# Patient Record
Sex: Female | Born: 1935
Health system: Southern US, Community
[De-identification: ages and names within clinical notes are randomized; demographics above are authoritative.]

## PROBLEM LIST (undated history)

## (undated) DIAGNOSIS — H9319 Tinnitus, unspecified ear: Secondary | ICD-10-CM

## (undated) DIAGNOSIS — I509 Heart failure, unspecified: Secondary | ICD-10-CM

## (undated) DIAGNOSIS — I4819 Other persistent atrial fibrillation: Secondary | ICD-10-CM

## (undated) DIAGNOSIS — I10 Essential (primary) hypertension: Secondary | ICD-10-CM

## (undated) HISTORY — PX: OTHER SURGICAL HISTORY: SHX169

## (undated) HISTORY — DX: Other persistent atrial fibrillation: I48.19

## (undated) HISTORY — PX: ATRIAL FIBRILLATION ABLATION: EP1191

## (undated) HISTORY — DX: Essential (primary) hypertension: I10

## (undated) HISTORY — PX: APPENDECTOMY: SHX54

## (undated) HISTORY — DX: Tinnitus, unspecified ear: H93.19

---

## 2015-08-03 DIAGNOSIS — I480 Paroxysmal atrial fibrillation: Secondary | ICD-10-CM | POA: Diagnosis not present

## 2015-08-31 DIAGNOSIS — I480 Paroxysmal atrial fibrillation: Secondary | ICD-10-CM | POA: Diagnosis not present

## 2015-10-08 DIAGNOSIS — H348392 Tributary (branch) retinal vein occlusion, unspecified eye, stable: Secondary | ICD-10-CM | POA: Diagnosis not present

## 2015-10-08 DIAGNOSIS — H04123 Dry eye syndrome of bilateral lacrimal glands: Secondary | ICD-10-CM | POA: Diagnosis not present

## 2015-10-08 DIAGNOSIS — H26493 Other secondary cataract, bilateral: Secondary | ICD-10-CM | POA: Diagnosis not present

## 2015-10-18 DIAGNOSIS — I480 Paroxysmal atrial fibrillation: Secondary | ICD-10-CM | POA: Diagnosis not present

## 2015-10-28 DIAGNOSIS — I482 Chronic atrial fibrillation: Secondary | ICD-10-CM | POA: Diagnosis not present

## 2015-10-28 DIAGNOSIS — I10 Essential (primary) hypertension: Secondary | ICD-10-CM | POA: Diagnosis not present

## 2015-10-28 DIAGNOSIS — N3281 Overactive bladder: Secondary | ICD-10-CM | POA: Diagnosis not present

## 2015-10-28 DIAGNOSIS — F329 Major depressive disorder, single episode, unspecified: Secondary | ICD-10-CM | POA: Diagnosis not present

## 2015-10-29 DIAGNOSIS — I10 Essential (primary) hypertension: Secondary | ICD-10-CM | POA: Diagnosis not present

## 2015-10-29 DIAGNOSIS — I480 Paroxysmal atrial fibrillation: Secondary | ICD-10-CM | POA: Diagnosis not present

## 2015-10-29 DIAGNOSIS — I483 Typical atrial flutter: Secondary | ICD-10-CM | POA: Diagnosis not present

## 2015-11-17 DIAGNOSIS — I480 Paroxysmal atrial fibrillation: Secondary | ICD-10-CM | POA: Diagnosis not present

## 2015-11-19 DIAGNOSIS — H26493 Other secondary cataract, bilateral: Secondary | ICD-10-CM | POA: Diagnosis not present

## 2015-12-14 DIAGNOSIS — I480 Paroxysmal atrial fibrillation: Secondary | ICD-10-CM | POA: Diagnosis not present

## 2015-12-28 DIAGNOSIS — H26493 Other secondary cataract, bilateral: Secondary | ICD-10-CM | POA: Diagnosis not present

## 2015-12-28 DIAGNOSIS — H04123 Dry eye syndrome of bilateral lacrimal glands: Secondary | ICD-10-CM | POA: Diagnosis not present

## 2016-01-12 DIAGNOSIS — I480 Paroxysmal atrial fibrillation: Secondary | ICD-10-CM | POA: Diagnosis not present

## 2016-02-11 DIAGNOSIS — R300 Dysuria: Secondary | ICD-10-CM | POA: Diagnosis not present

## 2016-02-11 DIAGNOSIS — N390 Urinary tract infection, site not specified: Secondary | ICD-10-CM | POA: Diagnosis not present

## 2016-02-11 DIAGNOSIS — F329 Major depressive disorder, single episode, unspecified: Secondary | ICD-10-CM | POA: Diagnosis not present

## 2016-02-11 DIAGNOSIS — I482 Chronic atrial fibrillation: Secondary | ICD-10-CM | POA: Diagnosis not present

## 2016-02-11 DIAGNOSIS — I10 Essential (primary) hypertension: Secondary | ICD-10-CM | POA: Diagnosis not present

## 2016-03-01 DIAGNOSIS — R740 Nonspecific elevation of levels of transaminase and lactic acid dehydrogenase [LDH]: Secondary | ICD-10-CM | POA: Diagnosis not present

## 2016-03-01 DIAGNOSIS — I480 Paroxysmal atrial fibrillation: Secondary | ICD-10-CM | POA: Diagnosis not present

## 2016-03-09 DIAGNOSIS — H4312 Vitreous hemorrhage, left eye: Secondary | ICD-10-CM | POA: Diagnosis not present

## 2016-03-09 DIAGNOSIS — Z961 Presence of intraocular lens: Secondary | ICD-10-CM | POA: Diagnosis not present

## 2016-03-09 DIAGNOSIS — H348321 Tributary (branch) retinal vein occlusion, left eye, with retinal neovascularization: Secondary | ICD-10-CM | POA: Diagnosis not present

## 2016-03-09 DIAGNOSIS — H353131 Nonexudative age-related macular degeneration, bilateral, early dry stage: Secondary | ICD-10-CM | POA: Diagnosis not present

## 2016-03-16 DIAGNOSIS — D1803 Hemangioma of intra-abdominal structures: Secondary | ICD-10-CM | POA: Diagnosis not present

## 2016-03-16 DIAGNOSIS — R109 Unspecified abdominal pain: Secondary | ICD-10-CM | POA: Diagnosis not present

## 2016-03-16 DIAGNOSIS — D490 Neoplasm of unspecified behavior of digestive system: Secondary | ICD-10-CM | POA: Diagnosis not present

## 2016-03-16 DIAGNOSIS — Z96643 Presence of artificial hip joint, bilateral: Secondary | ICD-10-CM | POA: Diagnosis not present

## 2016-03-16 DIAGNOSIS — D689 Coagulation defect, unspecified: Secondary | ICD-10-CM | POA: Diagnosis not present

## 2016-03-16 DIAGNOSIS — Z743 Need for continuous supervision: Secondary | ICD-10-CM | POA: Diagnosis not present

## 2016-03-16 DIAGNOSIS — I4891 Unspecified atrial fibrillation: Secondary | ICD-10-CM | POA: Diagnosis not present

## 2016-03-16 DIAGNOSIS — R531 Weakness: Secondary | ICD-10-CM | POA: Diagnosis not present

## 2016-03-16 DIAGNOSIS — R509 Fever, unspecified: Secondary | ICD-10-CM | POA: Diagnosis not present

## 2016-03-16 DIAGNOSIS — R110 Nausea: Secondary | ICD-10-CM | POA: Diagnosis not present

## 2016-03-16 DIAGNOSIS — D72829 Elevated white blood cell count, unspecified: Secondary | ICD-10-CM | POA: Diagnosis not present

## 2016-03-16 DIAGNOSIS — R740 Nonspecific elevation of levels of transaminase and lactic acid dehydrogenase [LDH]: Secondary | ICD-10-CM | POA: Diagnosis not present

## 2016-03-16 DIAGNOSIS — Z9181 History of falling: Secondary | ICD-10-CM | POA: Diagnosis not present

## 2016-03-16 DIAGNOSIS — R0600 Dyspnea, unspecified: Secondary | ICD-10-CM | POA: Diagnosis not present

## 2016-03-16 DIAGNOSIS — I10 Essential (primary) hypertension: Secondary | ICD-10-CM | POA: Diagnosis not present

## 2016-03-16 DIAGNOSIS — J9811 Atelectasis: Secondary | ICD-10-CM | POA: Diagnosis not present

## 2016-03-16 DIAGNOSIS — R748 Abnormal levels of other serum enzymes: Secondary | ICD-10-CM | POA: Diagnosis not present

## 2016-03-16 DIAGNOSIS — I2510 Atherosclerotic heart disease of native coronary artery without angina pectoris: Secondary | ICD-10-CM | POA: Diagnosis not present

## 2016-03-16 DIAGNOSIS — K7589 Other specified inflammatory liver diseases: Secondary | ICD-10-CM | POA: Diagnosis not present

## 2016-03-16 DIAGNOSIS — F329 Major depressive disorder, single episode, unspecified: Secondary | ICD-10-CM | POA: Diagnosis not present

## 2016-03-16 DIAGNOSIS — R7989 Other specified abnormal findings of blood chemistry: Secondary | ICD-10-CM | POA: Diagnosis not present

## 2016-03-16 DIAGNOSIS — R932 Abnormal findings on diagnostic imaging of liver and biliary tract: Secondary | ICD-10-CM | POA: Diagnosis not present

## 2016-03-16 DIAGNOSIS — B349 Viral infection, unspecified: Secondary | ICD-10-CM | POA: Diagnosis not present

## 2016-03-24 DIAGNOSIS — M79674 Pain in right toe(s): Secondary | ICD-10-CM | POA: Diagnosis not present

## 2016-03-24 DIAGNOSIS — H353131 Nonexudative age-related macular degeneration, bilateral, early dry stage: Secondary | ICD-10-CM | POA: Diagnosis not present

## 2016-03-24 DIAGNOSIS — B351 Tinea unguium: Secondary | ICD-10-CM | POA: Diagnosis not present

## 2016-03-24 DIAGNOSIS — I739 Peripheral vascular disease, unspecified: Secondary | ICD-10-CM | POA: Diagnosis not present

## 2016-03-24 DIAGNOSIS — H348321 Tributary (branch) retinal vein occlusion, left eye, with retinal neovascularization: Secondary | ICD-10-CM | POA: Diagnosis not present

## 2016-03-24 DIAGNOSIS — H4312 Vitreous hemorrhage, left eye: Secondary | ICD-10-CM | POA: Diagnosis not present

## 2016-03-24 DIAGNOSIS — M79675 Pain in left toe(s): Secondary | ICD-10-CM | POA: Diagnosis not present

## 2016-03-24 DIAGNOSIS — Z7901 Long term (current) use of anticoagulants: Secondary | ICD-10-CM | POA: Diagnosis not present

## 2016-03-29 DIAGNOSIS — F329 Major depressive disorder, single episode, unspecified: Secondary | ICD-10-CM | POA: Diagnosis not present

## 2016-03-29 DIAGNOSIS — I482 Chronic atrial fibrillation: Secondary | ICD-10-CM | POA: Diagnosis not present

## 2016-03-29 DIAGNOSIS — I480 Paroxysmal atrial fibrillation: Secondary | ICD-10-CM | POA: Diagnosis not present

## 2016-03-29 DIAGNOSIS — R296 Repeated falls: Secondary | ICD-10-CM | POA: Diagnosis not present

## 2016-03-29 DIAGNOSIS — R740 Nonspecific elevation of levels of transaminase and lactic acid dehydrogenase [LDH]: Secondary | ICD-10-CM | POA: Diagnosis not present

## 2016-03-29 DIAGNOSIS — I10 Essential (primary) hypertension: Secondary | ICD-10-CM | POA: Diagnosis not present

## 2016-03-29 DIAGNOSIS — R2681 Unsteadiness on feet: Secondary | ICD-10-CM | POA: Diagnosis not present

## 2016-04-07 DIAGNOSIS — H4312 Vitreous hemorrhage, left eye: Secondary | ICD-10-CM | POA: Diagnosis not present

## 2016-04-07 DIAGNOSIS — H348321 Tributary (branch) retinal vein occlusion, left eye, with retinal neovascularization: Secondary | ICD-10-CM | POA: Diagnosis not present

## 2016-04-07 DIAGNOSIS — H353131 Nonexudative age-related macular degeneration, bilateral, early dry stage: Secondary | ICD-10-CM | POA: Diagnosis not present

## 2016-04-26 DIAGNOSIS — H348321 Tributary (branch) retinal vein occlusion, left eye, with retinal neovascularization: Secondary | ICD-10-CM | POA: Diagnosis not present

## 2016-04-26 DIAGNOSIS — H5372 Impaired contrast sensitivity: Secondary | ICD-10-CM | POA: Diagnosis not present

## 2016-04-26 DIAGNOSIS — H5371 Glare sensitivity: Secondary | ICD-10-CM | POA: Diagnosis not present

## 2016-04-26 DIAGNOSIS — H353131 Nonexudative age-related macular degeneration, bilateral, early dry stage: Secondary | ICD-10-CM | POA: Diagnosis not present

## 2016-04-26 DIAGNOSIS — H04123 Dry eye syndrome of bilateral lacrimal glands: Secondary | ICD-10-CM | POA: Diagnosis not present

## 2016-05-03 DIAGNOSIS — I480 Paroxysmal atrial fibrillation: Secondary | ICD-10-CM | POA: Diagnosis not present

## 2016-05-18 DIAGNOSIS — R351 Nocturia: Secondary | ICD-10-CM | POA: Diagnosis not present

## 2016-05-18 DIAGNOSIS — I482 Chronic atrial fibrillation: Secondary | ICD-10-CM | POA: Diagnosis not present

## 2016-05-18 DIAGNOSIS — I10 Essential (primary) hypertension: Secondary | ICD-10-CM | POA: Diagnosis not present

## 2016-05-18 DIAGNOSIS — F329 Major depressive disorder, single episode, unspecified: Secondary | ICD-10-CM | POA: Diagnosis not present

## 2016-06-09 DIAGNOSIS — I480 Paroxysmal atrial fibrillation: Secondary | ICD-10-CM | POA: Diagnosis not present

## 2016-07-04 DIAGNOSIS — L821 Other seborrheic keratosis: Secondary | ICD-10-CM | POA: Diagnosis not present

## 2016-07-04 DIAGNOSIS — D485 Neoplasm of uncertain behavior of skin: Secondary | ICD-10-CM | POA: Diagnosis not present

## 2016-07-04 DIAGNOSIS — L570 Actinic keratosis: Secondary | ICD-10-CM | POA: Diagnosis not present

## 2016-07-04 DIAGNOSIS — C44611 Basal cell carcinoma of skin of unspecified upper limb, including shoulder: Secondary | ICD-10-CM | POA: Diagnosis not present

## 2016-07-06 DIAGNOSIS — I480 Paroxysmal atrial fibrillation: Secondary | ICD-10-CM | POA: Diagnosis not present

## 2016-08-07 DIAGNOSIS — C44611 Basal cell carcinoma of skin of unspecified upper limb, including shoulder: Secondary | ICD-10-CM | POA: Diagnosis not present

## 2016-08-07 DIAGNOSIS — D1801 Hemangioma of skin and subcutaneous tissue: Secondary | ICD-10-CM | POA: Diagnosis not present

## 2016-08-07 DIAGNOSIS — L821 Other seborrheic keratosis: Secondary | ICD-10-CM | POA: Diagnosis not present

## 2016-08-11 DIAGNOSIS — I480 Paroxysmal atrial fibrillation: Secondary | ICD-10-CM | POA: Diagnosis not present

## 2016-08-11 DIAGNOSIS — I482 Chronic atrial fibrillation: Secondary | ICD-10-CM | POA: Diagnosis not present

## 2016-08-11 DIAGNOSIS — I10 Essential (primary) hypertension: Secondary | ICD-10-CM | POA: Diagnosis not present

## 2016-08-11 DIAGNOSIS — N3281 Overactive bladder: Secondary | ICD-10-CM | POA: Diagnosis not present

## 2016-09-11 DIAGNOSIS — I480 Paroxysmal atrial fibrillation: Secondary | ICD-10-CM | POA: Diagnosis not present

## 2016-09-13 DIAGNOSIS — R5381 Other malaise: Secondary | ICD-10-CM | POA: Diagnosis not present

## 2016-09-13 DIAGNOSIS — Z87891 Personal history of nicotine dependence: Secondary | ICD-10-CM | POA: Diagnosis not present

## 2016-09-13 DIAGNOSIS — I4892 Unspecified atrial flutter: Secondary | ICD-10-CM | POA: Diagnosis not present

## 2016-09-13 DIAGNOSIS — M4800 Spinal stenosis, site unspecified: Secondary | ICD-10-CM | POA: Diagnosis not present

## 2016-09-13 DIAGNOSIS — I480 Paroxysmal atrial fibrillation: Secondary | ICD-10-CM | POA: Diagnosis not present

## 2016-09-13 DIAGNOSIS — R791 Abnormal coagulation profile: Secondary | ICD-10-CM | POA: Diagnosis not present

## 2016-09-13 DIAGNOSIS — J40 Bronchitis, not specified as acute or chronic: Secondary | ICD-10-CM | POA: Diagnosis not present

## 2016-09-13 DIAGNOSIS — I110 Hypertensive heart disease with heart failure: Secondary | ICD-10-CM | POA: Diagnosis not present

## 2016-09-13 DIAGNOSIS — I5033 Acute on chronic diastolic (congestive) heart failure: Secondary | ICD-10-CM | POA: Diagnosis not present

## 2016-09-13 DIAGNOSIS — Z743 Need for continuous supervision: Secondary | ICD-10-CM | POA: Diagnosis not present

## 2016-09-13 DIAGNOSIS — Z7901 Long term (current) use of anticoagulants: Secondary | ICD-10-CM | POA: Diagnosis not present

## 2016-09-13 DIAGNOSIS — M810 Age-related osteoporosis without current pathological fracture: Secondary | ICD-10-CM | POA: Diagnosis not present

## 2016-09-13 DIAGNOSIS — I509 Heart failure, unspecified: Secondary | ICD-10-CM | POA: Diagnosis not present

## 2016-09-13 DIAGNOSIS — Z8711 Personal history of peptic ulcer disease: Secondary | ICD-10-CM | POA: Diagnosis not present

## 2016-09-14 DIAGNOSIS — I517 Cardiomegaly: Secondary | ICD-10-CM | POA: Diagnosis not present

## 2016-09-14 DIAGNOSIS — I081 Rheumatic disorders of both mitral and tricuspid valves: Secondary | ICD-10-CM | POA: Diagnosis not present

## 2016-09-14 DIAGNOSIS — I272 Pulmonary hypertension, unspecified: Secondary | ICD-10-CM | POA: Diagnosis not present

## 2016-09-22 DIAGNOSIS — M79675 Pain in left toe(s): Secondary | ICD-10-CM | POA: Diagnosis not present

## 2016-09-22 DIAGNOSIS — I739 Peripheral vascular disease, unspecified: Secondary | ICD-10-CM | POA: Diagnosis not present

## 2016-09-22 DIAGNOSIS — B351 Tinea unguium: Secondary | ICD-10-CM | POA: Diagnosis not present

## 2016-09-22 DIAGNOSIS — M79674 Pain in right toe(s): Secondary | ICD-10-CM | POA: Diagnosis not present

## 2016-09-22 DIAGNOSIS — Z7901 Long term (current) use of anticoagulants: Secondary | ICD-10-CM | POA: Diagnosis not present

## 2016-09-29 DIAGNOSIS — I10 Essential (primary) hypertension: Secondary | ICD-10-CM | POA: Diagnosis not present

## 2016-09-29 DIAGNOSIS — I480 Paroxysmal atrial fibrillation: Secondary | ICD-10-CM | POA: Diagnosis not present

## 2016-09-29 DIAGNOSIS — I483 Typical atrial flutter: Secondary | ICD-10-CM | POA: Diagnosis not present

## 2016-11-20 DIAGNOSIS — I480 Paroxysmal atrial fibrillation: Secondary | ICD-10-CM | POA: Diagnosis not present

## 2016-11-20 DIAGNOSIS — H9313 Tinnitus, bilateral: Secondary | ICD-10-CM | POA: Diagnosis not present

## 2016-11-20 DIAGNOSIS — F330 Major depressive disorder, recurrent, mild: Secondary | ICD-10-CM | POA: Diagnosis not present

## 2016-11-20 DIAGNOSIS — I10 Essential (primary) hypertension: Secondary | ICD-10-CM | POA: Diagnosis not present

## 2016-12-22 DIAGNOSIS — F331 Major depressive disorder, recurrent, moderate: Secondary | ICD-10-CM | POA: Diagnosis not present

## 2016-12-26 DIAGNOSIS — I480 Paroxysmal atrial fibrillation: Secondary | ICD-10-CM | POA: Diagnosis not present

## 2016-12-26 DIAGNOSIS — I10 Essential (primary) hypertension: Secondary | ICD-10-CM | POA: Diagnosis not present

## 2016-12-26 DIAGNOSIS — F330 Major depressive disorder, recurrent, mild: Secondary | ICD-10-CM | POA: Diagnosis not present

## 2016-12-26 DIAGNOSIS — G250 Essential tremor: Secondary | ICD-10-CM | POA: Diagnosis not present

## 2017-01-01 DIAGNOSIS — I10 Essential (primary) hypertension: Secondary | ICD-10-CM | POA: Diagnosis not present

## 2017-01-01 DIAGNOSIS — I480 Paroxysmal atrial fibrillation: Secondary | ICD-10-CM | POA: Diagnosis not present

## 2017-01-08 ENCOUNTER — Ambulatory Visit (HOSPITAL_COMMUNITY)
Admission: RE | Admit: 2017-01-08 | Discharge: 2017-01-08 | Disposition: A | Discharge status: Home / Self Care | Payer: Medicare Other | Source: Ambulatory Visit | Attending: Nurse Practitioner | Admitting: Nurse Practitioner

## 2017-01-08 ENCOUNTER — Encounter (HOSPITAL_COMMUNITY): Payer: Self-pay | Admitting: Nurse Practitioner

## 2017-01-08 VITALS — BP 90/58 | HR 73 | Ht 63.0 in | Wt 151.2 lb

## 2017-01-08 DIAGNOSIS — Z888 Allergy status to other drugs, medicaments and biological substances status: Secondary | ICD-10-CM | POA: Insufficient documentation

## 2017-01-08 DIAGNOSIS — Z7901 Long term (current) use of anticoagulants: Secondary | ICD-10-CM | POA: Diagnosis not present

## 2017-01-08 DIAGNOSIS — I4891 Unspecified atrial fibrillation: Secondary | ICD-10-CM | POA: Diagnosis not present

## 2017-01-08 DIAGNOSIS — I4892 Unspecified atrial flutter: Secondary | ICD-10-CM | POA: Diagnosis not present

## 2017-01-08 DIAGNOSIS — I481 Persistent atrial fibrillation: Secondary | ICD-10-CM

## 2017-01-08 DIAGNOSIS — Z9889 Other specified postprocedural states: Secondary | ICD-10-CM | POA: Diagnosis not present

## 2017-01-08 DIAGNOSIS — Z87891 Personal history of nicotine dependence: Secondary | ICD-10-CM | POA: Insufficient documentation

## 2017-01-08 DIAGNOSIS — Z96643 Presence of artificial hip joint, bilateral: Secondary | ICD-10-CM | POA: Diagnosis not present

## 2017-01-08 DIAGNOSIS — Z79899 Other long term (current) drug therapy: Secondary | ICD-10-CM | POA: Insufficient documentation

## 2017-01-08 DIAGNOSIS — I4819 Other persistent atrial fibrillation: Secondary | ICD-10-CM

## 2017-01-08 DIAGNOSIS — I509 Heart failure, unspecified: Secondary | ICD-10-CM | POA: Insufficient documentation

## 2017-01-08 HISTORY — DX: Heart failure, unspecified: I50.9

## 2017-01-08 LAB — CBC
HCT: 48 % — ABNORMAL HIGH (ref 36.0–46.0)
Hemoglobin: 15.8 g/dL — ABNORMAL HIGH (ref 12.0–15.0)
MCH: 30.4 pg (ref 26.0–34.0)
MCHC: 32.9 g/dL (ref 30.0–36.0)
MCV: 92.3 fL (ref 78.0–100.0)
Platelets: 249 10*3/uL (ref 150–400)
RBC: 5.2 MIL/uL — ABNORMAL HIGH (ref 3.87–5.11)
RDW: 14.4 % (ref 11.5–15.5)
WBC: 7.1 10*3/uL (ref 4.0–10.5)

## 2017-01-08 LAB — COMPREHENSIVE METABOLIC PANEL
ALT: 14 U/L (ref 14–54)
AST: 22 U/L (ref 15–41)
Albumin: 3.5 g/dL (ref 3.5–5.0)
Alkaline Phosphatase: 71 U/L (ref 38–126)
Anion gap: 6 (ref 5–15)
BUN: 22 mg/dL — ABNORMAL HIGH (ref 6–20)
CO2: 31 mmol/L (ref 22–32)
Calcium: 9 mg/dL (ref 8.9–10.3)
Chloride: 102 mmol/L (ref 101–111)
Creatinine, Ser: 0.91 mg/dL (ref 0.44–1.00)
GFR calc Af Amer: >60 mL/min (ref >60)
GFR calc non Af Amer: 55 mL/min — ABNORMAL LOW (ref >60)
Glucose, Bld: 86 mg/dL (ref 65–99)
Potassium: 3.7 mmol/L (ref 3.5–5.1)
Sodium: 139 mmol/L (ref 135–145)
Total Bilirubin: 1 mg/dL (ref 0.3–1.2)
Total Protein: 6.5 g/dL (ref 6.5–8.1)

## 2017-01-08 LAB — TSH: TSH: 3.373 u[IU]/mL (ref 0.350–4.500)

## 2017-01-08 NOTE — Patient Instructions (Signed)
pulmonary function tests ---Arrive at the main entrance of the hospital and ask to be taken to respiratory ---Arrive 15 mins prior to appointment time. ---No inhalers, smoking or caffeine 4 hours prior to the test ---you may eat and drink (other than caffeine)  Butch Penny will be in touch with you once all results of testing.

## 2017-01-09 NOTE — Progress Notes (Signed)
Primary Care Physician: Lajean Manes, MD Referring Physician: Dr. Wilhelmina Mcardle Stephanie Ritter is a 83 y.o. female with a h/o paroxysmal afib with remote afib ablation in the afib clinic for evaluation. The history comes mostly form the daughter. Her parents have recently moved here in the last couple of months from another state due to both having declinie in health.. The pt was in the hospital in February with ? Afib/fluid overload and per the daughter's history may have been in afib since that time with associated fatigue and shortness of breath. Her afib has been had to mange locally due to hypotension with rate control drugs. She is on eliquis with a chadsvasc score of at least 5.  Today, she denies symptoms of palpitations, chest pain, shortness of breath, orthopnea, PND, lower extremity edema, dizziness, presyncope, syncope, or neurologic sequela. Positive for fatigue and shortness of breath.The patient is tolerating medications without difficulties and is otherwise without complaint today.   Past Medical History:  Diagnosis Date  . Atrial fibrillation ([place])   . CHF (congestive heart failure) ([place])   . High blood pressure    Past Surgical History:  Procedure Laterality Date  . APPENDECTOMY     60 years ago  . ATRIAL FIBRILLATION ABLATION N/A    8 years ago  . hip replacement Bilateral    4 and 8 years ago    Current Outpatient Prescriptions  Medication Sig Dispense Refill  . apixaban (ELIQUIS) 5 MG TABS tablet Take 5 mg by mouth 2 (two) times daily.    Marland Kitchen diltiazem (CARDIZEM CD) 300 MG 24 hr capsule Take 300 mg by mouth daily.    Marland Kitchen escitalopram (LEXAPRO) 20 MG tablet 1 tablet daily.  11  . furosemide (LASIX) 40 MG tablet Take 40 mg by mouth daily.    . metoprolol tartrate (LOPRESSOR) 25 MG tablet Take 25 mg by mouth 2 (two) times daily.    . Multiple Vitamins-Minerals (PRESERVISION/LUTEIN) CAPS Take 1 capsule by mouth daily.     No current facility-administered medications  for this encounter.     Allergies  Allergen Reactions  . Asa [Aspirin]     Excessive bleeding    Social History   Social History  . Marital status: Married    Spouse name: N/A  . Number of children: N/A  . Years of education: N/A   Occupational History  . Not on file.   Social History Main Topics  . Smoking status: Former Smoker    Types: Cigarettes  . Smokeless tobacco: Never Used     Comment: Pt quit 50+ years ago  . Alcohol use 0.6 oz/week    1 Glasses of wine per week     Comment: occasionally  . Drug use: Unknown  . Sexual activity: Not on file   Other Topics Concern  . Not on file   Social History Narrative  . No narrative on file    No family history on file.  ROS- All systems are reviewed and negative except as per the HPI above  Physical Exam: Vitals:   01/08/17 1035  BP: (!) 90/58  Pulse: 73  Weight: 151 lb 3.2 oz (68.6 kg)  Height: 5\' 3"  (1.6 m)   Wt Readings from Last 3 Encounters:  01/08/17 151 lb 3.2 oz (68.6 kg)    Labs: Lab Results  Component Value Date   NA 139 01/08/2017   K 3.7 01/08/2017   CL 102 01/08/2017   CO2 31 01/08/2017  GLUCOSE 86 01/08/2017   BUN 22 (H) 01/08/2017   CREATININE 0.91 01/08/2017   CALCIUM 9.0 01/08/2017   No results found for: INR No results found for: CHOL, HDL, LDLCALC, TRIG   GEN- The patient is well appearing, alert and oriented x 3 today.   Head- normocephalic, atraumatic Eyes-  Sclera clear, conjunctiva pink Ears- hearing intact Oropharynx- clear Neck- supple, no JVP Lymph- no cervical lymphadenopathy Lungs- Clear to ausculation bilaterally, normal work of breathing Heart- irregular rate and rhythm, no murmurs, rubs or gallops, PMI not laterally displaced GI- soft, NT, ND, + BS Extremities- no clubbing, cyanosis, or edema MS- no significant deformity or atrophy Skin- no rash or lesion Psych- euthymic mood, full affect Neuro- strength and sensation are intact  EKG-aflutter with  variable AV block, v rate 73 bpm, qrs int 76 ms, qtc 425 ms Records reviewed from pcp    Assessment and Plan: 1. Afib/flutter Tolerating  poorly with hypotension, fatigue and DOE Discussed with daughter, pt and her husband trying to restore SR with options of amiodarone or tikosyn They are leaning toward amiodarone Will get baseline line labs including liver panel and TSH and schedule for PFT's, if baseline studies are wnl, will start amiodarone load 200 mg bid x 2 weeks with 200 mg a day x another two weeks and then plan for cardioversion Reminded not to miss any eliquis Medical records are being requested from her previous cardiologist, would like to see her echo results and full cardiac history In the interim continue current rate control  Will be back in contact when PFT results are known and start AAD at that time  Butch Penny C. Rilei Kravitz, [facility] [address] [place], [place] [address] [phone]

## 2017-01-12 ENCOUNTER — Ambulatory Visit (HOSPITAL_COMMUNITY)
Admission: RE | Admit: 2017-01-12 | Discharge: 2017-01-12 | Disposition: A | Discharge status: Home / Self Care | Payer: Medicare Other | Source: Ambulatory Visit | Attending: Nurse Practitioner | Admitting: Nurse Practitioner

## 2017-01-12 DIAGNOSIS — I481 Persistent atrial fibrillation: Secondary | ICD-10-CM | POA: Insufficient documentation

## 2017-01-12 DIAGNOSIS — I4819 Other persistent atrial fibrillation: Secondary | ICD-10-CM

## 2017-01-12 LAB — PULMONARY FUNCTION TEST
DL/VA % pred: 71 %
DL/VA: 3.28 ml/min/mmHg/L
DLCO cor % pred: 56 %
DLCO cor: 12.48 ml/min/mmHg
DLCO unc % pred: 59 %
DLCO unc: 13.31 ml/min/mmHg
FEF 25-75 Post: 2.39 L/sec
FEF 25-75 Pre: 1.71 L/sec
FEF2575-%Change-Post: 39 %
FEF2575-%Pred-Post: 268 %
FEF2575-%Pred-Pre: 192 %
FEV1-%Change-Post: 5 %
FEV1-%Pred-Post: 124 %
FEV1-%Pred-Pre: 118 %
FEV1-Post: 1.87 L
FEV1-Pre: 1.78 L
FEV1FVC-%Change-Post: 2 %
FEV1FVC-%Pred-Pre: 113 %
FEV6-%Change-Post: 2 %
FEV6-%Pred-Post: 117 %
FEV6-%Pred-Pre: 114 %
FEV6-Post: 2.24 L
FEV6-Pre: 2.18 L
FEV6FVC-%Pred-Post: 107 %
FEV6FVC-%Pred-Pre: 107 %
FVC-%Change-Post: 2 %
FVC-%Pred-Post: 108 %
FVC-%Pred-Pre: 105 %
FVC-Post: 2.24 L
FVC-Pre: 2.18 L
Post FEV1/FVC ratio: 84 %
Post FEV6/FVC ratio: 100 %
Pre FEV1/FVC ratio: 82 %
Pre FEV6/FVC Ratio: 100 %
RV % pred: 209 %
RV: 5.19 L
TLC % pred: 153 %
TLC: 7.4 L

## 2017-01-12 MED ORDER — ALBUTEROL SULFATE (2.5 MG/3ML) 0.083% IN NEBU
2.5000 mg | INHALATION_SOLUTION | Freq: Once | RESPIRATORY_TRACT | Status: AC
  Administered 2017-01-12: 2.5 mg via RESPIRATORY_TRACT

## 2017-01-16 ENCOUNTER — Encounter (HOSPITAL_COMMUNITY): Payer: Medicare Other

## 2017-01-18 ENCOUNTER — Telehealth: Payer: Self-pay | Admitting: Cardiovascular Disease

## 2017-01-18 NOTE — Telephone Encounter (Signed)
Received records from [facility] for appointment on 01/26/17 with Dr Gwenlyn Found.  Records put with Dr Kennon Holter schedule for 01/26/17. lp

## 2017-01-19 DIAGNOSIS — D1801 Hemangioma of skin and subcutaneous tissue: Secondary | ICD-10-CM | POA: Diagnosis not present

## 2017-01-19 DIAGNOSIS — L820 Inflamed seborrheic keratosis: Secondary | ICD-10-CM | POA: Diagnosis not present

## 2017-01-19 DIAGNOSIS — L821 Other seborrheic keratosis: Secondary | ICD-10-CM | POA: Diagnosis not present

## 2017-01-19 DIAGNOSIS — L570 Actinic keratosis: Secondary | ICD-10-CM | POA: Diagnosis not present

## 2017-01-22 ENCOUNTER — Encounter (HOSPITAL_COMMUNITY): Payer: Self-pay | Admitting: Nurse Practitioner

## 2017-01-22 ENCOUNTER — Ambulatory Visit (HOSPITAL_COMMUNITY)
Admission: RE | Admit: 2017-01-22 | Discharge: 2017-01-22 | Disposition: A | Discharge status: Home / Self Care | Payer: Medicare Other | Source: Ambulatory Visit | Attending: Nurse Practitioner | Admitting: Nurse Practitioner

## 2017-01-22 VITALS — BP 92/60 | HR 65 | Ht 63.0 in | Wt 153.2 lb

## 2017-01-22 DIAGNOSIS — Z96643 Presence of artificial hip joint, bilateral: Secondary | ICD-10-CM | POA: Diagnosis not present

## 2017-01-22 DIAGNOSIS — R0602 Shortness of breath: Secondary | ICD-10-CM

## 2017-01-22 DIAGNOSIS — R942 Abnormal results of pulmonary function studies: Secondary | ICD-10-CM

## 2017-01-22 DIAGNOSIS — I4892 Unspecified atrial flutter: Secondary | ICD-10-CM | POA: Insufficient documentation

## 2017-01-22 DIAGNOSIS — I4891 Unspecified atrial fibrillation: Secondary | ICD-10-CM | POA: Insufficient documentation

## 2017-01-22 DIAGNOSIS — Z87891 Personal history of nicotine dependence: Secondary | ICD-10-CM | POA: Insufficient documentation

## 2017-01-22 DIAGNOSIS — I509 Heart failure, unspecified: Secondary | ICD-10-CM | POA: Insufficient documentation

## 2017-01-22 DIAGNOSIS — Z7901 Long term (current) use of anticoagulants: Secondary | ICD-10-CM | POA: Insufficient documentation

## 2017-01-22 DIAGNOSIS — J90 Pleural effusion, not elsewhere classified: Secondary | ICD-10-CM | POA: Diagnosis not present

## 2017-01-22 NOTE — Progress Notes (Signed)
Primary Care Physician: Lajean Manes, MD Referring Physician: Dr. Wilhelmina Mcardle Deitrick is a 83 y.o. female with a h/o paroxysmal afib with remote afib ablation in the afib clinic for evaluation. The history comes mostly form the daughter. Her parents have recently moved here in the last couple of months from another state due to both having declinie in health. Her husband is a retired Internist.The pt was in the hospital in February with  Afib/HF and per the daughter's history,pt may have been in afib since that time with associated fatigue and shortness of breath. Her afib has been had to manage due to hypotension with rate control drugs. She is on eliquis with a chadsvasc score of at least 5.  For f/u 6/25,received some notes from cardiologist in [place],  who stated that pt was doing ok in rate controlled afib and he thought she would be continued to be managed this way. This represented a change for the pt  when she was hospitalized in February of this year in the [place] area with heart failure and aifb. Since she has recovered from this hospitalization the daughter reports that she is more tired and winded with activities. On last visit amiodarone and tikosyn were discussed to decide if pt wanted to try either to see if her quality of life could be improved. Labs and PFT's were obtained as the family was leaning more toward amiodarone. PFT's showed moderate obstruction and she is here today for more baseline consdieration re amiodarone with walking test which pt did not desaturate and for Cxr. Now family is leaning more toward Tikosyn and will let me know in the next few days . She will be establishing with Dr. Gwenlyn Found in the next week as well.  Today, she denies symptoms of palpitations, chest pain, shortness of breath, orthopnea, PND, lower extremity edema, dizziness, presyncope, syncope, or neurologic sequela. Positive for fatigue and shortness of breath.The patient is tolerating medications  without difficulties and is otherwise without complaint today.   Past Medical History:  Diagnosis Date  . Atrial fibrillation ([place])   . CHF (congestive heart failure) ([place])   . High blood pressure    Past Surgical History:  Procedure Laterality Date  . APPENDECTOMY     60 years ago  . ATRIAL FIBRILLATION ABLATION N/A    8 years ago  . hip replacement Bilateral    4 and 8 years ago    Current Outpatient Prescriptions  Medication Sig Dispense Refill  . apixaban (ELIQUIS) 5 MG TABS tablet Take 5 mg by mouth 2 (two) times daily.    Marland Kitchen diltiazem (CARDIZEM CD) 300 MG 24 hr capsule Take 300 mg by mouth daily.    Marland Kitchen escitalopram (LEXAPRO) 20 MG tablet 1 tablet daily.  11  . furosemide (LASIX) 40 MG tablet Take 40 mg by mouth daily.    . metoprolol tartrate (LOPRESSOR) 25 MG tablet Take 25 mg by mouth 2 (two) times daily.    . Multiple Vitamins-Minerals (PRESERVISION/LUTEIN) CAPS Take 1 capsule by mouth daily.     No current facility-administered medications for this encounter.     Allergies  Allergen Reactions  . Asa [Aspirin]     Excessive bleeding    Social History   Social History  . Marital status: Married    Spouse name: N/A  . Number of children: N/A  . Years of education: N/A   Occupational History  . Not on file.   Social History Main Topics  .  Smoking status: Former Smoker    Types: Cigarettes  . Smokeless tobacco: Never Used     Comment: Pt quit 50+ years ago  . Alcohol use 0.6 oz/week    1 Glasses of wine per week     Comment: occasionally  . Drug use: Unknown  . Sexual activity: Not on file   Other Topics Concern  . Not on file   Social History Narrative  . No narrative on file    No family history on file.  ROS- All systems are reviewed and negative except as per the HPI above  Physical Exam: Vitals:   01/22/17 1535  BP: 92/60  Pulse: 65  SpO2: 97%  Weight: 153 lb 3.2 oz (69.5 kg)  Height: 5\' 3"  (1.6 m)   Wt Readings from Last 3  Encounters:  01/22/17 153 lb 3.2 oz (69.5 kg)  01/08/17 151 lb 3.2 oz (68.6 kg)    Labs: Lab Results  Component Value Date   NA 139 01/08/2017   K 3.7 01/08/2017   CL 102 01/08/2017   CO2 31 01/08/2017   GLUCOSE 86 01/08/2017   BUN 22 (H) 01/08/2017   CREATININE 0.91 01/08/2017   CALCIUM 9.0 01/08/2017   No results found for: INR No results found for: CHOL, HDL, LDLCALC, TRIG   GEN- The patient is well appearing, alert and oriented x 3 today.   Head- normocephalic, atraumatic Eyes-  Sclera clear, conjunctiva pink Ears- hearing intact Oropharynx- clear Neck- supple, no JVP Lymph- no cervical lymphadenopathy Lungs- Clear to ausculation bilaterally, normal work of breathing Heart- irregular rate and rhythm, no murmurs, rubs or gallops, PMI not laterally displaced GI- soft, NT, ND, + BS Extremities- no clubbing, cyanosis, or edema MS- no significant deformity or atrophy Skin- no rash or lesion Psych- euthymic mood, full affect Neuro- strength and sensation are intact  EKG-aflutter with variable AV block, v rate 73 bpm, qrs int 76 ms, qtc 425 ms Records reviewed from pcp    Assessment and Plan: 1. Afib/flutter Tolerating  poorly with hypotension, fatigue and DOE Discussed with daughter and pt trying to restore SR with options of amiodarone or tikosyn They are leaning toward Tikosyn at this point but still unsure, maybe amiodarone Baseline amiodarone labs including liver panel, TSH look ok if family decides to go in this direction, PFT's abnormal but probably age related. Walked today in clinic without any desaturation and CXR is pending looking for any interstital changes at baseline, if baseline studies are wnl, will start amiodarone load 200 mg bid x 2 weeks with 200 mg a day x another two weeks and then plan for cardioversion Baseline qtc look ok, drugs would have to be reviewed by PharmD and lexapro may be a concern Reminded not to miss any eliquis The daughter will  let me know in the next few days with  which drug they would like to go with In the interim continue current rate control Hopefully with AAD being started, rate control can be backed off and allow BP to climb which should help pt feel improved as well  Establish with Dr. Gwenlyn Found as planned  Geroge Baseman. Orren Pietsch, [facility] [address] [place], [place] [address] [phone]

## 2017-01-24 ENCOUNTER — Encounter: Payer: Self-pay | Admitting: *Deleted

## 2017-01-25 ENCOUNTER — Other Ambulatory Visit (HOSPITAL_COMMUNITY): Payer: Self-pay | Admitting: *Deleted

## 2017-01-25 ENCOUNTER — Telehealth: Payer: Self-pay | Admitting: Pharmacist

## 2017-01-25 MED ORDER — MAGNESIUM OXIDE 400 MG PO TABS: 400.0000 mg | ORAL_TABLET | Freq: Every day | ORAL | 3 refills | Status: DC

## 2017-01-25 MED ORDER — POTASSIUM CHLORIDE CRYS ER 20 MEQ PO TBCR: 40.0000 meq | EXTENDED_RELEASE_TABLET | Freq: Every day | ORAL | 2 refills | Status: DC

## 2017-01-25 NOTE — Telephone Encounter (Signed)
Medication list reviewed in anticipation of upcoming Tikosyn initiation. Patiente does take Lexapro which is QTc prolonging - would avoid this if possible. Would recommend touching base with PCP regarding changing to SNRI like duloxetine which does not cause any QTc prolongation.  Patient is anticoagulated on Eliquis 5mg  BID on the appropriate dose.  K is low at 3.7 and will need to repleted prior to admission - would recommend 73meq daily to bring K > 4. Mg has not been checked.  Please ensure that patient has not missed any anticoagulation doses in the 3 weeks prior to Tikosyn initiation. Patient will need to be counseled to avoid use of Benadryl while on Tikosyn and in the 2-3 days prior to Tikosyn initiation.

## 2017-01-25 NOTE — Telephone Encounter (Signed)
Spoke with daughter, Margaretha Sheffield, and informed will start her on Kdur 69meq daily and speak with her PCP to change from lexapro to different agent. Daughter was slightly hesitant to take off lexapro since medication had worked well for long time. Instructed daughter to talk with PCP for medication change and let us know if our plans needed to change. Roderic Palau NP also recommended pt be started on magnesium supplement once a day. RX sent to pharmacy and daughter understands instructions.

## 2017-01-26 ENCOUNTER — Encounter: Payer: Self-pay | Admitting: Cardiovascular Disease

## 2017-01-26 ENCOUNTER — Ambulatory Visit (INDEPENDENT_AMBULATORY_CARE_PROVIDER_SITE_OTHER): Payer: Medicare Other | Admitting: Cardiovascular Disease

## 2017-01-26 DIAGNOSIS — I481 Persistent atrial fibrillation: Secondary | ICD-10-CM | POA: Diagnosis not present

## 2017-01-26 DIAGNOSIS — I5041 Acute combined systolic (congestive) and diastolic (congestive) heart failure: Secondary | ICD-10-CM

## 2017-01-26 DIAGNOSIS — I10 Essential (primary) hypertension: Secondary | ICD-10-CM | POA: Diagnosis not present

## 2017-01-26 DIAGNOSIS — I4819 Other persistent atrial fibrillation: Secondary | ICD-10-CM | POA: Insufficient documentation

## 2017-01-26 NOTE — Assessment & Plan Note (Signed)
History of diastolic heart failure presumably from atrial fibrillation. 2-D echo performed in February showed normal LV systolic function. She does have significant dyspnea on exertion and is on an oral diuretic.

## 2017-01-26 NOTE — Progress Notes (Signed)
01/26/2017 Stephanie Ritter   September 20, 1926  [id number]  Primary Physician Lajean Manes, MD Primary Cardiologist: Lorretta Harp MD Renae Gloss  HPI:  Stephanie Ritter is a delightful thin and frail appearing 83 year old married Caucasian female mother of 3 daughters with a copy by one of her daughters, Stephanie Ritter. She moved from [place] to [place] to be closer to family because of declining health and both her and her husband who is retired Administrator, [employer] but unfortunate has dementia. She has a history of hypertension and atrial fibrillation status post ablation and [place] remotely. She apparently was hospitalized in February for CHF. She is having issues regarding rate control and blood pressure on calcium channel blockers and beta blockers. She is on Eliquis  oral anticoagulation.She complains of dyspnea on exertion but denies chest pain.   Current Outpatient Prescriptions  Medication Sig Dispense Refill  . apixaban (ELIQUIS) 5 MG TABS tablet Take 5 mg by mouth 2 (two) times daily.    Marland Kitchen diltiazem (CARDIZEM CD) 300 MG 24 hr capsule Take 300 mg by mouth daily.    Marland Kitchen escitalopram (LEXAPRO) 20 MG tablet 1 tablet daily.  11  . furosemide (LASIX) 40 MG tablet Take 40 mg by mouth daily.    . magnesium oxide (MAG-OX) 400 MG tablet Take 1 tablet (400 mg total) by mouth daily. 30 tablet 3  . metoprolol tartrate (LOPRESSOR) 25 MG tablet Take 25 mg by mouth 2 (two) times daily.    . Multiple Vitamins-Minerals (PRESERVISION/LUTEIN) CAPS Take 1 capsule by mouth daily.    . potassium chloride (K-DUR,KLOR-CON) 20 MEQ tablet Take 2 tablets (40 mEq total) by mouth daily. 60 tablet 2   No current facility-administered medications for this visit.     Allergies  Allergen Reactions  . Asa [Aspirin]     Excessive bleeding    Social History   Social History  . Marital status: Married    Spouse name: N/A  . Number of children: N/A  . Years of education: N/A   Occupational History  .  Not on file.   Social History Main Topics  . Smoking status: Former Smoker    Types: Cigarettes  . Smokeless tobacco: Never Used     Comment: Pt quit 50+ years ago  . Alcohol use 0.6 oz/week    1 Glasses of wine per week     Comment: occasionally  . Drug use: No  . Sexual activity: Not on file   Other Topics Concern  . Not on file   Social History Narrative  . No narrative on file     Review of Systems: General: negative for chills, fever, night sweats or weight changes.  Cardiovascular: negative for chest pain, dyspnea on exertion, edema, orthopnea, palpitations, paroxysmal nocturnal dyspnea or shortness of breath Dermatological: negative for rash Respiratory: negative for cough or wheezing Urologic: negative for hematuria Abdominal: negative for nausea, vomiting, diarrhea, bright red blood per rectum, melena, or hematemesis Neurologic: negative for visual changes, syncope, or dizziness All other systems reviewed and are otherwise negative except as noted above.    Blood pressure 108/70, pulse 87, height 5\' 3"  (1.6 m), weight 152 lb 6.4 oz (69.1 kg).  General appearance: alert and no distress Neck: no adenopathy, no carotid bruit, no JVD, supple, symmetrical, trachea midline and thyroid not enlarged, symmetric, no tenderness/mass/nodules Lungs: clear to auscultation bilaterally Heart: irregularly irregular rhythm Extremities: extremities normal, atraumatic, no cyanosis or edema  EKG Atrial flutter with variable ventricular response,  left axis deviation and nonspecific ST and T-wave changes. I personally reviewed this EKG.  ASSESSMENT AND PLAN:   Persistent atrial fibrillation ([place]) Ms. Pasqua was referred by Roderic Palau nurse practitioner for establishment in our practice because of symptomatic atrial fibrillation. She does have a history of atrial fibrillation ablation remotely in [place]. She is on Eliquis  oral endotracheal regulation because of a CHA2DSVASC2  score of 5. We have been apparently working with rate control which has exacerbated her relative hypotension. She is symptomatic from this. She does have dyspnea on exertion. The echo performed 09/14/16 revealed normal LV systolic function with mild to moderate MR and TR. She is on calcium channel blocker and beta blocker. There was a discussion regarding amiodarone versus Tikosyn. I'm referring her to Dr. Rayann Heman to discuss this in further options regarding rate versus rhythm control.  Essential hypertension History of essential hypertension blood pressure measured at 108/70. She is on diltiazem and metoprolol. Continue current meds at current dosing  Acute combined systolic and diastolic heart failure (HCC) History of diastolic heart failure presumably from atrial fibrillation. 2-D echo performed in February showed normal LV systolic function. She does have significant dyspnea on exertion and is on an oral diuretic.      Lorretta Harp MD FACP,FACC,FAHA, [facility] 01/26/2017 11:06 AM

## 2017-01-26 NOTE — Patient Instructions (Signed)
Medication Instructions: Your physician recommends that you continue on your current medications as directed. Please refer to the Current Medication list given to you today.   Follow-Up: You have been referred to Dr. Thompson Grayer for Atrial Fibrillation--medication therapy control.  Your physician wants you to follow-up in: 6 months with Dr. Gwenlyn Found. You will receive a reminder letter in the mail two months in advance. If you don't receive a letter, please call our office to schedule the follow-up appointment.  If you need a refill on your cardiac medications before your next appointment, please call your pharmacy.

## 2017-01-26 NOTE — Assessment & Plan Note (Signed)
History of essential hypertension blood pressure measured at 108/70. She is on diltiazem and metoprolol. Continue current meds at current dosing

## 2017-01-26 NOTE — Assessment & Plan Note (Signed)
Stephanie Ritter was referred by Roderic Palau nurse practitioner for establishment in our practice because of symptomatic atrial fibrillation. She does have a history of atrial fibrillation ablation remotely in [place]. She is on Eliquis  oral endotracheal regulation because of a CHA2DSVASC2 score of 5. We have been apparently working with rate control which has exacerbated her relative hypotension. She is symptomatic from this. She does have dyspnea on exertion. The echo performed 09/14/16 revealed normal LV systolic function with mild to moderate MR and TR. She is on calcium channel blocker and beta blocker. There was a discussion regarding amiodarone versus Tikosyn. I'm referring her to Dr. Rayann Heman to discuss this in further options regarding rate versus rhythm control.

## 2017-02-13 ENCOUNTER — Inpatient Hospital Stay (HOSPITAL_COMMUNITY): Admission: RE | Admit: 2017-02-13 | Payer: Medicare Other | Source: Ambulatory Visit | Admitting: Nurse Practitioner

## 2017-02-19 ENCOUNTER — Ambulatory Visit (INDEPENDENT_AMBULATORY_CARE_PROVIDER_SITE_OTHER): Payer: Medicare Other | Admitting: Internal Medicine

## 2017-02-19 ENCOUNTER — Other Ambulatory Visit: Payer: Self-pay | Admitting: Internal Medicine

## 2017-02-19 ENCOUNTER — Encounter: Payer: Self-pay | Admitting: Internal Medicine

## 2017-02-19 VITALS — BP 92/70 | HR 69 | Ht 63.0 in | Wt 150.4 lb

## 2017-02-19 DIAGNOSIS — I10 Essential (primary) hypertension: Secondary | ICD-10-CM

## 2017-02-19 DIAGNOSIS — I481 Persistent atrial fibrillation: Secondary | ICD-10-CM | POA: Diagnosis not present

## 2017-02-19 DIAGNOSIS — I4819 Other persistent atrial fibrillation: Secondary | ICD-10-CM

## 2017-02-19 MED ORDER — AMIODARONE HCL 200 MG PO TABS: ORAL_TABLET | ORAL | 11 refills | Status: DC

## 2017-02-19 NOTE — Progress Notes (Signed)
Electrophysiology Office Note   Date:  02/19/2017   ID:  Nimah Uphoff, DOB 1927/01/09, MRN [id number]  PCP:  Lajean Manes, MD  Cardiologist:  Dr Gwenlyn Found Primary Electrophysiologist: Thompson Grayer, MD    Chief Complaint  Patient presents with  . Atrial Fibrillation     History of Present Illness: Stephanie Ritter is a 83 y.o. female who presents today for electrophysiology evaluation.   The patient has persistent afib.  She recently moved to our area.  She underwent afib ablation about 8 years ago which helped her maintain sinus rhythm for about 5 years.  Over the past few years, afib has returned.  She has symptoms of fatigue and decreased exercise tolerance.  She is not sure if these are secondary to afib or not.  She is not very active.  Today, she denies symptoms of palpitations, chest pain orthopnea, PND, lower extremity edema, claudication, dizziness, presyncope, syncope, bleeding, or neurologic sequela. The patient is tolerating medications without difficulties and is otherwise without complaint today.    Past Medical History:  Diagnosis Date  . CHF (congestive heart failure) ([place])   . Hypertension   . Persistent atrial fibrillation ([place])   . Tinnitus    Past Surgical History:  Procedure Laterality Date  . APPENDECTOMY     60 years ago  . ATRIAL FIBRILLATION ABLATION N/A    8 years ago  . hip replacement Bilateral    4 and 8 years ago     Current Outpatient Prescriptions  Medication Sig Dispense Refill  . apixaban (ELIQUIS) 5 MG TABS tablet Take 5 mg by mouth 2 (two) times daily.    Marland Kitchen diltiazem (CARDIZEM CD) 300 MG 24 hr capsule Take 300 mg by mouth daily.    Marland Kitchen escitalopram (LEXAPRO) 20 MG tablet 1 tablet daily.  11  . furosemide (LASIX) 40 MG tablet Take 40 mg by mouth daily.    . metoprolol tartrate (LOPRESSOR) 25 MG tablet Take 25 mg by mouth 2 (two) times daily.    . Multiple Vitamins-Minerals (PRESERVISION/LUTEIN) CAPS Take 1 capsule by mouth daily.    Marland Kitchen  amiodarone (PACERONE) 200 MG tablet Take 200 mg by mouth twice daily for 7 days then take 200 mg once daily 40 tablet 11   No current facility-administered medications for this visit.     Allergies:   Asa [aspirin]   Social History:  The patient  reports that she has quit smoking. Her smoking use included Cigarettes. She has never used smokeless tobacco. She reports that she drinks about 0.6 oz of alcohol per week . She reports that she does not use drugs.   Family History:  The patient's  Family history is unknown by patient.    ROS:  Please see the history of present illness.   All other systems are personally reviewed and negative.    PHYSICAL EXAM: VS:  BP 92/70   Pulse 69   Ht 5\' 3"  (1.6 m)   Wt 150 lb 6.4 oz (68.2 kg)   SpO2 96%   BMI 26.64 kg/m  , BMI Body mass index is 26.64 kg/m. GEN: elderly and frail, in no acute distress  HEENT: normal  Neck: no JVD, carotid bruits, or masses Cardiac: iRRR; no murmurs, rubs, or gallops,no edema  Respiratory:  clear to auscultation bilaterally, normal work of breathing GI: soft, nontender, nondistended, + BS MS: diffuse muscle atrophy  Skin: warm and dry  Neuro:  Strength and sensation are intact Psych: euthymic mood, full affect  EKG:  EKG is ordered today. The ekg ordered today is personally reviewed and shows coarse afib, V rate 69 bpm, nonspecific St/T changse, Qtc 445 msec   Recent Labs: 01/08/2017: ALT 14; BUN 22; Creatinine, Ser 0.91; Hemoglobin 15.8; Platelets 249; Potassium 3.7; Sodium 139; TSH 3.373  personally reviewed   Lipid Panel  No results found for: CHOL, TRIG, HDL, CHOLHDL, VLDL, LDLCALC, LDLDIRECT personally reviewed   Wt Readings from Last 3 Encounters:  02/19/17 150 lb 6.4 oz (68.2 kg)  01/26/17 152 lb 6.4 oz (69.1 kg)  01/22/17 153 lb 3.2 oz (69.5 kg)      Other studies personally reviewed: Additional studies/ records that were reviewed today include: AF clinic notes, Dr Kennon Holter notes  Review of  the above records today demonstrates: as above   ASSESSMENT AND PLAN:  1.  Persistent afib Therapeutic strategies for afib including rate and rhythm control were discussed in detail with the patient today. Risk, benefits, and alternatives to tikosyn and amiodarone were also discussed at length.  She would prefer amiodarone. Risks of this medicine including liver, lung, thyroid toxicities, stroke, and even death were discussed.  Pt understands risks and wishes to proceed.  We will therefore start amiodarone 200mg  BID today.  In 7 days, reduce amiodarone to 200mg  daily. Importance of compliance with eliquis was stressed today. (chads2vasc score is 4) May need to stop metoprolol if her heart rates reduce further or before cardioversion. Follow-up in the AF clinic in 3 weeks.  If still in afib, would arrange cardioversion at that time.  Return to see me in 8 weeks  Follow-up with Dr Gwenlyn Found as scheduled    Current medicines are reviewed at length with the patient today.   The patient does not have concerns regarding her medicines.  The following changes were made today:  none  Labs/ tests ordered today include:  Orders Placed This Encounter  Procedures  . EKG 12-Lead     Signed, Thompson Grayer, MD  02/19/2017 1:32 PM     [place] [address] [phone] (office) [phone] (fax)

## 2017-02-19 NOTE — Telephone Encounter (Signed)
Pt's medication was resent to the correct pharmacy, pt's daughter called inquiring about the pt's amiodarone 200 mg tablet. It was sent to [facility] mail service. I informed the daughter that I would resent the medication. Daughter verbalized understanding.

## 2017-02-19 NOTE — Patient Instructions (Signed)
Medication Instructions:  Your physician has recommended you make the following change in your medication:  1) Start Amiodarone 200 mg twice daily for 7 days then decrease to 200 mg daily   Labwork: None ordered  Testing/Procedures: None ordered   Follow-Up: Your physician recommends that you schedule a follow-up appointment in: 3 weeks in the afib clinic and 8 weeks with Dr Rayann Heman   Any Other Special Instructions Will Be Listed Below (If Applicable).     If you need a refill on your cardiac medications before your next appointment, please call your pharmacy.

## 2017-02-20 ENCOUNTER — Telehealth: Payer: Self-pay | Admitting: *Deleted

## 2017-02-20 NOTE — Telephone Encounter (Signed)
Patient's daughter calling to see if we could cancel mail order Amiodarone and sent rx to local pharmacy. [facility] has already sent out rx and patient will receive within the next 3-5 business days. Patient's daughter wanted tme to let Dr. Rayann Heman and his nurse know she is not able to start the medication at this moment.

## 2017-03-01 ENCOUNTER — Telehealth: Payer: Self-pay | Admitting: Internal Medicine

## 2017-03-01 NOTE — Telephone Encounter (Signed)
Returned daughters call, DPR on file. She has been advised to keep her appointments the way that they are set up and pt should be fine, since she pushed the Afib clinic appt back a week. She verbalized understanding.

## 2017-03-01 NOTE — Telephone Encounter (Signed)
New message  Pt daughter call requesting to speak with RN. Pt daughter states pt just started new medication on Monday and she would like to know If pt would need to reschedule appt with Dr. Rayann Heman. Please call back to discuss

## 2017-03-05 ENCOUNTER — Telehealth: Payer: Self-pay | Admitting: Internal Medicine

## 2017-03-05 NOTE — Telephone Encounter (Signed)
Stephanie Ritter ( Daughter) is calling because  [name] has been really dizzy . Please call

## 2017-03-05 NOTE — Telephone Encounter (Signed)
Talked with patient's daughter - states mother has been dizzy since Friday - to the point she has not really been up this weekend. They do not have a way of checking her heart rate or blood pressure - encouraged her to invest in a bp cuff to assist with triage in the future. Pt is supposed to go to Amiodarone 200mg  once a day starting today. Appt offered for tomorrow to assess patient - daughter in agreement.

## 2017-03-06 ENCOUNTER — Ambulatory Visit (HOSPITAL_COMMUNITY)
Admission: RE | Admit: 2017-03-06 | Discharge: 2017-03-06 | Disposition: A | Discharge status: Home / Self Care | Payer: Medicare Other | Source: Ambulatory Visit | Attending: Nurse Practitioner | Admitting: Nurse Practitioner

## 2017-03-06 ENCOUNTER — Encounter (HOSPITAL_COMMUNITY): Payer: Self-pay | Admitting: Nurse Practitioner

## 2017-03-06 VITALS — BP 102/62 | HR 84 | Ht 63.0 in | Wt 149.0 lb

## 2017-03-06 DIAGNOSIS — Z96649 Presence of unspecified artificial hip joint: Secondary | ICD-10-CM | POA: Diagnosis not present

## 2017-03-06 DIAGNOSIS — I509 Heart failure, unspecified: Secondary | ICD-10-CM | POA: Diagnosis not present

## 2017-03-06 DIAGNOSIS — Z9889 Other specified postprocedural states: Secondary | ICD-10-CM | POA: Insufficient documentation

## 2017-03-06 DIAGNOSIS — Z886 Allergy status to analgesic agent status: Secondary | ICD-10-CM | POA: Insufficient documentation

## 2017-03-06 DIAGNOSIS — Z7901 Long term (current) use of anticoagulants: Secondary | ICD-10-CM | POA: Insufficient documentation

## 2017-03-06 DIAGNOSIS — I4892 Unspecified atrial flutter: Secondary | ICD-10-CM | POA: Diagnosis not present

## 2017-03-06 DIAGNOSIS — R42 Dizziness and giddiness: Secondary | ICD-10-CM | POA: Diagnosis not present

## 2017-03-06 DIAGNOSIS — I481 Persistent atrial fibrillation: Secondary | ICD-10-CM | POA: Diagnosis not present

## 2017-03-06 DIAGNOSIS — I110 Hypertensive heart disease with heart failure: Secondary | ICD-10-CM | POA: Diagnosis not present

## 2017-03-06 DIAGNOSIS — Z79899 Other long term (current) drug therapy: Secondary | ICD-10-CM | POA: Insufficient documentation

## 2017-03-06 DIAGNOSIS — Z87891 Personal history of nicotine dependence: Secondary | ICD-10-CM | POA: Diagnosis not present

## 2017-03-06 LAB — BASIC METABOLIC PANEL
Anion gap: 8 (ref 5–15)
BUN: 25 mg/dL — ABNORMAL HIGH (ref 6–20)
CO2: 29 mmol/L (ref 22–32)
Calcium: 9 mg/dL (ref 8.9–10.3)
Chloride: 102 mmol/L (ref 101–111)
Creatinine, Ser: 1.02 mg/dL — ABNORMAL HIGH (ref 0.44–1.00)
GFR calc Af Amer: 55 mL/min — ABNORMAL LOW (ref >60)
GFR calc non Af Amer: 48 mL/min — ABNORMAL LOW (ref >60)
Glucose, Bld: 97 mg/dL (ref 65–99)
Potassium: 3.8 mmol/L (ref 3.5–5.1)
Sodium: 139 mmol/L (ref 135–145)

## 2017-03-06 NOTE — Patient Instructions (Signed)
Your physician has recommended you make the following change in your medication:  1)Decrease lasix to 20mg  a day (1/2 tablet of the 40mg  tablet) Weigh daily -- first thing in morning before eating/drinking -- if notice more than 3-5lbs weight gain overnight then restart lasix back to normal dose of 40mg .

## 2017-03-06 NOTE — Progress Notes (Signed)
Primary Care Physician: Lajean Manes, MD Referring Physician: Dr. Wilhelmina Mcardle Santacroce is a 83 y.o. female with a h/o paroxysmal afib with remote afib ablation in the afib clinic for evaluation. The history comes mostly form the daughter. Her parents have recently moved here in the last couple of months from another state due to both having declinie in health. Her husband is a retired Internist.The pt was in the hospital in February with  Afib/HF and per the daughter's history,pt may have been in afib since that time with associated fatigue and shortness of breath. Her afib has been had to manage due to hypotension with rate control drugs. She is on eliquis with a chadsvasc score of at least 5.  For f/u 6/25,received some notes from cardiologist in [place],  who stated that pt was doing ok in rate controlled afib and he thought she would be continued to be managed this way. This represented a change for the pt  when she was hospitalized in February of this year in the [place] area with heart failure and aifb. Since she has recovered from this hospitalization the daughter reports that she is more tired and winded with activities. On last visit amiodarone and tikosyn were discussed to decide if pt wanted to try either to see if her quality of life could be improved. Labs and PFT's were obtained as the family was leaning more toward amiodarone. PFT's showed moderate obstruction and she is here today for more baseline consdieration re amiodarone with walking test which pt did not desaturate and for CXR. Now family is leaning more toward Tikosyn and will let me know in the next few days. She will be establishing with Dr. Gwenlyn Found in the next week as well.  F/u in afib clinic, daughter asked for pt to be seen c/o dizziness over the last few days. Notices primarily on standing.  Orthostatic BP dropped to 86/52 with sitting at 102/62. She saw Dr.Allred a few weeks ago and she started on amiodarone 200 mg bid x  one week which did reduce to 200 mg once  a day last Saturday.   Today, she denies symptoms of palpitations, chest pain, shortness of breath, orthopnea, PND, lower extremity edema, dizziness, presyncope, syncope, or neurologic sequela. Positive for dizziness on standing. The patient is tolerating medications without difficulties and is otherwise without complaint today.   Past Medical History:  Diagnosis Date  . CHF (congestive heart failure) ([place])   . Hypertension   . Persistent atrial fibrillation ([place])   . Tinnitus    Past Surgical History:  Procedure Laterality Date  . APPENDECTOMY     60 years ago  . ATRIAL FIBRILLATION ABLATION N/A    8 years ago  . hip replacement Bilateral    4 and 8 years ago    Current Outpatient Prescriptions  Medication Sig Dispense Refill  . amiodarone (PACERONE) 200 MG tablet Take 200 mg by mouth daily.    Marland Kitchen apixaban (ELIQUIS) 5 MG TABS tablet Take 5 mg by mouth 2 (two) times daily.    Marland Kitchen diltiazem (CARDIZEM CD) 300 MG 24 hr capsule Take 300 mg by mouth daily.    Marland Kitchen escitalopram (LEXAPRO) 20 MG tablet 1 tablet daily.  11  . furosemide (LASIX) 40 MG tablet Take 20 mg by mouth daily.    . metoprolol tartrate (LOPRESSOR) 25 MG tablet Take 25 mg by mouth 2 (two) times daily.    . Multiple Vitamins-Minerals (PRESERVISION/LUTEIN) CAPS Take 1 capsule  by mouth daily.     No current facility-administered medications for this encounter.     Allergies  Allergen Reactions  . Asa [Aspirin]     Excessive bleeding    Social History   Social History  . Marital status: Married    Spouse name: N/A  . Number of children: N/A  . Years of education: N/A   Occupational History  . Not on file.   Social History Main Topics  . Smoking status: Former Smoker    Types: Cigarettes  . Smokeless tobacco: Never Used     Comment: Pt quit 50+ years ago  . Alcohol use 0.6 oz/week    1 Glasses of wine per week     Comment: occasionally  . Drug use: No  . Sexual  activity: Not on file   Other Topics Concern  . Not on file   Social History Narrative  . No narrative on file    Family History  Problem Relation Age of Onset  . Family history unknown: Yes    ROS- All systems are reviewed and negative except as per the HPI above  Physical Exam: Vitals:   03/06/17 1127  BP: 102/62  Pulse: 84  Weight: 149 lb (67.6 kg)  Height: 5\' 3"  (1.6 m)   Wt Readings from Last 3 Encounters:  03/06/17 149 lb (67.6 kg)  02/19/17 150 lb 6.4 oz (68.2 kg)  01/26/17 152 lb 6.4 oz (69.1 kg)    Labs: Lab Results  Component Value Date   NA 139 03/06/2017   K 3.8 03/06/2017   CL 102 03/06/2017   CO2 29 03/06/2017   GLUCOSE 97 03/06/2017   BUN 25 (H) 03/06/2017   CREATININE 1.02 (H) 03/06/2017   CALCIUM 9.0 03/06/2017   No results found for: INR No results found for: CHOL, HDL, LDLCALC, TRIG   GEN- The patient is well appearing, alert and oriented x 3 today.   Head- normocephalic, atraumatic Eyes-  Sclera clear, conjunctiva pink Ears- hearing intact Oropharynx- clear Neck- supple, no JVP Lymph- no cervical lymphadenopathy Lungs- Clear to ausculation bilaterally, normal work of breathing Heart- irregular rate and rhythm, no murmurs, rubs or gallops, PMI not laterally displaced GI- soft, NT, ND, + BS Extremities- no clubbing, cyanosis, or edema MS- no significant deformity or atrophy Skin- no rash or lesion Psych- euthymic mood, full affect Neuro- strength and sensation are intact  EKG-aflutter with variable AV block, v rate 84 bpm, qrs int 82 ms, qtc 496 ms Dr. Rayann Heman note reviewed    Assessment and Plan: 1. Afib/flutter Recently started on amiodarone 200 mg bid x one week now on 200 mg daily Plans for cardioversion after loading 3 weeks Continue eliquis without any missed doses  2. Dizziness Possibly related to amiodarone so reduction of dose should help Standing orthostic hypotension present and weight has been very stable, down  one lb by my weight  in clinc, daughter reports little po intake Will try to reduce lasix to 20 mg a day in case she is mildly volume depleted Daughter will watch amount of pedal edema and weight and if she jumps 3-5 lbs in a couple of days go back to previous dose  F/u here 8/20 and will plan on cardioversion at that time  Butch Penny C. Nima Bamburg, [facility] [address] [place],  [address] [phone]

## 2017-03-12 ENCOUNTER — Inpatient Hospital Stay (HOSPITAL_COMMUNITY): Admission: RE | Admit: 2017-03-12 | Payer: Medicare Other | Source: Ambulatory Visit | Admitting: Nurse Practitioner

## 2017-03-19 ENCOUNTER — Encounter (HOSPITAL_COMMUNITY): Payer: Self-pay | Admitting: Nurse Practitioner

## 2017-03-19 ENCOUNTER — Ambulatory Visit (HOSPITAL_COMMUNITY)
Admission: RE | Admit: 2017-03-19 | Discharge: 2017-03-19 | Disposition: A | Discharge status: Home / Self Care | Payer: Medicare Other | Source: Ambulatory Visit | Attending: Nurse Practitioner | Admitting: Nurse Practitioner

## 2017-03-19 VITALS — BP 116/64 | HR 101 | Ht 63.0 in | Wt 152.6 lb

## 2017-03-19 DIAGNOSIS — Z886 Allergy status to analgesic agent status: Secondary | ICD-10-CM | POA: Diagnosis not present

## 2017-03-19 DIAGNOSIS — I480 Paroxysmal atrial fibrillation: Secondary | ICD-10-CM | POA: Diagnosis not present

## 2017-03-19 DIAGNOSIS — I110 Hypertensive heart disease with heart failure: Secondary | ICD-10-CM | POA: Diagnosis not present

## 2017-03-19 DIAGNOSIS — I509 Heart failure, unspecified: Secondary | ICD-10-CM | POA: Insufficient documentation

## 2017-03-19 DIAGNOSIS — Z7901 Long term (current) use of anticoagulants: Secondary | ICD-10-CM | POA: Insufficient documentation

## 2017-03-19 DIAGNOSIS — R42 Dizziness and giddiness: Secondary | ICD-10-CM | POA: Insufficient documentation

## 2017-03-19 DIAGNOSIS — Z87891 Personal history of nicotine dependence: Secondary | ICD-10-CM | POA: Diagnosis not present

## 2017-03-19 DIAGNOSIS — I4819 Other persistent atrial fibrillation: Secondary | ICD-10-CM

## 2017-03-19 DIAGNOSIS — I481 Persistent atrial fibrillation: Secondary | ICD-10-CM | POA: Diagnosis not present

## 2017-03-19 DIAGNOSIS — I4892 Unspecified atrial flutter: Secondary | ICD-10-CM | POA: Insufficient documentation

## 2017-03-19 DIAGNOSIS — Z79899 Other long term (current) drug therapy: Secondary | ICD-10-CM | POA: Diagnosis not present

## 2017-03-19 DIAGNOSIS — Z96643 Presence of artificial hip joint, bilateral: Secondary | ICD-10-CM | POA: Insufficient documentation

## 2017-03-19 NOTE — Progress Notes (Signed)
Primary Care Physician: Lajean Manes, MD Referring Physician: Dr. Wilhelmina Mcardle Azer is a 83 y.o. female with a h/o paroxysmal afib with remote afib ablation in the afib clinic for evaluation. The history comes mostly form the daughter. Her parents have recently moved here in the last couple of months from another state due to both having declinie in health. Her husband is a retired Internist.The pt was in the hospital in February with  Afib/HF and per the daughter's history,pt may have been in afib since that time with associated fatigue and shortness of breath. Her afib has been had to manage due to hypotension with rate control drugs. She is on eliquis with a chadsvasc score of at least 5.  For f/u 6/25,received some notes from cardiologist in [place],  who stated that pt was doing ok in rate controlled afib and he thought she would be continued to be managed this way. This represented a change for the pt  when she was hospitalized in February of this year in the [place] area with heart failure and aifb. Since she has recovered from this hospitalization the daughter reports that she is more tired and winded with activities. On last visit amiodarone and tikosyn were discussed to decide if pt wanted to try either to see if her quality of life could be improved. Labs and PFT's were obtained as the family was leaning more toward amiodarone. PFT's showed moderate obstruction and she is here today for more baseline consdieration re amiodarone with walking test which pt did not desaturate and for CXR. Now family is leaning more toward Tikosyn and will let me know in the next few days. She will be establishing with Dr. Gwenlyn Found in the next week as well.  F/u in afib clinic, daughter asked for pt to be seen c/o dizziness over the last few days. Notices primarily on standing.  Orthostatic BP dropped to 86/52 with sitting at 102/62. She saw Dr.Allred a few weeks ago and she started on amiodarone 200 mg bid x  one week which did reduce to 200 mg once  a day last Saturday.   Returns 8/20. On last visit, I reduced lasix to 20 mg a day for dizziness and orthostatic BP changes.. She returns today stating she feels so much better, she is not sure she needs the cardioversion. Her weight is up 3 lbs, but she may have been slightly over diuresed and she did eat chinese food last night.BP improved at 116/64. She however did miss eliquis last night so will restart the 21 days of anticoagulation. This is not an issue as she is feeling better and really is not looking forward to cardioversion.  Today, she denies symptoms of palpitations, chest pain, shortness of breath, orthopnea, PND, lower extremity edema, dizziness, presyncope, syncope, or neurologic sequela. Positive for dizziness on standing. The patient is tolerating medications without difficulties and is otherwise without complaint today.   Past Medical History:  Diagnosis Date  . CHF (congestive heart failure) ([place])   . Hypertension   . Persistent atrial fibrillation ([place])   . Tinnitus    Past Surgical History:  Procedure Laterality Date  . APPENDECTOMY     60 years ago  . ATRIAL FIBRILLATION ABLATION N/A    8 years ago  . hip replacement Bilateral    4 and 8 years ago    Current Outpatient Prescriptions  Medication Sig Dispense Refill  . amiodarone (PACERONE) 200 MG tablet Take 200 mg by mouth  daily.    . apixaban (ELIQUIS) 5 MG TABS tablet Take 5 mg by mouth 2 (two) times daily.    Marland Kitchen diltiazem (CARDIZEM CD) 300 MG 24 hr capsule Take 300 mg by mouth daily.    Marland Kitchen escitalopram (LEXAPRO) 20 MG tablet 1 tablet daily.  11  . furosemide (LASIX) 40 MG tablet Take 20 mg by mouth daily.    . metoprolol tartrate (LOPRESSOR) 25 MG tablet Take 25 mg by mouth 2 (two) times daily.    . Multiple Vitamins-Minerals (PRESERVISION/LUTEIN) CAPS Take 1 capsule by mouth daily.     No current facility-administered medications for this encounter.     Allergies    Allergen Reactions  . Asa [Aspirin]     Excessive bleeding    Social History   Social History  . Marital status: Married    Spouse name: N/A  . Number of children: N/A  . Years of education: N/A   Occupational History  . Not on file.   Social History Main Topics  . Smoking status: Former Smoker    Types: Cigarettes  . Smokeless tobacco: Never Used     Comment: Pt quit 50+ years ago  . Alcohol use 0.6 oz/week    1 Glasses of wine per week     Comment: occasionally  . Drug use: No  . Sexual activity: Not on file   Other Topics Concern  . Not on file   Social History Narrative  . No narrative on file    Family History  Problem Relation Age of Onset  . Family history unknown: Yes    ROS- All systems are reviewed and negative except as per the HPI above  Physical Exam: Vitals:   03/19/17 1408  BP: 116/64  Pulse: (!) 101  Weight: 152 lb 9.6 oz (69.2 kg)  Height: 5\' 3"  (1.6 m)   Wt Readings from Last 3 Encounters:  03/19/17 152 lb 9.6 oz (69.2 kg)  03/06/17 149 lb (67.6 kg)  02/19/17 150 lb 6.4 oz (68.2 kg)    Labs: Lab Results  Component Value Date   NA 139 03/06/2017   K 3.8 03/06/2017   CL 102 03/06/2017   CO2 29 03/06/2017   GLUCOSE 97 03/06/2017   BUN 25 (H) 03/06/2017   CREATININE 1.02 (H) 03/06/2017   CALCIUM 9.0 03/06/2017   No results found for: INR No results found for: CHOL, HDL, LDLCALC, TRIG   GEN- The patient is well appearing, alert and oriented x 3 today.   Head- normocephalic, atraumatic Eyes-  Sclera clear, conjunctiva pink Ears- hearing intact Oropharynx- clear Neck- supple, no JVP Lymph- no cervical lymphadenopathy Lungs- Clear to ausculation bilaterally, normal work of breathing Heart- irregular rate and rhythm, no murmurs, rubs or gallops, PMI not laterally displaced GI- soft, NT, ND, + BS Extremities- no clubbing, cyanosis, or edema MS- no significant deformity or atrophy Skin- no rash or lesion Psych- euthymic  mood, full affect Neuro- strength and sensation are intact  EKG-aflutter with variable AV block, v rate 84 bpm, qrs int 82 ms, qtc 496 ms Dr. Rayann Heman note reviewed    Assessment and Plan: 1. Afib/flutter Recently started on amiodarone 200 mg bid x one week now on 200 mg daily Plans for cardioversion after loading a month However, she missed eliquis last PM, so will restart the count of 21 days of required anticoagulation before cardioversion, reminded not to miss any more  doses  2. Dizziness Improved Appears to be related to mild  over diuresis Feels so much better on 20 mg lasix daily  Continue to watch weight and amount of ankle edema Watch salt in diet Daughter will watch amount of pedal edema and weight and if she jumps 3-5 lbs in a couple of days go back to previous dose  F/u here Sept 6th and will  plan for cardioversion week of 9/17, wants to avoid DCCV the week before as it is her anniversary.  Geroge Baseman Lexia Vandevender, [facility] [address] [place], [place] [address] [phone]

## 2017-03-21 ENCOUNTER — Encounter (INDEPENDENT_AMBULATORY_CARE_PROVIDER_SITE_OTHER): Payer: Self-pay | Admitting: Ophthalmology

## 2017-03-22 ENCOUNTER — Encounter (INDEPENDENT_AMBULATORY_CARE_PROVIDER_SITE_OTHER): Payer: Medicare Other | Admitting: Ophthalmology

## 2017-03-22 DIAGNOSIS — H26493 Other secondary cataract, bilateral: Secondary | ICD-10-CM | POA: Diagnosis not present

## 2017-03-22 DIAGNOSIS — I10 Essential (primary) hypertension: Secondary | ICD-10-CM | POA: Diagnosis not present

## 2017-03-22 DIAGNOSIS — H43813 Vitreous degeneration, bilateral: Secondary | ICD-10-CM

## 2017-03-22 DIAGNOSIS — H348311 Tributary (branch) retinal vein occlusion, right eye, with retinal neovascularization: Secondary | ICD-10-CM | POA: Diagnosis not present

## 2017-03-22 DIAGNOSIS — H353112 Nonexudative age-related macular degeneration, right eye, intermediate dry stage: Secondary | ICD-10-CM

## 2017-03-22 DIAGNOSIS — H35033 Hypertensive retinopathy, bilateral: Secondary | ICD-10-CM | POA: Diagnosis not present

## 2017-04-05 ENCOUNTER — Ambulatory Visit (HOSPITAL_COMMUNITY): Payer: Medicare Other | Admitting: Nurse Practitioner

## 2017-04-10 ENCOUNTER — Telehealth (HOSPITAL_COMMUNITY): Payer: Self-pay | Admitting: *Deleted

## 2017-04-10 ENCOUNTER — Other Ambulatory Visit (HOSPITAL_COMMUNITY): Payer: Self-pay | Admitting: *Deleted

## 2017-04-10 NOTE — Telephone Encounter (Signed)
Patient needs dental extractions due to abscessed tooth. Daughter unsure if cardioversion or dental procedure needs to be done first. Roderic Palau NP talked with [facility] Dentist -- recommended she have cardioversion first then after she can safely stop DOAC can have her dental procedure. Called daughter, Margaretha Sheffield, to let her know dentist recommendations stated she would talk with her mother who was having second thoughts on having cardioversion. Will await call back from daughter.

## 2017-04-10 NOTE — Telephone Encounter (Signed)
Cardioversion set up for 9/21 per daughter request. Pt to arrive at 1045 for preop labs and cardioversion is set for 1pm with Dr. Radford Pax. NPO after MN except meds with a sip of water. Reminded not to miss any doses of Eliquis. Daughter verbalized understanding.

## 2017-04-13 ENCOUNTER — Encounter (INDEPENDENT_AMBULATORY_CARE_PROVIDER_SITE_OTHER): Payer: Medicare Other | Admitting: Ophthalmology

## 2017-04-13 DIAGNOSIS — H26491 Other secondary cataract, right eye: Secondary | ICD-10-CM | POA: Diagnosis not present

## 2017-04-20 ENCOUNTER — Encounter (HOSPITAL_COMMUNITY): Payer: Self-pay | Admitting: Nurse Practitioner

## 2017-04-20 ENCOUNTER — Ambulatory Visit (HOSPITAL_COMMUNITY)
Admission: RE | Admit: 2017-04-20 | Discharge: 2017-04-20 | Disposition: A | Discharge status: Home / Self Care | Payer: Medicare Other | Source: Ambulatory Visit | Attending: Nurse Practitioner | Admitting: Nurse Practitioner

## 2017-04-20 ENCOUNTER — Other Ambulatory Visit: Payer: Self-pay

## 2017-04-20 ENCOUNTER — Encounter (HOSPITAL_COMMUNITY): Admission: RE | Payer: Self-pay | Source: Ambulatory Visit

## 2017-04-20 ENCOUNTER — Ambulatory Visit (HOSPITAL_COMMUNITY): Admission: RE | Admit: 2017-04-20 | Payer: Medicare Other | Source: Ambulatory Visit | Admitting: Cardiology

## 2017-04-20 VITALS — BP 118/64 | HR 54 | Ht 63.0 in | Wt 150.4 lb

## 2017-04-20 DIAGNOSIS — Z7901 Long term (current) use of anticoagulants: Secondary | ICD-10-CM | POA: Insufficient documentation

## 2017-04-20 DIAGNOSIS — Z7951 Long term (current) use of inhaled steroids: Secondary | ICD-10-CM | POA: Insufficient documentation

## 2017-04-20 DIAGNOSIS — Z87891 Personal history of nicotine dependence: Secondary | ICD-10-CM | POA: Insufficient documentation

## 2017-04-20 DIAGNOSIS — Z79899 Other long term (current) drug therapy: Secondary | ICD-10-CM | POA: Diagnosis not present

## 2017-04-20 DIAGNOSIS — I4819 Other persistent atrial fibrillation: Secondary | ICD-10-CM

## 2017-04-20 DIAGNOSIS — Z886 Allergy status to analgesic agent status: Secondary | ICD-10-CM | POA: Insufficient documentation

## 2017-04-20 DIAGNOSIS — I481 Persistent atrial fibrillation: Secondary | ICD-10-CM

## 2017-04-20 DIAGNOSIS — R42 Dizziness and giddiness: Secondary | ICD-10-CM | POA: Insufficient documentation

## 2017-04-20 DIAGNOSIS — I110 Hypertensive heart disease with heart failure: Secondary | ICD-10-CM | POA: Insufficient documentation

## 2017-04-20 DIAGNOSIS — Z96643 Presence of artificial hip joint, bilateral: Secondary | ICD-10-CM | POA: Diagnosis not present

## 2017-04-20 DIAGNOSIS — I509 Heart failure, unspecified: Secondary | ICD-10-CM | POA: Insufficient documentation

## 2017-04-20 SURGERY — CARDIOVERSION
Anesthesia: Monitor Anesthesia Care

## 2017-04-20 MED ORDER — AMIODARONE HCL 200 MG PO TABS: 200.0000 mg | ORAL_TABLET | Freq: Every day | ORAL | 1 refills | Status: DC

## 2017-04-20 NOTE — Patient Instructions (Signed)
Decrease Metoprolol to 12.5 mg twice a day

## 2017-04-20 NOTE — Progress Notes (Signed)
Primary Care Physician: Lajean Manes, MD Referring Physician: Dr. Wilhelmina Mcardle Croak is a 83 y.o. female with a h/o paroxysmal afib with remote afib ablation in the afib clinic for evaluation. The history comes mostly form the daughter. Her parents have recently moved here in the last couple of months from another state due to both having declinie in health. Her husband is a retired Internist.The pt was in the hospital in February with  Afib/HF and per the daughter's history,pt may have been in afib since that time with associated fatigue and shortness of breath. Her afib has been had to manage due to hypotension with rate control drugs. She is on eliquis with a chadsvasc score of at least 5.  For f/u 6/25,received some notes from cardiologist in [place],  who stated that pt was doing ok in rate controlled afib and he thought she would be continued to be managed this way. This represented a change for the pt  when she was hospitalized in February of this year in the [place] area with heart failure and aifb. Since she has recovered from this hospitalization the daughter reports that she is more tired and winded with activities. On last visit amiodarone and tikosyn were discussed to decide if pt wanted to try either to see if her quality of life could be improved. Labs and PFT's were obtained as the family was leaning more toward amiodarone. PFT's showed moderate obstruction and she is here today for more baseline consdieration re amiodarone with walking test which pt did not desaturate and for CXR. Now family is leaning more toward Tikosyn and will let me know in the next few days. She will be establishing with Dr. Gwenlyn Found in the next week as well.  F/u in afib clinic, daughter asked for pt to be seen c/o dizziness over the last few days. Notices primarily on standing.  Orthostatic BP dropped to 86/52 with sitting at 102/62. She saw Dr.Allred a few weeks ago and she started on amiodarone 200 mg bid x  one week which did reduce to 200 mg once  a day last Saturday.   Returns 8/20. On last visit, I reduced lasix to 20 mg a day for dizziness and orthostatic BP changes.. She returns today stating she feels so much better, she is not sure she needs the cardioversion. Her weight is up 3 lbs, but she may have been slightly over diuresed and she did eat chinese food last night.BP improved at 116/64. She however did miss eliquis last night so will restart the 21 days of anticoagulation. This is not an issue as she is feeling better and really is not looking forward to cardioversion.  Returns 9/21 for visit for labs for cardioversion later this am. Pt has returned to SR since last seen, and pt and daughter are ecstatic as they were both dreading a cardioversion. She has had some mild dizziness and with her HR is the low 50's will reduce BB dose. She will need some extensive dental surgery in the near future and will need to stop DOAC for a couple of days per Dentist.   Today, she denies symptoms of palpitations, chest pain, shortness of breath, orthopnea, PND, lower extremity edema, dizziness, presyncope, syncope, or neurologic sequela. Positive for dizziness on standing. The patient is tolerating medications without difficulties and is otherwise without complaint today.   Past Medical History:  Diagnosis Date  . CHF (congestive heart failure) ([place])   . Hypertension   .  Persistent atrial fibrillation ([place])   . Tinnitus    Past Surgical History:  Procedure Laterality Date  . APPENDECTOMY     60 years ago  . ATRIAL FIBRILLATION ABLATION N/A    8 years ago  . hip replacement Bilateral    4 and 8 years ago    Current Outpatient Prescriptions  Medication Sig Dispense Refill  . amiodarone (PACERONE) 200 MG tablet Take 1 tablet (200 mg total) by mouth daily. 90 tablet 1  . apixaban (ELIQUIS) 5 MG TABS tablet Take 5 mg by mouth 2 (two) times daily.    Marland Kitchen diltiazem (CARDIZEM CD) 300 MG 24 hr capsule Take  300 mg by mouth daily.    Marland Kitchen escitalopram (LEXAPRO) 20 MG tablet 1 tablet daily.  11  . furosemide (LASIX) 40 MG tablet Take 20 mg by mouth daily.    . metoprolol tartrate (LOPRESSOR) 25 MG tablet Take 12.5 mg by mouth 2 (two) times daily.    . Multiple Vitamins-Minerals (PRESERVISION/LUTEIN) CAPS Take 1 capsule by mouth daily.     No current facility-administered medications for this encounter.     Allergies  Allergen Reactions  . Asa [Aspirin]     Excessive bleeding    Social History   Social History  . Marital status: Married    Spouse name: N/A  . Number of children: N/A  . Years of education: N/A   Occupational History  . Not on file.   Social History Main Topics  . Smoking status: Former Smoker    Types: Cigarettes  . Smokeless tobacco: Never Used     Comment: Pt quit 50+ years ago  . Alcohol use 0.6 oz/week    1 Glasses of wine per week     Comment: occasionally  . Drug use: No  . Sexual activity: Not on file   Other Topics Concern  . Not on file   Social History Narrative  . No narrative on file    Family History  Problem Relation Age of Onset  . Family history unknown: Yes    ROS- All systems are reviewed and negative except as per the HPI above  Physical Exam: Vitals:   04/20/17 1118  BP: 118/64  Pulse: (!) 54  Weight: 150 lb 6.4 oz (68.2 kg)  Height: 5\' 3"  (1.6 m)   Wt Readings from Last 3 Encounters:  04/20/17 150 lb 6.4 oz (68.2 kg)  03/19/17 152 lb 9.6 oz (69.2 kg)  03/06/17 149 lb (67.6 kg)    Labs: Lab Results  Component Value Date   NA 139 03/06/2017   K 3.8 03/06/2017   CL 102 03/06/2017   CO2 29 03/06/2017   GLUCOSE 97 03/06/2017   BUN 25 (H) 03/06/2017   CREATININE 1.02 (H) 03/06/2017   CALCIUM 9.0 03/06/2017   No results found for: INR No results found for: CHOL, HDL, LDLCALC, TRIG   GEN- The patient is well appearing, alert and oriented x 3 today.   Head- normocephalic, atraumatic Eyes-  Sclera clear, conjunctiva  pink Ears- hearing intact Oropharynx- clear Neck- supple, no JVP Lymph- no cervical lymphadenopathy Lungs- Clear to ausculation bilaterally, normal work of breathing Heart-  regular rate and rhythm, no murmurs, rubs or gallops, PMI not laterally displaced GI- soft, NT, ND, + BS Extremities- no clubbing, cyanosis, or edema MS- no significant deformity or atrophy Skin- no rash or lesion Psych- euthymic mood, full affect Neuro- strength and sensation are intact  EKG-Sinus brady at 54 bpm, LAD, Pr  int 216 ms, qrs int 80 ms, qtc 432 ms  Dr. Rayann Heman note reviewed    Assessment and Plan: 1. Afib/flutter Continue amiodarone 200 mg qd  Has converted to SR and cardioversion scheduled this am cancelled  Continue eliquis for chadsvasc score of at least 5  2.  Intermittent  Dizziness Since HR on slow side with return of SR, will reduce metoprolol to 12.5 mg bid  She had an appointment with Dr. Rayann Heman 9/24, but will push this out a few weeks  Butch Penny C. Carroll, [facility] [address] [place], [place] [address] [phone]

## 2017-04-23 ENCOUNTER — Ambulatory Visit: Payer: Medicare Other | Admitting: Internal Medicine

## 2017-04-25 ENCOUNTER — Encounter: Payer: Self-pay | Admitting: Internal Medicine

## 2017-04-27 DIAGNOSIS — Z23 Encounter for immunization: Secondary | ICD-10-CM | POA: Diagnosis not present

## 2017-04-30 ENCOUNTER — Encounter (INDEPENDENT_AMBULATORY_CARE_PROVIDER_SITE_OTHER): Payer: Medicare Other | Admitting: Ophthalmology

## 2017-04-30 DIAGNOSIS — H2701 Aphakia, right eye: Secondary | ICD-10-CM

## 2017-05-14 ENCOUNTER — Ambulatory Visit: Payer: Medicare Other | Admitting: Internal Medicine

## 2017-05-15 ENCOUNTER — Encounter: Payer: Self-pay | Admitting: Internal Medicine

## 2017-06-01 DIAGNOSIS — Z961 Presence of intraocular lens: Secondary | ICD-10-CM | POA: Diagnosis not present

## 2017-06-04 ENCOUNTER — Ambulatory Visit (INDEPENDENT_AMBULATORY_CARE_PROVIDER_SITE_OTHER): Payer: Medicare Other | Admitting: Internal Medicine

## 2017-06-04 ENCOUNTER — Encounter: Payer: Self-pay | Admitting: Internal Medicine

## 2017-06-04 VITALS — BP 108/62 | HR 59 | Ht 63.0 in | Wt 151.8 lb

## 2017-06-04 DIAGNOSIS — I4819 Other persistent atrial fibrillation: Secondary | ICD-10-CM

## 2017-06-04 DIAGNOSIS — I481 Persistent atrial fibrillation: Secondary | ICD-10-CM

## 2017-06-04 NOTE — Patient Instructions (Signed)
Medication Instructions:  Your physician recommends that you continue on your current medications as directed. Please refer to the Current Medication list given to you today.  -- If you need a refill on your cardiac medications before your next appointment, please call your pharmacy. --  Labwork: Your physician recommends that you return for lab work tomorrow:  CMET/TSH/CBC   Testing/Procedures: None ordered  Follow-Up: Your physician wants you to follow-up as scheduled.   Thank you for choosing [facility]!!   Frederik Schmidt, RN [phone]  Any Other Special Instructions Will Be Listed Below (If Applicable).

## 2017-06-04 NOTE — Progress Notes (Signed)
   PCP: Lajean Manes, MD Primary Cardiologist: Dr Gwenlyn Found Primary EP: Dr Rayann Heman  Stephanie Ritter is a 83 y.o. female who presents today for routine electrophysiology followup.  Since last being seen in our clinic, the patient reports doing very well. She had afib in September but has done well since.  Today, she denies symptoms of palpitations, chest pain, shortness of breath,  lower extremity edema, dizziness, presyncope, or syncope.  The patient is otherwise without complaint today.   Past Medical History:  Diagnosis Date  . CHF (congestive heart failure) ([place])   . Hypertension   . Persistent atrial fibrillation ([place])   . Tinnitus    Past Surgical History:  Procedure Laterality Date  . APPENDECTOMY     60 years ago  . ATRIAL FIBRILLATION ABLATION N/A    8 years ago  . hip replacement Bilateral    4 and 8 years ago    ROS- all systems are reviewed and negatives except as per HPI above  Current Outpatient Medications  Medication Sig Dispense Refill  . amiodarone (PACERONE) 200 MG tablet Take 1 tablet (200 mg total) by mouth daily. 90 tablet 1  . apixaban (ELIQUIS) 5 MG TABS tablet Take 5 mg by mouth 2 (two) times daily.    . Carboxymethylcellulose Sodium (EYE DROPS OP) Place as directed into both eyes. Use for dry eyes    . diltiazem (CARDIZEM CD) 300 MG 24 hr capsule Take 300 mg by mouth daily.    Marland Kitchen escitalopram (LEXAPRO) 20 MG tablet Take 1 tablet daily by mouth.   11  . furosemide (LASIX) 40 MG tablet Take 20 mg by mouth daily.    . metoprolol tartrate (LOPRESSOR) 25 MG tablet Take 12.5 mg by mouth 2 (two) times daily.    . Multiple Vitamins-Minerals (PRESERVISION/LUTEIN) CAPS Take 1 capsule by mouth daily.     No current facility-administered medications for this visit.     Physical Exam: Vitals:   06/04/17 1655  BP: 108/62  Pulse: (!) 59  SpO2: 96%  Weight: 151 lb 12.8 oz (68.9 kg)  Height: 5\' 3"  (1.6 m)    GEN- The patient is well appearing, alert and oriented x  3 today.   Head- normocephalic, atraumatic Eyes-  Sclera clear, conjunctiva pink Ears- hearing intact Oropharynx- clear Lungs- Clear to ausculation bilaterally, normal work of breathing Heart- Regular rate and rhythm, no murmurs, rubs or gallops, PMI not laterally displaced GI- soft, NT, ND, + BS Extremities- no clubbing, cyanosis, or edema  EKG tracing ordered today is personally reviewed and shows sinus rhythm 59 bpm, PR 228 msec, LAHB, otherwise normal ekg  Assessment and Plan:  1. Persistent atrial fibrillation Continue current therapy with eliquis and amiodarone 200mg  daily Lfts, tfts, bmet, cbc  Follow-up in the AF clinic and with Dr Gwenlyn Found as scheduled I will see when needed  Thompson Grayer MD, [facility] 06/04/2017 5:03 PM

## 2017-06-05 ENCOUNTER — Other Ambulatory Visit: Payer: Medicare Other | Admitting: *Deleted

## 2017-06-05 DIAGNOSIS — I4819 Other persistent atrial fibrillation: Secondary | ICD-10-CM

## 2017-06-05 DIAGNOSIS — I481 Persistent atrial fibrillation: Secondary | ICD-10-CM | POA: Diagnosis not present

## 2017-06-06 LAB — CBC WITH DIFFERENTIAL/PLATELET
Basophils Absolute: 0 10*3/uL (ref 0.0–0.2)
Basos: 0 %
EOS (ABSOLUTE): 0.1 10*3/uL (ref 0.0–0.4)
Eos: 1 %
Hematocrit: 39.9 % (ref 34.0–46.6)
Hemoglobin: 13.1 g/dL (ref 11.1–15.9)
Immature Grans (Abs): 0 10*3/uL (ref 0.0–0.1)
Immature Granulocytes: 0 %
Lymphocytes Absolute: 1.2 10*3/uL (ref 0.7–3.1)
Lymphs: 17 %
MCH: 30.7 pg (ref 26.6–33.0)
MCHC: 32.8 g/dL (ref 31.5–35.7)
MCV: 93 fL (ref 79–97)
Monocytes Absolute: 0.5 10*3/uL (ref 0.1–0.9)
Monocytes: 7 %
Neutrophils Absolute: 5.1 10*3/uL (ref 1.4–7.0)
Neutrophils: 75 %
Platelets: 235 10*3/uL (ref 150–379)
RBC: 4.27 x10E6/uL (ref 3.77–5.28)
RDW: 13.9 % (ref 12.3–15.4)
WBC: 6.9 10*3/uL (ref 3.4–10.8)

## 2017-06-06 LAB — COMPREHENSIVE METABOLIC PANEL
ALT: 9 IU/L (ref 0–32)
AST: 12 IU/L (ref 0–40)
Albumin/Globulin Ratio: 1.5 (ref 1.2–2.2)
Albumin: 3.8 g/dL (ref 3.5–4.7)
Alkaline Phosphatase: 80 IU/L (ref 39–117)
BUN/Creatinine Ratio: 30 — ABNORMAL HIGH (ref 12–28)
BUN: 28 mg/dL — ABNORMAL HIGH (ref 8–27)
Bilirubin Total: 0.4 mg/dL (ref 0.0–1.2)
CO2: 27 mmol/L (ref 20–29)
Calcium: 8.8 mg/dL (ref 8.7–10.3)
Chloride: 98 mmol/L (ref 96–106)
Creatinine, Ser: 0.94 mg/dL (ref 0.57–1.00)
GFR calc Af Amer: 63 mL/min/{1.73_m2} (ref >59)
GFR calc non Af Amer: 54 mL/min/{1.73_m2} — ABNORMAL LOW (ref >59)
Globulin, Total: 2.6 g/dL (ref 1.5–4.5)
Glucose: 84 mg/dL (ref 65–99)
Potassium: 4.8 mmol/L (ref 3.5–5.2)
Sodium: 139 mmol/L (ref 134–144)
Total Protein: 6.4 g/dL (ref 6.0–8.5)

## 2017-06-06 LAB — TSH: TSH: 3.63 u[IU]/mL (ref 0.450–4.500)

## 2017-06-28 ENCOUNTER — Ambulatory Visit (HOSPITAL_COMMUNITY)
Admission: RE | Admit: 2017-06-28 | Discharge: 2017-06-28 | Disposition: A | Discharge status: Home / Self Care | Payer: Medicare Other | Source: Ambulatory Visit | Attending: Nurse Practitioner | Admitting: Nurse Practitioner

## 2017-06-28 ENCOUNTER — Encounter (HOSPITAL_COMMUNITY): Payer: Self-pay | Admitting: Nurse Practitioner

## 2017-06-28 VITALS — BP 96/52 | HR 43 | Ht 63.0 in | Wt 151.0 lb

## 2017-06-28 DIAGNOSIS — Z96649 Presence of unspecified artificial hip joint: Secondary | ICD-10-CM | POA: Diagnosis not present

## 2017-06-28 DIAGNOSIS — Z9889 Other specified postprocedural states: Secondary | ICD-10-CM | POA: Diagnosis not present

## 2017-06-28 DIAGNOSIS — R531 Weakness: Secondary | ICD-10-CM

## 2017-06-28 DIAGNOSIS — I110 Hypertensive heart disease with heart failure: Secondary | ICD-10-CM | POA: Insufficient documentation

## 2017-06-28 DIAGNOSIS — Z79899 Other long term (current) drug therapy: Secondary | ICD-10-CM | POA: Insufficient documentation

## 2017-06-28 DIAGNOSIS — Z886 Allergy status to analgesic agent status: Secondary | ICD-10-CM | POA: Insufficient documentation

## 2017-06-28 DIAGNOSIS — I481 Persistent atrial fibrillation: Secondary | ICD-10-CM | POA: Insufficient documentation

## 2017-06-28 DIAGNOSIS — Z87891 Personal history of nicotine dependence: Secondary | ICD-10-CM | POA: Insufficient documentation

## 2017-06-28 DIAGNOSIS — I509 Heart failure, unspecified: Secondary | ICD-10-CM | POA: Diagnosis not present

## 2017-06-28 DIAGNOSIS — Z7901 Long term (current) use of anticoagulants: Secondary | ICD-10-CM | POA: Insufficient documentation

## 2017-06-28 NOTE — Progress Notes (Signed)
Primary Care Physician: Lajean Manes, MD Referring Physician: Dr. Wilhelmina Mcardle Stephanie Ritter is a 83 y.o. female with a h/o paroxysmal afib with conversion to SR with amiodarone  Pt did convert on the drug and did not require a cardioversion. Pt is being seen in the clinic today for some issues with irregular heart beat the last couple of days. In the clinic today, pt is in sinus brady with HR of 42 bpm. BP low at 96/52. She has not felt as well the last few days. Has noted some mild shakiness of her hands as well over the last week.   Today, she denies symptoms of palpitations, chest pain, shortness of breath, orthopnea, PND, lower extremity edema, dizziness, presyncope, syncope, or neurologic sequela.  The patient is tolerating medications without difficulties and is otherwise without complaint today.   Past Medical History:  Diagnosis Date  . CHF (congestive heart failure) ([place])   . Hypertension   . Persistent atrial fibrillation ([place])   . Tinnitus    Past Surgical History:  Procedure Laterality Date  . APPENDECTOMY     60 years ago  . ATRIAL FIBRILLATION ABLATION N/A    8 years ago  . hip replacement Bilateral    4 and 8 years ago    Current Outpatient Medications  Medication Sig Dispense Refill  . amiodarone (PACERONE) 200 MG tablet Take 1 tablet (200 mg total) by mouth daily. 90 tablet 1  . apixaban (ELIQUIS) 5 MG TABS tablet Take 5 mg by mouth 2 (two) times daily.    . Carboxymethylcellulose Sodium (EYE DROPS OP) Place as directed into both eyes. Use for dry eyes    . diltiazem (CARDIZEM CD) 300 MG 24 hr capsule Take 300 mg by mouth daily.    Marland Kitchen escitalopram (LEXAPRO) 20 MG tablet Take 1 tablet daily by mouth.   11  . furosemide (LASIX) 40 MG tablet Take 20 mg by mouth daily.    . Multiple Vitamins-Minerals (PRESERVISION/LUTEIN) CAPS Take 1 capsule by mouth daily.     No current facility-administered medications for this encounter.     Allergies  Allergen Reactions   . Asa [Aspirin]     Excessive bleeding  . Nsaids     Bleeding, ulcers    Social History   Socioeconomic History  . Marital status: Married    Spouse name: Not on file  . Number of children: Not on file  . Years of education: Not on file  . Highest education level: Not on file  Social Needs  . Financial resource strain: Not on file  . Food insecurity - worry: Not on file  . Food insecurity - inability: Not on file  . Transportation needs - medical: Not on file  . Transportation needs - non-medical: Not on file  Occupational History  . Not on file  Tobacco Use  . Smoking status: Former Smoker    Types: Cigarettes  . Smokeless tobacco: Never Used  . Tobacco comment: Pt quit 50+ years ago  Substance and Sexual Activity  . Alcohol use: Yes    Alcohol/week: 0.6 oz    Types: 1 Glasses of wine per week    Comment: occasionally  . Drug use: No  . Sexual activity: Not on file  Other Topics Concern  . Not on file  Social History Narrative  . Not on file    Family History  Family history unknown: Yes    ROS- All systems are reviewed and negative except as  per the HPI above  Physical Exam: Vitals:   06/28/17 1522  BP: (!) 96/52  Pulse: (!) 43  Weight: 151 lb (68.5 kg)  Height: 5\' 3"  (1.6 m)   Wt Readings from Last 3 Encounters:  06/28/17 151 lb (68.5 kg)  06/04/17 151 lb 12.8 oz (68.9 kg)  04/20/17 150 lb 6.4 oz (68.2 kg)    Labs: Lab Results  Component Value Date   NA 139 06/05/2017   K 4.8 06/05/2017   CL 98 06/05/2017   CO2 27 06/05/2017   GLUCOSE 84 06/05/2017   BUN 28 (H) 06/05/2017   CREATININE 0.94 06/05/2017   CALCIUM 8.8 06/05/2017   No results found for: INR No results found for: CHOL, HDL, LDLCALC, TRIG   GEN- The patient is well appearing, alert and oriented x 3 today.   Head- normocephalic, atraumatic Eyes-  Sclera clear, conjunctiva pink Ears- hearing intact Oropharynx- clear Neck- supple, no JVP Lymph- no cervical  lymphadenopathy Lungs- Clear to ausculation bilaterally, normal work of breathing Heart- slow, regular rate and rhythm, no murmurs, rubs or gallops, PMI not laterally displaced GI- soft, NT, ND, + BS Extremities- no clubbing, cyanosis, or edema MS- no significant deformity or atrophy Skin- no rash or lesion Psych- euthymic mood, full affect Neuro- strength and sensation are intact  EKG-Sinus brady at 43 bpm, first degree block, Pr int 212 ms, qrs int 104, qtc 418ms Dr. Rayann Heman note reviewed    Assessment and Plan: 1. Afib/flutter Mostly has been in SR, possibly some afib yesterday but with slow v rate and BP today Continue amiodarone 200 mg qd  Stop metoprolol and continue cardizem Continue eliquis for chadsvasc score of at least 5   F/u in one week  Stephanie Ritter, [facility] [address] [place], [place] [address] [phone]

## 2017-06-28 NOTE — Patient Instructions (Signed)
Stop metoprolol

## 2017-07-04 ENCOUNTER — Encounter (HOSPITAL_COMMUNITY): Payer: Self-pay | Admitting: Nurse Practitioner

## 2017-07-04 ENCOUNTER — Ambulatory Visit (HOSPITAL_COMMUNITY)
Admission: RE | Admit: 2017-07-04 | Discharge: 2017-07-04 | Disposition: A | Discharge status: Home / Self Care | Payer: Medicare Other | Source: Ambulatory Visit | Attending: Nurse Practitioner | Admitting: Nurse Practitioner

## 2017-07-04 VITALS — BP 118/68 | HR 75 | Ht 63.0 in | Wt 152.0 lb

## 2017-07-04 DIAGNOSIS — R531 Weakness: Secondary | ICD-10-CM

## 2017-07-04 DIAGNOSIS — I440 Atrioventricular block, first degree: Secondary | ICD-10-CM | POA: Insufficient documentation

## 2017-07-04 DIAGNOSIS — Z7901 Long term (current) use of anticoagulants: Secondary | ICD-10-CM | POA: Insufficient documentation

## 2017-07-04 DIAGNOSIS — Z87891 Personal history of nicotine dependence: Secondary | ICD-10-CM | POA: Insufficient documentation

## 2017-07-04 DIAGNOSIS — I4892 Unspecified atrial flutter: Secondary | ICD-10-CM | POA: Insufficient documentation

## 2017-07-04 DIAGNOSIS — I4510 Unspecified right bundle-branch block: Secondary | ICD-10-CM | POA: Diagnosis not present

## 2017-07-04 DIAGNOSIS — I444 Left anterior fascicular block: Secondary | ICD-10-CM | POA: Insufficient documentation

## 2017-07-04 DIAGNOSIS — Z79899 Other long term (current) drug therapy: Secondary | ICD-10-CM | POA: Insufficient documentation

## 2017-07-04 DIAGNOSIS — I509 Heart failure, unspecified: Secondary | ICD-10-CM | POA: Diagnosis not present

## 2017-07-04 DIAGNOSIS — I481 Persistent atrial fibrillation: Secondary | ICD-10-CM | POA: Diagnosis not present

## 2017-07-04 DIAGNOSIS — Z96643 Presence of artificial hip joint, bilateral: Secondary | ICD-10-CM | POA: Diagnosis not present

## 2017-07-04 DIAGNOSIS — I4819 Other persistent atrial fibrillation: Secondary | ICD-10-CM

## 2017-07-04 DIAGNOSIS — Z886 Allergy status to analgesic agent status: Secondary | ICD-10-CM | POA: Insufficient documentation

## 2017-07-04 DIAGNOSIS — I110 Hypertensive heart disease with heart failure: Secondary | ICD-10-CM | POA: Insufficient documentation

## 2017-07-04 DIAGNOSIS — I4891 Unspecified atrial fibrillation: Secondary | ICD-10-CM | POA: Diagnosis present

## 2017-07-04 NOTE — Progress Notes (Signed)
Primary Care Physician: Lajean Manes, MD Referring Physician: Dr. Wilhelmina Mcardle Stephanie Ritter is a 83 y.o. female with a h/o paroxysmal afib with conversion to SR with amiodarone  Pt did convert on the drug and did not require a cardioversion. Pt was being seen in the clinic today for some issues with irregular heart beat the last couple of days. In the clinic today, pt is in sinus brady with HR of 42 bpm. BP low at 96/52. She has not felt as well the last few days. BB was stopped.  She returns 12/5, and feels so much better off BB with HR in SR in the 70's and BP improved at 118/68.  Today, she denies symptoms of palpitations, chest pain, shortness of breath, orthopnea, PND, lower extremity edema, dizziness, presyncope, syncope, or neurologic sequela.  The patient is tolerating medications without difficulties and is otherwise without complaint today.   Past Medical History:  Diagnosis Date  . CHF (congestive heart failure) ([place])   . Hypertension   . Persistent atrial fibrillation ([place])   . Tinnitus    Past Surgical History:  Procedure Laterality Date  . APPENDECTOMY     60 years ago  . ATRIAL FIBRILLATION ABLATION N/A    8 years ago  . hip replacement Bilateral    4 and 8 years ago    Current Outpatient Medications  Medication Sig Dispense Refill  . amiodarone (PACERONE) 200 MG tablet Take 1 tablet (200 mg total) by mouth daily. 90 tablet 1  . apixaban (ELIQUIS) 5 MG TABS tablet Take 5 mg by mouth 2 (two) times daily.    . Carboxymethylcellulose Sodium (EYE DROPS OP) Place as directed into both eyes. Use for dry eyes    . diltiazem (CARDIZEM CD) 300 MG 24 hr capsule Take 300 mg by mouth daily.    Marland Kitchen escitalopram (LEXAPRO) 20 MG tablet Take 1 tablet daily by mouth.   11  . furosemide (LASIX) 40 MG tablet Take 20 mg by mouth daily.    . Multiple Vitamins-Minerals (PRESERVISION/LUTEIN) CAPS Take 1 capsule by mouth daily.     No current facility-administered medications for  this encounter.     Allergies  Allergen Reactions  . Asa [Aspirin]     Excessive bleeding  . Nsaids     Bleeding, ulcers    Social History   Socioeconomic History  . Marital status: Married    Spouse name: Not on file  . Number of children: Not on file  . Years of education: Not on file  . Highest education level: Not on file  Social Needs  . Financial resource strain: Not on file  . Food insecurity - worry: Not on file  . Food insecurity - inability: Not on file  . Transportation needs - medical: Not on file  . Transportation needs - non-medical: Not on file  Occupational History  . Not on file  Tobacco Use  . Smoking status: Former Smoker    Types: Cigarettes  . Smokeless tobacco: Never Used  . Tobacco comment: Pt quit 50+ years ago  Substance and Sexual Activity  . Alcohol use: Yes    Alcohol/week: 0.6 oz    Types: 1 Glasses of wine per week    Comment: occasionally  . Drug use: No  . Sexual activity: Not on file  Other Topics Concern  . Not on file  Social History Narrative  . Not on file    Family History  Family history unknown: Yes  ROS- All systems are reviewed and negative except as per the HPI above  Physical Exam: Vitals:   07/04/17 1407  BP: 118/68  Pulse: 75  Weight: 152 lb (68.9 kg)  Height: 5\' 3"  (1.6 m)   Wt Readings from Last 3 Encounters:  07/04/17 152 lb (68.9 kg)  06/28/17 151 lb (68.5 kg)  06/04/17 151 lb 12.8 oz (68.9 kg)    Labs: Lab Results  Component Value Date   NA 139 06/05/2017   K 4.8 06/05/2017   CL 98 06/05/2017   CO2 27 06/05/2017   GLUCOSE 84 06/05/2017   BUN 28 (H) 06/05/2017   CREATININE 0.94 06/05/2017   CALCIUM 8.8 06/05/2017   No results found for: INR No results found for: CHOL, HDL, LDLCALC, TRIG   GEN- The patient is well appearing, alert and oriented x 3 today.   Head- normocephalic, atraumatic Eyes-  Sclera clear, conjunctiva pink Ears- hearing intact Oropharynx- clear Neck- supple, no  JVP Lymph- no cervical lymphadenopathy Lungs- Clear to ausculation bilaterally, normal work of breathing Heart-  regular rate and rhythm, no murmurs, rubs or gallops, PMI not laterally displaced GI- soft, NT, ND, + BS Extremities- no clubbing, cyanosis, or edema MS- no significant deformity or atrophy Skin- no rash or lesion Psych- euthymic mood, full affect Neuro- strength and sensation are intact  EKG-Sinus rhythm at 75 bpm , first degree block, IRBBB, LAFB, Pr int 212 ms, qrs int 100, qtc 473 ms   Assessment and Plan: 1. Afib/flutter Has been in SR Now improved with stopping BB, with resolution of  brady and hypotension  Continue amiodarone 200 mg qd  Now off metoprolol and continue cardizem Continue eliquis for chadsvasc score of at least 5   F/u in 3 months  Butch Penny C. As[place] Hair, [facility] [address] [place], [place] [address] [phone]

## 2017-07-05 ENCOUNTER — Ambulatory Visit (HOSPITAL_COMMUNITY): Payer: Medicare Other | Admitting: Nurse Practitioner

## 2017-07-20 ENCOUNTER — Ambulatory Visit: Payer: Medicare Other | Admitting: Cardiovascular Disease

## 2017-08-29 ENCOUNTER — Other Ambulatory Visit (HOSPITAL_COMMUNITY): Payer: Self-pay | Admitting: Nurse Practitioner

## 2017-08-31 ENCOUNTER — Encounter (INDEPENDENT_AMBULATORY_CARE_PROVIDER_SITE_OTHER): Payer: Medicare Other | Admitting: Ophthalmology

## 2017-08-31 DIAGNOSIS — H348322 Tributary (branch) retinal vein occlusion, left eye, stable: Secondary | ICD-10-CM

## 2017-08-31 DIAGNOSIS — H35033 Hypertensive retinopathy, bilateral: Secondary | ICD-10-CM | POA: Diagnosis not present

## 2017-08-31 DIAGNOSIS — H43813 Vitreous degeneration, bilateral: Secondary | ICD-10-CM

## 2017-08-31 DIAGNOSIS — H353112 Nonexudative age-related macular degeneration, right eye, intermediate dry stage: Secondary | ICD-10-CM | POA: Diagnosis not present

## 2017-08-31 DIAGNOSIS — I10 Essential (primary) hypertension: Secondary | ICD-10-CM | POA: Diagnosis not present

## 2017-09-04 DIAGNOSIS — I480 Paroxysmal atrial fibrillation: Secondary | ICD-10-CM | POA: Diagnosis not present

## 2017-09-04 DIAGNOSIS — K429 Umbilical hernia without obstruction or gangrene: Secondary | ICD-10-CM | POA: Diagnosis not present

## 2017-09-04 DIAGNOSIS — M6208 Separation of muscle (nontraumatic), other site: Secondary | ICD-10-CM | POA: Diagnosis not present

## 2017-09-04 DIAGNOSIS — I10 Essential (primary) hypertension: Secondary | ICD-10-CM | POA: Diagnosis not present

## 2017-09-21 DIAGNOSIS — I480 Paroxysmal atrial fibrillation: Secondary | ICD-10-CM | POA: Diagnosis not present

## 2017-09-21 DIAGNOSIS — I10 Essential (primary) hypertension: Secondary | ICD-10-CM | POA: Diagnosis not present

## 2017-09-21 DIAGNOSIS — R269 Unspecified abnormalities of gait and mobility: Secondary | ICD-10-CM | POA: Diagnosis not present

## 2017-10-01 ENCOUNTER — Other Ambulatory Visit: Payer: Self-pay | Admitting: Geriatric Medicine

## 2017-10-01 ENCOUNTER — Ambulatory Visit
Admission: RE | Admit: 2017-10-01 | Discharge: 2017-10-01 | Disposition: A | Discharge status: Home / Self Care | Payer: Medicare Other | Source: Ambulatory Visit | Attending: Geriatric Medicine | Admitting: Geriatric Medicine

## 2017-10-01 DIAGNOSIS — S62317A Displaced fracture of base of fifth metacarpal bone. left hand, initial encounter for closed fracture: Secondary | ICD-10-CM | POA: Diagnosis not present

## 2017-10-01 DIAGNOSIS — M79642 Pain in left hand: Secondary | ICD-10-CM | POA: Diagnosis not present

## 2017-10-25 ENCOUNTER — Encounter (HOSPITAL_COMMUNITY): Payer: Self-pay | Admitting: Nurse Practitioner

## 2017-10-25 ENCOUNTER — Ambulatory Visit (HOSPITAL_COMMUNITY)
Admission: RE | Admit: 2017-10-25 | Discharge: 2017-10-25 | Disposition: A | Discharge status: Home / Self Care | Payer: Medicare Other | Source: Ambulatory Visit | Attending: Nurse Practitioner | Admitting: Nurse Practitioner

## 2017-10-25 VITALS — BP 132/68 | HR 58 | Ht 63.0 in | Wt 155.6 lb

## 2017-10-25 DIAGNOSIS — I509 Heart failure, unspecified: Secondary | ICD-10-CM | POA: Insufficient documentation

## 2017-10-25 DIAGNOSIS — Z886 Allergy status to analgesic agent status: Secondary | ICD-10-CM | POA: Diagnosis not present

## 2017-10-25 DIAGNOSIS — Z96643 Presence of artificial hip joint, bilateral: Secondary | ICD-10-CM | POA: Diagnosis not present

## 2017-10-25 DIAGNOSIS — I4819 Other persistent atrial fibrillation: Secondary | ICD-10-CM

## 2017-10-25 DIAGNOSIS — I480 Paroxysmal atrial fibrillation: Secondary | ICD-10-CM | POA: Diagnosis not present

## 2017-10-25 DIAGNOSIS — I4892 Unspecified atrial flutter: Secondary | ICD-10-CM | POA: Insufficient documentation

## 2017-10-25 DIAGNOSIS — Z7901 Long term (current) use of anticoagulants: Secondary | ICD-10-CM | POA: Diagnosis not present

## 2017-10-25 DIAGNOSIS — Z79899 Other long term (current) drug therapy: Secondary | ICD-10-CM | POA: Insufficient documentation

## 2017-10-25 DIAGNOSIS — I481 Persistent atrial fibrillation: Secondary | ICD-10-CM

## 2017-10-25 DIAGNOSIS — Z87891 Personal history of nicotine dependence: Secondary | ICD-10-CM | POA: Diagnosis not present

## 2017-10-25 DIAGNOSIS — I110 Hypertensive heart disease with heart failure: Secondary | ICD-10-CM | POA: Insufficient documentation

## 2017-10-25 MED ORDER — DILTIAZEM HCL ER COATED BEADS 300 MG PO CP24: 300.0000 mg | ORAL_CAPSULE | Freq: Every day | ORAL | Status: DC

## 2017-10-25 NOTE — Progress Notes (Signed)
Primary Care Physician: Lajean Manes, MD Referring Physician: Dr. Wilhelmina Mcardle Stephanie Ritter is a 84 y.o. female with a h/o paroxysmal afib with conversion to SR with amiodarone, which did not require a cardioversion. She has been maintaining SR. She had a tooth pulled yesterday and did not bleed at all. I think she will be fine to go back on her Eliquis this pm. She has had a couple of falls, once the dog pulled her and another time was bending over and got off balance. Her daughter is going to see about getting her PT for her legs, which the pt reports are weak.   Today, she denies symptoms of palpitations, chest pain, shortness of breath, orthopnea, PND, lower extremity edema, dizziness, presyncope, syncope, or neurologic sequela.  The patient is tolerating medications without difficulties and is otherwise without complaint today.   Past Medical History:  Diagnosis Date  . CHF (congestive heart failure) ([place])   . Hypertension   . Persistent atrial fibrillation ([place])   . Tinnitus    Past Surgical History:  Procedure Laterality Date  . APPENDECTOMY     60 years ago  . ATRIAL FIBRILLATION ABLATION N/A    8 years ago  . hip replacement Bilateral    4 and 8 years ago    Current Outpatient Medications  Medication Sig Dispense Refill  . amiodarone (PACERONE) 200 MG tablet TAKE 1 TABLET BY MOUTH  DAILY 90 tablet 1  . apixaban (ELIQUIS) 5 MG TABS tablet Take 5 mg by mouth 2 (two) times daily.    . Carboxymethylcellulose Sodium (EYE DROPS OP) Place as directed into both eyes. Use for dry eyes    . diltiazem (CARDIZEM CD) 300 MG 24 hr capsule Take 1 capsule (300 mg total) by mouth daily.    Marland Kitchen escitalopram (LEXAPRO) 20 MG tablet Take 1 tablet daily by mouth.   11  . furosemide (LASIX) 40 MG tablet Take 20 mg by mouth daily.    . Multiple Vitamins-Minerals (PRESERVISION/LUTEIN) CAPS Take 1 capsule by mouth daily.     No current facility-administered medications for this encounter.      Allergies  Allergen Reactions  . Asa [Aspirin]     Excessive bleeding  . Nsaids     Bleeding, ulcers    Social History   Socioeconomic History  . Marital status: Married    Spouse name: Not on file  . Number of children: Not on file  . Years of education: Not on file  . Highest education level: Not on file  Occupational History  . Not on file  Social Needs  . Financial resource strain: Not on file  . Food insecurity:    Worry: Not on file    Inability: Not on file  . Transportation needs:    Medical: Not on file    Non-medical: Not on file  Tobacco Use  . Smoking status: Former Smoker    Types: Cigarettes  . Smokeless tobacco: Never Used  . Tobacco comment: Pt quit 50+ years ago  Substance and Sexual Activity  . Alcohol use: Yes    Alcohol/week: 0.6 oz    Types: 1 Glasses of wine per week    Comment: occasionally  . Drug use: No  . Sexual activity: Not on file  Lifestyle  . Physical activity:    Days per week: Not on file    Minutes per session: Not on file  . Stress: Not on file  Relationships  . Social connections:  Talks on phone: Not on file    Gets together: Not on file    Attends religious service: Not on file    Active member of club or organization: Not on file    Attends meetings of clubs or organizations: Not on file    Relationship status: Not on file  . Intimate partner violence:    Fear of current or ex partner: Not on file    Emotionally abused: Not on file    Physically abused: Not on file    Forced sexual activity: Not on file  Other Topics Concern  . Not on file  Social History Narrative  . Not on file    Family History  Family history unknown: Yes    ROS- All systems are reviewed and negative except as per the HPI above  Physical Exam: Vitals:   10/25/17 1335  BP: 132/68  Pulse: (!) 58  Weight: 155 lb 9.6 oz (70.6 kg)  Height: 5\' 3"  (1.6 m)   Wt Readings from Last 3 Encounters:  10/25/17 155 lb 9.6 oz (70.6 kg)   07/04/17 152 lb (68.9 kg)  06/28/17 151 lb (68.5 kg)    Labs: Lab Results  Component Value Date   NA 139 06/05/2017   K 4.8 06/05/2017   CL 98 06/05/2017   CO2 27 06/05/2017   GLUCOSE 84 06/05/2017   BUN 28 (H) 06/05/2017   CREATININE 0.94 06/05/2017   CALCIUM 8.8 06/05/2017   No results found for: INR No results found for: CHOL, HDL, LDLCALC, TRIG   GEN- The patient is well appearing, alert and oriented x 3 today.   Head- normocephalic, atraumatic Eyes-  Sclera clear, conjunctiva pink Ears- hearing intact Oropharynx- clear Neck- supple, no JVP Lymph- no cervical lymphadenopathy Lungs- Clear to ausculation bilaterally, normal work of breathing Heart-  regular rate and rhythm, no murmurs, rubs or gallops, PMI not laterally displaced GI- soft, NT, ND, + BS Extremities- no clubbing, cyanosis, or edema MS- no significant deformity or atrophy Skin- no rash or lesion Psych- euthymic mood, full affect Neuro- strength and sensation are intact  EKG-Sinus rhythm at  58 bpm , first degree block, IRBBB, LAFB, Pr int 210 ms, qrs int 110, qtc 453 ms   Assessment and Plan: 1. Afib/flutter Has been in SR since staring amiodarone Now improved with stopping BB, a few months back, with resolution of  brady and hypotension  Continue amiodarone 200 mg qd  Continue Cardizem, we discussed dropping the dose to 240 mg a day for the pt was found to have some orthostatic BP at the PCP's office, but the pt does not want to at this time, for fear of encouraging afib to return, denies significant dizziness upon standing Continue eliquis for chadsvasc score of at least 5 Tsh, liver last checked 3 months ago and will recheck on return here  F/u in 3 months  Butch Penny C. Alea Ryer, [facility] [address] [place], [place] [address] [phone]

## 2017-10-29 DIAGNOSIS — R269 Unspecified abnormalities of gait and mobility: Secondary | ICD-10-CM | POA: Diagnosis not present

## 2017-10-29 DIAGNOSIS — G3184 Mild cognitive impairment, so stated: Secondary | ICD-10-CM | POA: Diagnosis not present

## 2017-11-02 ENCOUNTER — Ambulatory Visit: Payer: Medicare Other

## 2017-11-05 ENCOUNTER — Ambulatory Visit: Payer: Medicare Other | Attending: Geriatric Medicine

## 2017-11-09 DIAGNOSIS — M6281 Muscle weakness (generalized): Secondary | ICD-10-CM | POA: Diagnosis not present

## 2017-11-09 DIAGNOSIS — R2689 Other abnormalities of gait and mobility: Secondary | ICD-10-CM | POA: Diagnosis not present

## 2017-11-13 DIAGNOSIS — R2689 Other abnormalities of gait and mobility: Secondary | ICD-10-CM | POA: Diagnosis not present

## 2017-11-13 DIAGNOSIS — M6281 Muscle weakness (generalized): Secondary | ICD-10-CM | POA: Diagnosis not present

## 2017-11-16 DIAGNOSIS — M6281 Muscle weakness (generalized): Secondary | ICD-10-CM | POA: Diagnosis not present

## 2017-11-16 DIAGNOSIS — R2689 Other abnormalities of gait and mobility: Secondary | ICD-10-CM | POA: Diagnosis not present

## 2017-11-19 DIAGNOSIS — R2689 Other abnormalities of gait and mobility: Secondary | ICD-10-CM | POA: Diagnosis not present

## 2017-11-19 DIAGNOSIS — M6281 Muscle weakness (generalized): Secondary | ICD-10-CM | POA: Diagnosis not present

## 2017-11-22 DIAGNOSIS — R2689 Other abnormalities of gait and mobility: Secondary | ICD-10-CM | POA: Diagnosis not present

## 2017-11-22 DIAGNOSIS — M6281 Muscle weakness (generalized): Secondary | ICD-10-CM | POA: Diagnosis not present

## 2017-12-04 DIAGNOSIS — R2689 Other abnormalities of gait and mobility: Secondary | ICD-10-CM | POA: Diagnosis not present

## 2017-12-04 DIAGNOSIS — M6281 Muscle weakness (generalized): Secondary | ICD-10-CM | POA: Diagnosis not present

## 2018-01-01 DIAGNOSIS — H6121 Impacted cerumen, right ear: Secondary | ICD-10-CM | POA: Diagnosis not present

## 2018-01-22 ENCOUNTER — Ambulatory Visit (HOSPITAL_COMMUNITY): Payer: Medicare Other | Admitting: Nurse Practitioner

## 2018-02-11 ENCOUNTER — Other Ambulatory Visit: Payer: Self-pay | Admitting: Geriatric Medicine

## 2018-02-11 ENCOUNTER — Ambulatory Visit
Admission: RE | Admit: 2018-02-11 | Discharge: 2018-02-11 | Disposition: A | Discharge status: Home / Self Care | Payer: Medicare Other | Source: Ambulatory Visit | Attending: Geriatric Medicine | Admitting: Geriatric Medicine

## 2018-02-11 DIAGNOSIS — M549 Dorsalgia, unspecified: Secondary | ICD-10-CM

## 2018-02-11 DIAGNOSIS — S3992XA Unspecified injury of lower back, initial encounter: Secondary | ICD-10-CM | POA: Diagnosis not present

## 2018-02-11 DIAGNOSIS — M545 Low back pain: Secondary | ICD-10-CM | POA: Diagnosis not present

## 2018-02-19 ENCOUNTER — Encounter (HOSPITAL_COMMUNITY): Payer: Self-pay | Admitting: Nurse Practitioner

## 2018-02-19 ENCOUNTER — Ambulatory Visit (HOSPITAL_COMMUNITY)
Admission: RE | Admit: 2018-02-19 | Discharge: 2018-02-19 | Disposition: A | Discharge status: Home / Self Care | Payer: Medicare Other | Source: Ambulatory Visit | Attending: Nurse Practitioner | Admitting: Nurse Practitioner

## 2018-02-19 VITALS — BP 106/54 | HR 73 | Ht 63.0 in | Wt 153.0 lb

## 2018-02-19 DIAGNOSIS — Z886 Allergy status to analgesic agent status: Secondary | ICD-10-CM | POA: Diagnosis not present

## 2018-02-19 DIAGNOSIS — I110 Hypertensive heart disease with heart failure: Secondary | ICD-10-CM | POA: Insufficient documentation

## 2018-02-19 DIAGNOSIS — I509 Heart failure, unspecified: Secondary | ICD-10-CM | POA: Insufficient documentation

## 2018-02-19 DIAGNOSIS — Z7901 Long term (current) use of anticoagulants: Secondary | ICD-10-CM | POA: Diagnosis not present

## 2018-02-19 DIAGNOSIS — Z87891 Personal history of nicotine dependence: Secondary | ICD-10-CM | POA: Insufficient documentation

## 2018-02-19 DIAGNOSIS — Z79899 Other long term (current) drug therapy: Secondary | ICD-10-CM | POA: Insufficient documentation

## 2018-02-19 DIAGNOSIS — I4819 Other persistent atrial fibrillation: Secondary | ICD-10-CM

## 2018-02-19 DIAGNOSIS — I481 Persistent atrial fibrillation: Secondary | ICD-10-CM | POA: Diagnosis not present

## 2018-02-19 LAB — COMPREHENSIVE METABOLIC PANEL
ALT: 14 U/L (ref 0–44)
AST: 22 U/L (ref 15–41)
Albumin: 3.6 g/dL (ref 3.5–5.0)
Alkaline Phosphatase: 89 U/L (ref 38–126)
Anion gap: 6 (ref 5–15)
BUN: 25 mg/dL — ABNORMAL HIGH (ref 8–23)
CO2: 29 mmol/L (ref 22–32)
Calcium: 8.8 mg/dL — ABNORMAL LOW (ref 8.9–10.3)
Chloride: 106 mmol/L (ref 98–111)
Creatinine, Ser: 1.02 mg/dL — ABNORMAL HIGH (ref 0.44–1.00)
GFR calc Af Amer: 55 mL/min — ABNORMAL LOW (ref >60)
GFR calc non Af Amer: 47 mL/min — ABNORMAL LOW (ref >60)
Glucose, Bld: 132 mg/dL — ABNORMAL HIGH (ref 70–99)
Potassium: 4.2 mmol/L (ref 3.5–5.1)
Sodium: 141 mmol/L (ref 135–145)
Total Bilirubin: 0.7 mg/dL (ref 0.3–1.2)
Total Protein: 6.5 g/dL (ref 6.5–8.1)

## 2018-02-19 LAB — TSH: TSH: 2.743 u[IU]/mL (ref 0.350–4.500)

## 2018-02-19 NOTE — Progress Notes (Signed)
Primary Care Physician: Lajean Manes, MD Referring Physician: Dr. Wilhelmina Mcardle Medeiros is a 84 y.o. female with a h/o paroxysmal afib with conversion to SR with amiodarone, which did not require a cardioversion. She has been maintaining SR. She lost her husband a few weeks ago but does not feel that she has any afib in this period of grieving. BB was stopped a few months back for hypotension and bradycardia, fatigue.  Today, she denies symptoms of palpitations, chest pain, shortness of breath, orthopnea, PND, lower extremity edema, dizziness, presyncope, syncope, or neurologic sequela.  The patient is tolerating medications without difficulties and is otherwise without complaint today.   Past Medical History:  Diagnosis Date  . CHF (congestive heart failure) ([place])   . Hypertension   . Persistent atrial fibrillation ([place])   . Tinnitus    Past Surgical History:  Procedure Laterality Date  . APPENDECTOMY     60 years ago  . ATRIAL FIBRILLATION ABLATION N/A    8 years ago  . hip replacement Bilateral    4 and 8 years ago    Current Outpatient Medications  Medication Sig Dispense Refill  . amiodarone (PACERONE) 200 MG tablet TAKE 1 TABLET BY MOUTH  DAILY 90 tablet 1  . apixaban (ELIQUIS) 5 MG TABS tablet Take 5 mg by mouth 2 (two) times daily.    . Carboxymethylcellulose Sodium (EYE DROPS OP) Place as directed into both eyes. Use for dry eyes    . diltiazem (CARDIZEM CD) 300 MG 24 hr capsule Take 1 capsule (300 mg total) by mouth daily.    Marland Kitchen escitalopram (LEXAPRO) 20 MG tablet Take 1 tablet daily by mouth.   11  . furosemide (LASIX) 40 MG tablet Take 20 mg by mouth daily.    . Multiple Vitamins-Minerals (PRESERVISION/LUTEIN) CAPS Take 1 capsule by mouth daily.     No current facility-administered medications for this encounter.     Allergies  Allergen Reactions  . Asa [Aspirin]     Excessive bleeding  . Nsaids     Bleeding, ulcers    Social History    Socioeconomic History  . Marital status: Married    Spouse name: Not on file  . Number of children: Not on file  . Years of education: Not on file  . Highest education level: Not on file  Occupational History  . Not on file  Social Needs  . Financial resource strain: Not on file  . Food insecurity:    Worry: Not on file    Inability: Not on file  . Transportation needs:    Medical: Not on file    Non-medical: Not on file  Tobacco Use  . Smoking status: Former Smoker    Types: Cigarettes  . Smokeless tobacco: Never Used  . Tobacco comment: Pt quit 50+ years ago  Substance and Sexual Activity  . Alcohol use: Yes    Alcohol/week: 0.6 oz    Types: 1 Glasses of wine per week    Comment: occasionally  . Drug use: No  . Sexual activity: Not on file  Lifestyle  . Physical activity:    Days per week: Not on file    Minutes per session: Not on file  . Stress: Not on file  Relationships  . Social connections:    Talks on phone: Not on file    Gets together: Not on file    Attends religious service: Not on file    Active member of club or  organization: Not on file    Attends meetings of clubs or organizations: Not on file    Relationship status: Not on file  . Intimate partner violence:    Fear of current or ex partner: Not on file    Emotionally abused: Not on file    Physically abused: Not on file    Forced sexual activity: Not on file  Other Topics Concern  . Not on file  Social History Narrative  . Not on file    Family History  Family history unknown: Yes    ROS- All systems are reviewed and negative except as per the HPI above  Physical Exam: Vitals:   02/19/18 1509  BP: (!) 106/54  Pulse: 73  Weight: 153 lb (69.4 kg)  Height: 5\' 3"  (1.6 m)   Wt Readings from Last 3 Encounters:  02/19/18 153 lb (69.4 kg)  10/25/17 155 lb 9.6 oz (70.6 kg)  07/04/17 152 lb (68.9 kg)    Labs: Lab Results  Component Value Date   NA 139 06/05/2017   K 4.8  06/05/2017   CL 98 06/05/2017   CO2 27 06/05/2017   GLUCOSE 84 06/05/2017   BUN 28 (H) 06/05/2017   CREATININE 0.94 06/05/2017   CALCIUM 8.8 06/05/2017   No results found for: INR No results found for: CHOL, HDL, LDLCALC, TRIG   GEN- The patient is well appearing, alert and oriented x 3 today.   Head- normocephalic, atraumatic Eyes-  Sclera clear, conjunctiva pink Ears- hearing intact Oropharynx- clear Neck- supple, no JVP Lymph- no cervical lymphadenopathy Lungs- Clear to ausculation bilaterally, normal work of breathing Heart-  regular rate and rhythm, no murmurs, rubs or gallops, PMI not laterally displaced GI- soft, NT, ND, + BS Extremities- no clubbing, cyanosis, or edema MS- no significant deformity or atrophy Skin- no rash or lesion Psych- euthymic mood, full affect Neuro- strength and sensation are intact  EKG-Sinus rhythm at  73 bpm , first degree block,   Pr int 210 ms, qrs int 110, qtc 453 ms   Assessment and Plan: 1. Afib/flutter Has been in SR since staring amiodarone Off BB, continue cardizem 300 mg daily Continue amiodarone 200 mg qd  Continue eliquis for chadsvasc score of at least 5 Tsh, cmet today  F/u in 3 months  Butch Penny C. [place], [facility] [address] [place], [place] [address] [phone]

## 2018-02-21 ENCOUNTER — Ambulatory Visit (HOSPITAL_COMMUNITY): Payer: Medicare Other | Admitting: Nurse Practitioner

## 2018-02-26 ENCOUNTER — Ambulatory Visit (HOSPITAL_COMMUNITY): Payer: Medicare Other | Admitting: Nurse Practitioner

## 2018-03-11 ENCOUNTER — Encounter (INDEPENDENT_AMBULATORY_CARE_PROVIDER_SITE_OTHER): Payer: Medicare Other | Admitting: Ophthalmology

## 2018-03-11 DIAGNOSIS — H34832 Tributary (branch) retinal vein occlusion, left eye, with macular edema: Secondary | ICD-10-CM

## 2018-03-11 DIAGNOSIS — H43813 Vitreous degeneration, bilateral: Secondary | ICD-10-CM

## 2018-03-11 DIAGNOSIS — H35033 Hypertensive retinopathy, bilateral: Secondary | ICD-10-CM

## 2018-03-11 DIAGNOSIS — I10 Essential (primary) hypertension: Secondary | ICD-10-CM | POA: Diagnosis not present

## 2018-03-11 DIAGNOSIS — H353112 Nonexudative age-related macular degeneration, right eye, intermediate dry stage: Secondary | ICD-10-CM | POA: Diagnosis not present

## 2018-03-13 DIAGNOSIS — I129 Hypertensive chronic kidney disease with stage 1 through stage 4 chronic kidney disease, or unspecified chronic kidney disease: Secondary | ICD-10-CM | POA: Diagnosis not present

## 2018-03-13 DIAGNOSIS — N183 Chronic kidney disease, stage 3 (moderate): Secondary | ICD-10-CM | POA: Diagnosis not present

## 2018-03-13 DIAGNOSIS — Z1389 Encounter for screening for other disorder: Secondary | ICD-10-CM | POA: Diagnosis not present

## 2018-03-13 DIAGNOSIS — I480 Paroxysmal atrial fibrillation: Secondary | ICD-10-CM | POA: Diagnosis not present

## 2018-03-13 DIAGNOSIS — Z0000 Encounter for general adult medical examination without abnormal findings: Secondary | ICD-10-CM | POA: Diagnosis not present

## 2018-03-13 DIAGNOSIS — I10 Essential (primary) hypertension: Secondary | ICD-10-CM | POA: Diagnosis not present

## 2018-03-13 DIAGNOSIS — F330 Major depressive disorder, recurrent, mild: Secondary | ICD-10-CM | POA: Diagnosis not present

## 2018-03-13 DIAGNOSIS — Z23 Encounter for immunization: Secondary | ICD-10-CM | POA: Diagnosis not present

## 2018-03-13 DIAGNOSIS — Z79899 Other long term (current) drug therapy: Secondary | ICD-10-CM | POA: Diagnosis not present

## 2018-03-13 DIAGNOSIS — M545 Low back pain: Secondary | ICD-10-CM | POA: Diagnosis not present

## 2018-05-07 ENCOUNTER — Other Ambulatory Visit (HOSPITAL_COMMUNITY): Payer: Self-pay | Admitting: Nurse Practitioner

## 2018-05-10 DIAGNOSIS — C44391 Other specified malignant neoplasm of skin of nose: Secondary | ICD-10-CM | POA: Diagnosis not present

## 2018-05-10 DIAGNOSIS — C44311 Basal cell carcinoma of skin of nose: Secondary | ICD-10-CM | POA: Diagnosis not present

## 2018-05-10 DIAGNOSIS — C44321 Squamous cell carcinoma of skin of nose: Secondary | ICD-10-CM | POA: Diagnosis not present

## 2018-05-10 DIAGNOSIS — L7211 Pilar cyst: Secondary | ICD-10-CM | POA: Diagnosis not present

## 2018-05-22 ENCOUNTER — Ambulatory Visit (HOSPITAL_COMMUNITY)
Admission: RE | Admit: 2018-05-22 | Discharge: 2018-05-22 | Disposition: A | Discharge status: Home / Self Care | Payer: Medicare Other | Source: Ambulatory Visit | Attending: Nurse Practitioner | Admitting: Nurse Practitioner

## 2018-05-22 VITALS — BP 142/64 | HR 61 | Ht 63.0 in | Wt 153.0 lb

## 2018-05-22 DIAGNOSIS — I480 Paroxysmal atrial fibrillation: Secondary | ICD-10-CM | POA: Diagnosis not present

## 2018-05-22 DIAGNOSIS — Z87891 Personal history of nicotine dependence: Secondary | ICD-10-CM | POA: Diagnosis not present

## 2018-05-22 DIAGNOSIS — Z886 Allergy status to analgesic agent status: Secondary | ICD-10-CM | POA: Insufficient documentation

## 2018-05-22 DIAGNOSIS — I110 Hypertensive heart disease with heart failure: Secondary | ICD-10-CM | POA: Insufficient documentation

## 2018-05-22 DIAGNOSIS — I509 Heart failure, unspecified: Secondary | ICD-10-CM | POA: Insufficient documentation

## 2018-05-22 DIAGNOSIS — Z79899 Other long term (current) drug therapy: Secondary | ICD-10-CM | POA: Diagnosis not present

## 2018-05-22 DIAGNOSIS — Z96643 Presence of artificial hip joint, bilateral: Secondary | ICD-10-CM | POA: Diagnosis not present

## 2018-05-22 DIAGNOSIS — I4819 Other persistent atrial fibrillation: Secondary | ICD-10-CM

## 2018-05-22 DIAGNOSIS — I4892 Unspecified atrial flutter: Secondary | ICD-10-CM | POA: Insufficient documentation

## 2018-05-22 DIAGNOSIS — Z7901 Long term (current) use of anticoagulants: Secondary | ICD-10-CM | POA: Diagnosis not present

## 2018-05-22 NOTE — Progress Notes (Signed)
Primary Care Physician: Lajean Manes, MD Referring Physician: Dr. Felipa Eth EP: Dr. Dahlia Bailiff Stephanie is a 84 y.o. Ritter with a h/o paroxysmal afib with conversion to SR with amiodarone, which did not require a cardioversion. She feels well today, "stronger every day". No afib to appreciate.No bleeding issues.  Today, she denies symptoms of palpitations, chest pain, shortness of breath, orthopnea, PND, lower extremity edema, dizziness, presyncope, syncope, or neurologic sequela.  The patient is tolerating medications without difficulties and is otherwise without complaint today.   Past Medical History:  Diagnosis Date  . CHF (congestive heart failure) ([place])   . Hypertension   . Persistent atrial fibrillation ([place])   . Tinnitus    Past Surgical History:  Procedure Laterality Date  . APPENDECTOMY     60 years ago  . ATRIAL FIBRILLATION ABLATION N/A    8 years ago  . hip replacement Bilateral    4 and 8 years ago    Current Outpatient Medications  Medication Sig Dispense Refill  . amiodarone (PACERONE) 200 MG tablet TAKE 1 TABLET BY MOUTH  DAILY 90 tablet 1  . apixaban (ELIQUIS) 5 MG TABS tablet Take 5 mg by mouth 2 (two) times daily.    . Carboxymethylcellulose Sodium (EYE DROPS OP) Place as directed into both eyes. Use for dry eyes    . diltiazem (CARDIZEM CD) 300 MG 24 hr capsule Take 1 capsule (300 mg total) by mouth daily.    Marland Kitchen escitalopram (LEXAPRO) 20 MG tablet Take 1 tablet daily by mouth.   11  . furosemide (LASIX) 40 MG tablet Take 20 mg by mouth daily.    . Multiple Vitamins-Minerals (PRESERVISION/LUTEIN) CAPS Take 1 capsule by mouth daily.     No current facility-administered medications for this encounter.     Allergies  Allergen Reactions  . Asa [Aspirin]     Excessive bleeding  . Nsaids     Bleeding, ulcers    Social History   Socioeconomic History  . Marital status: Married    Spouse name: Not on file  . Number of children: Not on file  .  Years of education: Not on file  . Highest education level: Not on file  Occupational History  . Not on file  Social Needs  . Financial resource strain: Not on file  . Food insecurity:    Worry: Not on file    Inability: Not on file  . Transportation needs:    Medical: Not on file    Non-medical: Not on file  Tobacco Use  . Smoking status: Former Smoker    Types: Cigarettes  . Smokeless tobacco: Never Used  . Tobacco comment: Pt quit 50+ years ago  Substance and Sexual Activity  . Alcohol use: Yes    Alcohol/week: 1.0 standard drinks    Types: 1 Glasses of wine per week    Comment: occasionally  . Drug use: No  . Sexual activity: Not on file  Lifestyle  . Physical activity:    Days per week: Not on file    Minutes per session: Not on file  . Stress: Not on file  Relationships  . Social connections:    Talks on phone: Not on file    Gets together: Not on file    Attends religious service: Not on file    Active member of club or organization: Not on file    Attends meetings of clubs or organizations: Not on file    Relationship status: Not  on file  . Intimate partner violence:    Fear of current or ex partner: Not on file    Emotionally abused: Not on file    Physically abused: Not on file    Forced sexual activity: Not on file  Other Topics Concern  . Not on file  Social History Narrative  . Not on file    Family History  Family history unknown: Yes    ROS- All systems are reviewed and negative except as per the HPI above  Physical Exam: Vitals:   05/22/18 1538  BP: (!) 142/64  Pulse: 61  Weight: 69.4 kg  Height: 5\' 3"  (1.6 m)   Wt Readings from Last 3 Encounters:  05/22/18 69.4 kg  02/19/18 69.4 kg  10/25/17 70.6 kg    Labs: Lab Results  Component Value Date   NA 141 02/19/2018   K 4.2 02/19/2018   CL 106 02/19/2018   CO2 29 02/19/2018   GLUCOSE 132 (H) 02/19/2018   BUN 25 (H) 02/19/2018   CREATININE 1.02 (H) 02/19/2018   CALCIUM 8.8 (L)  02/19/2018   No results found for: INR No results found for: CHOL, HDL, LDLCALC, TRIG   GEN- The patient is well appearing, alert and oriented x 3 today.   Head- normocephalic, atraumatic Eyes-  Sclera clear, conjunctiva pink Ears- hearing intact Oropharynx- clear Neck- supple, no JVP Lymph- no cervical lymphadenopathy Lungs- Clear to ausculation bilaterally, normal work of breathing Heart-  regular rate and rhythm, no murmurs, rubs or gallops, PMI not laterally displaced GI- soft, NT, ND, + BS Extremities- no clubbing, cyanosis, or edema MS- no significant deformity or atrophy Skin- no rash or lesion Psych- euthymic mood, full affect Neuro- strength and sensation are intact  EKG-Sinus rhythm with first degree block, at 61 bpm , first degree block,   Pr int 218 ms, qrs int 108, qtc 469 ms   Assessment and Plan: 1. Afib/flutter Has been in SR since staring amiodarone Off BB, continue cardizem 300 mg daily Offered to reduce dose of amiodarone or Cardizem but pt feels so good to wants to leave drug doses as they are Continue amiodarone 200 mg qd  Continue eliquis for chadsvasc score of at least 5, still appropriately dosed Labs drawn in July and WNL Will repeat on next visit  F/u in 3 months  [name], [facility] [address] [place], [place] [address] [phone]

## 2018-06-03 DIAGNOSIS — Z23 Encounter for immunization: Secondary | ICD-10-CM | POA: Diagnosis not present

## 2018-06-19 ENCOUNTER — Ambulatory Visit: Payer: Medicare Other | Admitting: Psychiatry

## 2018-07-30 ENCOUNTER — Ambulatory Visit: Payer: Medicare Other | Admitting: Psychiatry

## 2018-08-01 DIAGNOSIS — C44311 Basal cell carcinoma of skin of nose: Secondary | ICD-10-CM | POA: Diagnosis not present

## 2018-08-06 ENCOUNTER — Other Ambulatory Visit: Payer: Self-pay | Admitting: Internal Medicine

## 2018-08-06 ENCOUNTER — Ambulatory Visit
Admission: RE | Admit: 2018-08-06 | Discharge: 2018-08-06 | Disposition: A | Discharge status: Home / Self Care | Payer: Medicare Other | Source: Ambulatory Visit | Attending: Internal Medicine | Admitting: Internal Medicine

## 2018-08-06 ENCOUNTER — Inpatient Hospital Stay (HOSPITAL_COMMUNITY)
Admission: EM | Admit: 2018-08-06 | Discharge: 2018-08-09 | DRG: 351 | Disposition: A | Discharge status: Home Health | Payer: Medicare Other | Attending: Surgery | Admitting: Surgery

## 2018-08-06 ENCOUNTER — Emergency Department (HOSPITAL_COMMUNITY): Payer: Medicare Other

## 2018-08-06 ENCOUNTER — Encounter (HOSPITAL_COMMUNITY): Payer: Self-pay | Admitting: Emergency Medicine

## 2018-08-06 ENCOUNTER — Other Ambulatory Visit: Payer: Self-pay

## 2018-08-06 DIAGNOSIS — K4130 Unilateral femoral hernia, with obstruction, without gangrene, not specified as recurrent: Principal | ICD-10-CM | POA: Diagnosis present

## 2018-08-06 DIAGNOSIS — K429 Umbilical hernia without obstruction or gangrene: Secondary | ICD-10-CM | POA: Diagnosis present

## 2018-08-06 DIAGNOSIS — R1084 Generalized abdominal pain: Secondary | ICD-10-CM | POA: Diagnosis not present

## 2018-08-06 DIAGNOSIS — K56609 Unspecified intestinal obstruction, unspecified as to partial versus complete obstruction: Secondary | ICD-10-CM | POA: Diagnosis not present

## 2018-08-06 DIAGNOSIS — K5730 Diverticulosis of large intestine without perforation or abscess without bleeding: Secondary | ICD-10-CM | POA: Diagnosis not present

## 2018-08-06 DIAGNOSIS — I110 Hypertensive heart disease with heart failure: Secondary | ICD-10-CM | POA: Diagnosis present

## 2018-08-06 DIAGNOSIS — K56699 Other intestinal obstruction unspecified as to partial versus complete obstruction: Secondary | ICD-10-CM | POA: Diagnosis not present

## 2018-08-06 DIAGNOSIS — I10 Essential (primary) hypertension: Secondary | ICD-10-CM | POA: Diagnosis not present

## 2018-08-06 DIAGNOSIS — I4891 Unspecified atrial fibrillation: Secondary | ICD-10-CM | POA: Diagnosis not present

## 2018-08-06 DIAGNOSIS — R109 Unspecified abdominal pain: Secondary | ICD-10-CM | POA: Diagnosis not present

## 2018-08-06 DIAGNOSIS — R140 Abdominal distension (gaseous): Secondary | ICD-10-CM

## 2018-08-06 DIAGNOSIS — Z96643 Presence of artificial hip joint, bilateral: Secondary | ICD-10-CM | POA: Diagnosis present

## 2018-08-06 DIAGNOSIS — I4819 Other persistent atrial fibrillation: Secondary | ICD-10-CM | POA: Diagnosis present

## 2018-08-06 DIAGNOSIS — Z87891 Personal history of nicotine dependence: Secondary | ICD-10-CM

## 2018-08-06 DIAGNOSIS — Z7901 Long term (current) use of anticoagulants: Secondary | ICD-10-CM

## 2018-08-06 DIAGNOSIS — I509 Heart failure, unspecified: Secondary | ICD-10-CM | POA: Diagnosis not present

## 2018-08-06 DIAGNOSIS — N179 Acute kidney failure, unspecified: Secondary | ICD-10-CM | POA: Diagnosis present

## 2018-08-06 DIAGNOSIS — Z79899 Other long term (current) drug therapy: Secondary | ICD-10-CM

## 2018-08-06 DIAGNOSIS — K4190 Unilateral femoral hernia, without obstruction or gangrene, not specified as recurrent: Secondary | ICD-10-CM | POA: Diagnosis present

## 2018-08-06 DIAGNOSIS — Z886 Allergy status to analgesic agent status: Secondary | ICD-10-CM

## 2018-08-06 LAB — CBC WITH DIFFERENTIAL/PLATELET
Abs Immature Granulocytes: 0.03 10*3/uL (ref 0.00–0.07)
Basophils Absolute: 0 10*3/uL (ref 0.0–0.1)
Basophils Relative: 0 %
Eosinophils Absolute: 0 10*3/uL (ref 0.0–0.5)
Eosinophils Relative: 0 %
HCT: 47.2 % — ABNORMAL HIGH (ref 36.0–46.0)
Hemoglobin: 14.7 g/dL (ref 12.0–15.0)
Immature Granulocytes: 0 %
Lymphocytes Relative: 8 %
Lymphs Abs: 0.6 10*3/uL — ABNORMAL LOW (ref 0.7–4.0)
MCH: 29.5 pg (ref 26.0–34.0)
MCHC: 31.1 g/dL (ref 30.0–36.0)
MCV: 94.8 fL (ref 80.0–100.0)
Monocytes Absolute: 0.7 10*3/uL (ref 0.1–1.0)
Monocytes Relative: 9 %
Neutro Abs: 6.6 10*3/uL (ref 1.7–7.7)
Neutrophils Relative %: 83 %
Platelets: 199 10*3/uL (ref 150–400)
RBC: 4.98 MIL/uL (ref 3.87–5.11)
RDW: 14.9 % (ref 11.5–15.5)
WBC: 7.9 10*3/uL (ref 4.0–10.5)
nRBC: 0 % (ref 0.0–0.2)

## 2018-08-06 LAB — COMPREHENSIVE METABOLIC PANEL
ALT: 13 U/L (ref 0–44)
AST: 18 U/L (ref 15–41)
Albumin: 3.5 g/dL (ref 3.5–5.0)
Alkaline Phosphatase: 66 U/L (ref 38–126)
Anion gap: 9 (ref 5–15)
BUN: 27 mg/dL — ABNORMAL HIGH (ref 8–23)
CO2: 26 mmol/L (ref 22–32)
Calcium: 9.1 mg/dL (ref 8.9–10.3)
Chloride: 102 mmol/L (ref 98–111)
Creatinine, Ser: 1.15 mg/dL — ABNORMAL HIGH (ref 0.44–1.00)
GFR calc Af Amer: 49 mL/min — ABNORMAL LOW (ref >60)
GFR calc non Af Amer: 42 mL/min — ABNORMAL LOW (ref >60)
Glucose, Bld: 113 mg/dL — ABNORMAL HIGH (ref 70–99)
Potassium: 4.3 mmol/L (ref 3.5–5.1)
Sodium: 137 mmol/L (ref 135–145)
Total Bilirubin: 0.9 mg/dL (ref 0.3–1.2)
Total Protein: 6.7 g/dL (ref 6.5–8.1)

## 2018-08-06 LAB — I-STAT CG4 LACTIC ACID, ED: Lactic Acid, Venous: 0.93 mmol/L (ref 0.5–1.9)

## 2018-08-06 LAB — LIPASE, BLOOD: Lipase: 18 U/L (ref 11–51)

## 2018-08-06 MED ORDER — LACTATED RINGERS IV BOLUS
1000.0000 mL | Freq: Once | INTRAVENOUS | Status: AC
  Administered 2018-08-06: 1000 mL via INTRAVENOUS

## 2018-08-06 MED ORDER — MORPHINE SULFATE (PF) 2 MG/ML IV SOLN
2.0000 mg | Freq: Once | INTRAVENOUS | Status: AC
  Administered 2018-08-06: 2 mg via INTRAVENOUS
  Filled 2018-08-06: qty 1

## 2018-08-06 MED ORDER — IOHEXOL 300 MG/ML  SOLN
100.0000 mL | Freq: Once | INTRAMUSCULAR | Status: AC | PRN
  Administered 2018-08-06: 80 mL via INTRAVENOUS

## 2018-08-06 MED ORDER — ONDANSETRON HCL 4 MG/2ML IJ SOLN
4.0000 mg | Freq: Once | INTRAMUSCULAR | Status: AC
  Administered 2018-08-06: 4 mg via INTRAVENOUS
  Filled 2018-08-06: qty 2

## 2018-08-06 NOTE — ED Provider Notes (Signed)
[facility] EMERGENCY DEPARTMENT Provider Note   CSN: [id number] Arrival date & time: 08/06/18  1720     History   Chief Complaint No chief complaint on file.   HPI Stephanie Ritter is a 85 y.o. female.  49yo F w/ PMH including CHF, HTN, A fib who p/w abdominal pain. Pt has a long-standing h/o several abdominal hernias including umbilical hernia. She occasionally has pain in these areas when she gets constipation but pain usually resolves on its own. Yesterday she began having abdominal pain but it became more severe than usual and she has had associated nausea.  She had one episode of vomiting last night.  The pain has been persistent throughout today and she has had associated distention.  She saw PCP and had an abdominal x-ray that was concerning for partial bowel obstruction so she was sent here to the ED for evaluation.  She denies any nausea currently and her pain is currently 2/10 in intensity.  No fevers or urinary symptoms.  She had 2 small bowel movements earlier today.  The history is provided by the patient and a relative.    Past Medical History:  Diagnosis Date  . CHF (congestive heart failure) ([place])   . Hypertension   . Persistent atrial fibrillation   . Tinnitus     Patient Active Problem List   Diagnosis Date Noted  . Persistent atrial fibrillation 01/26/2017  . Essential hypertension 01/26/2017  . Acute combined systolic and diastolic heart failure ([place]) 01/26/2017    Past Surgical History:  Procedure Laterality Date  . APPENDECTOMY     60 years ago  . ATRIAL FIBRILLATION ABLATION N/A    8 years ago  . hip replacement Bilateral    4 and 8 years ago     OB History   No obstetric history on file.      Home Medications    Prior to Admission medications   Medication Sig Start Date End Date Taking? Authorizing Provider  acetaminophen (TYLENOL) 500 MG tablet Take 500-1,000 mg by mouth every 6 (six) hours as needed for mild pain or  headache.   Yes [provider]  amiodarone (PACERONE) 200 MG tablet TAKE 1 TABLET BY MOUTH  DAILY Patient taking differently: Take 200 mg by mouth daily.  05/07/18  Yes Sherran Needs, NP  apixaban (ELIQUIS) 5 MG TABS tablet Take 5 mg by mouth 2 (two) times daily.    Yes [provider]  Carboxymethylcellulose Sodium (EYE DROPS OP) Place 2 drops into both eyes as needed (for dry eyes).    Yes [provider]  diltiazem (CARDIZEM CD) 300 MG 24 hr capsule Take 1 capsule (300 mg total) by mouth daily. 10/25/17  Yes Sherran Needs, NP  escitalopram (LEXAPRO) 20 MG tablet Take 20 mg by mouth daily.  12/27/16  Yes [provider]  furosemide (LASIX) 40 MG tablet Take 20 mg by mouth daily.   Yes [provider]  Multiple Vitamins-Minerals (PRESERVISION/LUTEIN) CAPS Take 1 capsule by mouth 2 (two) times daily.    Yes [provider]    Family History Family History  Family history unknown: Yes    Social History Social History   Tobacco Use  . Smoking status: Former Smoker    Types: Cigarettes  . Smokeless tobacco: Never Used  . Tobacco comment: Pt quit 50+ years ago  Substance Use Topics  . Alcohol use: Yes    Alcohol/week: 1.0 standard drinks    Types:  1 Glasses of wine per week    Comment: occasionally  . Drug use: No     Allergies   Asa [aspirin] and Nsaids   Review of Systems Review of Systems All other systems reviewed and are negative except that which was mentioned in HPI   Physical Exam Updated Vital Signs BP (!) 176/72   Pulse 71   Temp 97.7 F (36.5 C) (Oral)   Resp 16   Ht 5\' 4"  (1.626 m)   Wt 65.8 kg   SpO2 97%   BMI 24.89 kg/m   Physical Exam Vitals signs and nursing note reviewed.  Constitutional:      General: She is not in acute distress.    Appearance: She is well-developed.  HENT:     Head: Normocephalic and atraumatic.  Eyes:     Conjunctiva/sclera: Conjunctivae normal.     Pupils:  Pupils are equal, round, and reactive to light.  Neck:     Musculoskeletal: Neck supple.  Cardiovascular:     Rate and Rhythm: Normal rate and regular rhythm.     Heart sounds: Normal heart sounds. No murmur.  Pulmonary:     Effort: Pulmonary effort is normal.     Breath sounds: Normal breath sounds.  Abdominal:     General: Bowel sounds are normal. There is distension.     Palpations: Abdomen is soft.     Tenderness: There is abdominal tenderness. There is no rebound.     Hernia: A hernia is present.     Comments: Umbilical hernia, pain with palpation but reducible; moderate distension, generalized tenderness without peritonitis  Skin:    General: Skin is warm and dry.  Neurological:     Mental Status: She is alert and oriented to person, place, and time.     Comments: Fluent speech  Psychiatric:        Judgment: Judgment normal.      ED Treatments / Results  Labs (all labs ordered are listed, but only abnormal results are displayed) Labs Reviewed  COMPREHENSIVE METABOLIC PANEL - Abnormal; Notable for the following components:      Result Value   Glucose, Bld 113 (*)    BUN 27 (*)    Creatinine, Ser 1.15 (*)    GFR calc non Af Amer 42 (*)    GFR calc Af Amer 49 (*)    All other components within normal limits  CBC WITH DIFFERENTIAL/PLATELET - Abnormal; Notable for the following components:   HCT 47.2 (*)    Lymphs Abs 0.6 (*)    All other components within normal limits  LIPASE, BLOOD  URINALYSIS, ROUTINE W REFLEX MICROSCOPIC  I-STAT CG4 LACTIC ACID, ED    EKG None  Radiology Ct Abdomen Pelvis W Contrast  Result Date: 08/06/2018 CLINICAL DATA:  85 year old with abdominal pain and distension. Hernias. Concern for bowel obstruction. EXAM: CT ABDOMEN AND PELVIS WITH CONTRAST TECHNIQUE: Multidetector CT imaging of the abdomen and pelvis was performed using the standard protocol following bolus administration of intravenous contrast. CONTRAST:  3mL OMNIPAQUE IOHEXOL  300 MG/ML  SOLN COMPARISON:  Radiographs earlier this day. FINDINGS: Lower chest: Mild basilar scarring or atelectasis. No confluent consolidation. No pleural fluid. Hepatobiliary: Multiple low-density subcentimeter lesions scattered throughout the liver, too small to characterize. Layering hyperdensity in the gallbladder may be stones or sludge. No evidence of pericholecystic inflammation or gallbladder wall thickening. No biliary dilatation. Pancreas: Parenchymal atrophy. No ductal dilatation or inflammation. Spleen: Calcified granuloma. Normal in size. No suspicious focal  lesion. Adrenals/Urinary Tract: No adrenal nodule. Bilateral renal parenchymal thinning. No hydronephrosis or perinephric edema. Multiple low-density lesions throughout both kidneys, largest representing simple cysts, many lesions are subcentimeter and too small to characterize. No evidence of suspicious solid lesion. Symmetric excretion on delayed phase imaging. Urinary bladder is partially distended, motion artifact and streak artifact from bilateral hip arthroplasties partially obscures evaluation. Stomach/Bowel: Ingested material in the stomach. Diffusely dilated fluid-filled small bowel with transition point in the right inguinal hernia. Exiting small bowel is decompressed. Small amount of mesenteric edema and free fluid in the left abdomen. No pneumatosis or perforation. Advanced diffuse colonic diverticulosis throughout the entire colon. No evidence diverticulitis. Portions the sigmoid colon are obscured by streak artifact. Reported appendectomy. Vascular/Lymphatic: Aortic atherosclerosis and tortuosity. No aneurysm. No abdominopelvic adenopathy. Prominence of adnexal vascularity and dilatation of the left ovarian vein at 7 mm. Reproductive: Prominent periuterine and adnexal vascularity with dilatation of the left ovarian vein. Uterus is atrophic. No adnexal mass. Other: No free air or perforation. Small amount of mesenteric free fluid in  the left abdomen, with small amount of free fluid tracking in the pelvis. No intra-abdominal abscess. Right inguinal hernia contains obstructed small bowel and small amount of free fluid. Musculoskeletal: Bilateral hip arthroplasties. Multilevel degenerative change in the spine. Mild-to-moderate T12 superior endplate compression fracture is unchanged from radiographs 02/11/2018. IMPRESSION: 1. Small bowel obstruction with transition point in a right inguinal hernia. 2. Advanced diffuse colonic diverticulosis without diverticulitis. 3. Layering hyperdensity in the gallbladder consistent with stones/sludge. 4. Prominent adnexal vascularity and dilatation of the left ovarian vein, can be seen with pelvic congestion syndrome. 5.  Aortic Atherosclerosis (ICD10-I70.0). Electronically Signed   By: Keith Rake M.D.   On: 08/06/2018 20:42   Dg Acute Abd 2+v Abd (supine,erect,decub) + 1v Chest  Result Date: 08/06/2018 CLINICAL DATA:  Abdominal distension and pain for several days EXAM: DG ABDOMEN ACUTE W/ 1V CHEST COMPARISON:  01/22/2017. FINDINGS: Cardiac shadows within normal limits. The lungs are well aerated bilaterally. No focal infiltrate is seen. Stable bibasilar scarring is noted. Multiple dilated loops of small bowel are noted with differential air-fluid levels consistent with small bowel obstruction. Fecal material is noted throughout the colon. Bilateral hip replacements are seen. No free air is noted. Degenerative changes of lumbar spine are seen. IMPRESSION: Changes consistent with at least partial small bowel obstruction without evidence of perforation. CT is recommended for further evaluation. These results will be called to the ordering clinician or representative by the Radiologist Assistant, and communication documented in the PACS or zVision Dashboard. Electronically Signed   By: Inez Catalina M.D.   On: 08/06/2018 16:49    Procedures Procedures (including critical care time)  Medications  Ordered in ED Medications  lactated ringers bolus 1,000 mL (0 mLs Intravenous Stopped 08/06/18 2145)  iohexol (OMNIPAQUE) 300 MG/ML solution 100 mL (80 mLs Intravenous Contrast Given 08/06/18 2008)  morphine 2 MG/ML injection 2 mg (2 mg Intravenous Given 08/06/18 2143)  ondansetron (ZOFRAN) injection 4 mg (4 mg Intravenous Given 08/06/18 2143)     Initial Impression / Assessment and Plan / ED Course  I have reviewed the triage vital signs and the nursing notes.  Pertinent labs & imaging results that were available during my care of the patient were reviewed by me and considered in my medical decision making (see chart for details).    Non-toxic on exam, hypertensive but otherwise reassuring vital signs.  Obtain CT which showed SBO with transition point at right  inguinal hernia.  Contacted general surgery, patient evaluated by Dr. Rosendo Gros.  I appreciate his assistance.  He will admit for further care.  Final Clinical Impressions(s) / ED Diagnoses   Final diagnoses:  Small bowel obstruction [facility])    ED Discharge Orders    None       Nyree Yonker, Wenda Overland, MD 08/06/18 2302

## 2018-08-06 NOTE — H&P (Signed)
Stephanie Ritter is an 85 y.o. female.   Chief Complaint: abdominal pain HPI: Pt is an 52F with a h/o afib on eliquis, HTN, CHF, who comes in with abdominal pain since 0500.  Pt states she's had con't abdominal pain since that time. She had one bout of nausea and emesis this AM at 0500 and has had some bloating since then.  Pt underwent CT in ED which I reviewed personally which a RIH, likely femoral containing SB and transition point.    Pt's daughter states she last took her Eliquis Monday AM.  Past Medical History:  Diagnosis Date  . CHF (congestive heart failure) ([place])   . Hypertension   . Persistent atrial fibrillation   . Tinnitus     Past Surgical History:  Procedure Laterality Date  . APPENDECTOMY     60 years ago  . ATRIAL FIBRILLATION ABLATION N/A    8 years ago  . hip replacement Bilateral    4 and 8 years ago    Family History  Family history unknown: Yes   Social History:  reports that she has quit smoking. Her smoking use included cigarettes. She has never used smokeless tobacco. She reports current alcohol use of about 1.0 standard drinks of alcohol per week. She reports that she does not use drugs.  Allergies:  Allergies  Allergen Reactions  . Asa [Aspirin]     Excessive bleeding  . Nsaids     Bleeding, ulcers    (Not in a hospital admission)   Results for orders placed or performed during the hospital encounter of 08/06/18 (from the past 48 hour(s))  Comprehensive metabolic panel     Status: Abnormal   Collection Time: 08/06/18  6:07 PM  Result Value Ref Range   Sodium 137 135 - 145 mmol/L   Potassium 4.3 3.5 - 5.1 mmol/L   Chloride 102 98 - 111 mmol/L   CO2 26 22 - 32 mmol/L   Glucose, Bld 113 (H) 70 - 99 mg/dL   BUN 27 (H) 8 - 23 mg/dL   Creatinine, Ser 1.15 (H) 0.44 - 1.00 mg/dL   Calcium 9.1 8.9 - 10.3 mg/dL   Total Protein 6.7 6.5 - 8.1 g/dL   Albumin 3.5 3.5 - 5.0 g/dL   AST 18 15 - 41 U/L   ALT 13 0 - 44 U/L   Alkaline Phosphatase 66 38  - 126 U/L   Total Bilirubin 0.9 0.3 - 1.2 mg/dL   GFR calc non Af Amer 42 (L) >60 mL/min   GFR calc Af Amer 49 (L) >60 mL/min   Anion gap 9 5 - 15    Comment: Performed at [facility], [address] [place], [place] [address]  Lipase, blood     Status: None   Collection Time: 08/06/18  6:07 PM  Result Value Ref Range   Lipase 18 11 - 51 U/L    Comment: Performed at [facility] [address], [place], [place] [address]  CBC with Differential     Status: Abnormal   Collection Time: 08/06/18  6:07 PM  Result Value Ref Range   WBC 7.9 4.0 - 10.5 K/uL   RBC 4.98 3.87 - 5.11 MIL/uL   Hemoglobin 14.7 12.0 - 15.0 g/dL   HCT 47.2 (H) 36.0 - 46.0 %   MCV 94.8 80.0 - 100.0 fL   MCH 29.5 26.0 - 34.0 pg   MCHC 31.1 30.0 - 36.0 g/dL   RDW 14.9 11.5 -  15.5 %   Platelets 199 150 - 400 K/uL   nRBC 0.0 0.0 - 0.2 %   Neutrophils Relative % 83 %   Neutro Abs 6.6 1.7 - 7.7 K/uL   Lymphocytes Relative 8 %   Lymphs Abs 0.6 (L) 0.7 - 4.0 K/uL   Monocytes Relative 9 %   Monocytes Absolute 0.7 0.1 - 1.0 K/uL   Eosinophils Relative 0 %   Eosinophils Absolute 0.0 0.0 - 0.5 K/uL   Basophils Relative 0 %   Basophils Absolute 0.0 0.0 - 0.1 K/uL   Immature Granulocytes 0 %   Abs Immature Granulocytes 0.03 0.00 - 0.07 K/uL    Comment: Performed at [facility] [address], [place], [place] [address]  I-Stat CG4 Lactic Acid, ED     Status: None   Collection Time: 08/06/18  6:42 PM  Result Value Ref Range   Lactic Acid, Venous 0.93 0.5 - 1.9 mmol/L   Ct Abdomen Pelvis W Contrast  Result Date: 08/06/2018 CLINICAL DATA:  85 year old with abdominal pain and distension. Hernias. Concern for bowel obstruction. EXAM: CT ABDOMEN AND PELVIS WITH CONTRAST TECHNIQUE: Multidetector CT imaging of the abdomen and pelvis was performed using the standard protocol following bolus administration of intravenous contrast. CONTRAST:  46mL OMNIPAQUE IOHEXOL 300 MG/ML  SOLN COMPARISON:  Radiographs  earlier this day. FINDINGS: Lower chest: Mild basilar scarring or atelectasis. No confluent consolidation. No pleural fluid. Hepatobiliary: Multiple low-density subcentimeter lesions scattered throughout the liver, too small to characterize. Layering hyperdensity in the gallbladder may be stones or sludge. No evidence of pericholecystic inflammation or gallbladder wall thickening. No biliary dilatation. Pancreas: Parenchymal atrophy. No ductal dilatation or inflammation. Spleen: Calcified granuloma. Normal in size. No suspicious focal lesion. Adrenals/Urinary Tract: No adrenal nodule. Bilateral renal parenchymal thinning. No hydronephrosis or perinephric edema. Multiple low-density lesions throughout both kidneys, largest representing simple cysts, many lesions are subcentimeter and too small to characterize. No evidence of suspicious solid lesion. Symmetric excretion on delayed phase imaging. Urinary bladder is partially distended, motion artifact and streak artifact from bilateral hip arthroplasties partially obscures evaluation. Stomach/Bowel: Ingested material in the stomach. Diffusely dilated fluid-filled small bowel with transition point in the right inguinal hernia. Exiting small bowel is decompressed. Small amount of mesenteric edema and free fluid in the left abdomen. No pneumatosis or perforation. Advanced diffuse colonic diverticulosis throughout the entire colon. No evidence diverticulitis. Portions the sigmoid colon are obscured by streak artifact. Reported appendectomy. Vascular/Lymphatic: Aortic atherosclerosis and tortuosity. No aneurysm. No abdominopelvic adenopathy. Prominence of adnexal vascularity and dilatation of the left ovarian vein at 7 mm. Reproductive: Prominent periuterine and adnexal vascularity with dilatation of the left ovarian vein. Uterus is atrophic. No adnexal mass. Other: No free air or perforation. Small amount of mesenteric free fluid in the left abdomen, with small amount of  free fluid tracking in the pelvis. No intra-abdominal abscess. Right inguinal hernia contains obstructed small bowel and small amount of free fluid. Musculoskeletal: Bilateral hip arthroplasties. Multilevel degenerative change in the spine. Mild-to-moderate T12 superior endplate compression fracture is unchanged from radiographs 02/11/2018. IMPRESSION: 1. Small bowel obstruction with transition point in a right inguinal hernia. 2. Advanced diffuse colonic diverticulosis without diverticulitis. 3. Layering hyperdensity in the gallbladder consistent with stones/sludge. 4. Prominent adnexal vascularity and dilatation of the left ovarian vein, can be seen with pelvic congestion syndrome. 5.  Aortic Atherosclerosis (ICD10-I70.0). Electronically Signed   By: Keith Rake M.D.   On: 08/06/2018 20:42   Dg Acute  Abd 2+v Abd (supine,erect,decub) + 1v Chest  Result Date: 08/06/2018 CLINICAL DATA:  Abdominal distension and pain for several days EXAM: DG ABDOMEN ACUTE W/ 1V CHEST COMPARISON:  01/22/2017. FINDINGS: Cardiac shadows within normal limits. The lungs are well aerated bilaterally. No focal infiltrate is seen. Stable bibasilar scarring is noted. Multiple dilated loops of small bowel are noted with differential air-fluid levels consistent with small bowel obstruction. Fecal material is noted throughout the colon. Bilateral hip replacements are seen. No free air is noted. Degenerative changes of lumbar spine are seen. IMPRESSION: Changes consistent with at least partial small bowel obstruction without evidence of perforation. CT is recommended for further evaluation. These results will be called to the ordering clinician or representative by the Radiologist Assistant, and communication documented in the PACS or zVision Dashboard. Electronically Signed   By: Inez Catalina M.D.   On: 08/06/2018 16:49    Review of Systems  Constitutional: Negative for chills, fever and malaise/fatigue.  HENT: Negative for ear  discharge, hearing loss and sore throat.   Eyes: Negative for blurred vision and discharge.  Respiratory: Negative for cough and shortness of breath.   Cardiovascular: Negative for chest pain, orthopnea and leg swelling.  Gastrointestinal: Positive for abdominal pain, nausea and vomiting. Negative for constipation, diarrhea and heartburn.  Musculoskeletal: Negative for myalgias and neck pain.  Skin: Negative for itching and rash.  Neurological: Negative for dizziness, focal weakness, seizures and loss of consciousness.  Endo/Heme/Allergies: Negative for environmental allergies. Does not bruise/bleed easily.  Psychiatric/Behavioral: Negative for depression and suicidal ideas.  All other systems reviewed and are negative.   Blood pressure (!) 142/66, pulse 66, temperature 97.7 F (36.5 C), temperature source Oral, resp. rate 16, height 5\' 4"  (1.626 m), weight 65.8 kg, SpO2 98 %. Physical Exam  Constitutional: She is oriented to person, place, and time. Vital signs are normal. She appears well-developed and well-nourished.  Conversant No acute distress  Eyes: Lids are normal. No scleral icterus.  No lid lag Moist conjunctiva  Neck: No tracheal tenderness present. No thyromegaly present.  No cervical lymphadenopathy  Cardiovascular: Normal rate, regular rhythm and intact distal pulses.  No murmur heard. Respiratory: Effort normal and breath sounds normal. She has no wheezes. She has no rales.  GI: There is no hepatosplenomegaly. There is abdominal tenderness (RLQ>LLQ). A hernia is present. Hernia confirmed positive in the right inguinal area (femoral).  Neurological: She is alert and oriented to person, place, and time.  Normal gait and station  Skin: Skin is warm. No rash noted. No cyanosis. Nails show no clubbing.  Normal skin turgor  Psychiatric: Judgment normal.  Appropriate affect     Assessment/Plan 58F with incarcerated R femoral hernia Afib CHF HTN  1. Will consult Cards  to eval if patient OK to be off eliquis or needs to be on a heparin gtt 2. Able to reduce her femoral hernia.  Will hold off on NGT for now.  If pt has reincarceration of her hernia she may require NGT. 3. Will admit, con't NPO for now, Pt will likely need surgery in next 24-48h to alleviate femoral hernia.  Ralene Ok, MD 08/06/2018, 9:32 PM

## 2018-08-06 NOTE — ED Triage Notes (Signed)
Pt here from home. Pt had scans done today and was sent here for a possible bowel obstruction

## 2018-08-06 NOTE — ED Notes (Signed)
Pt gone to CT unable to draw blood at this time

## 2018-08-06 NOTE — Consult Note (Signed)
Cardiology Consultation:   Patient ID: Stephanie Ritter MRN: [id number]; DOB: [birth year]  Admit date: 08/06/2018 Date of Consult: 08/06/2018  Primary Care Provider: Lajean Manes, MD Primary Cardiologist: No primary care provider on file. Dr. Gwenlyn Found Primary Electrophysiologist:  None    Patient Profile:   Stephanie Ritter is a 85 y.o. female with a hx of essential hypertension, dementia, chf, atrial fibrillation status post prior ablation with prior who is being seen today for the evaluation of atrial fibrillation and potential bridging for surgery at the request of  Dr. Rosendo Gros.  History of Present Illness:   Ms. Stephanie Ritter is pleasant lady accompanied with her daughter her for sm all bowel obstruction. Reportedly has several abdominal hernias and has occasional pain there however yesterday began having severe abd with associated nausea and vomiting. Constant pain. Non radiating.   She has been chronically on eliquis for anti-coagulation without issue.    Surgery requests consult regarding need ing bridging. No high risk features of rheumatic heart disease, significant mitral valve disease or prior stroke. CHADS-VASC score 4-5 (htn, >75yo, female, ?chf)  Past Medical History:  Diagnosis Date  . CHF (congestive heart failure) ([place])   . Hypertension   . Persistent atrial fibrillation   . Tinnitus     Past Surgical History:  Procedure Laterality Date  . APPENDECTOMY     60 years ago  . ATRIAL FIBRILLATION ABLATION N/A    8 years ago  . hip replacement Bilateral    4 and 8 years ago     Home Medications:  Prior to Admission medications   Medication Sig Start Date End Date Taking? Authorizing Provider  acetaminophen (TYLENOL) 500 MG tablet Take 500-1,000 mg by mouth every 6 (six) hours as needed for mild pain or headache.   Yes [provider]  amiodarone (PACERONE) 200 MG tablet TAKE 1 TABLET BY MOUTH  DAILY Patient taking differently: Take 200 mg by mouth daily.  05/07/18   Yes Sherran Needs, NP  apixaban (ELIQUIS) 5 MG TABS tablet Take 5 mg by mouth 2 (two) times daily.    Yes [provider]  Carboxymethylcellulose Sodium (EYE DROPS OP) Place 2 drops into both eyes as needed (for dry eyes).    Yes [provider]  diltiazem (CARDIZEM CD) 300 MG 24 hr capsule Take 1 capsule (300 mg total) by mouth daily. 10/25/17  Yes Sherran Needs, NP  escitalopram (LEXAPRO) 20 MG tablet Take 20 mg by mouth daily.  12/27/16  Yes [provider]  furosemide (LASIX) 40 MG tablet Take 20 mg by mouth daily.   Yes [provider]  Multiple Vitamins-Minerals (PRESERVISION/LUTEIN) CAPS Take 1 capsule by mouth 2 (two) times daily.    Yes [provider]    Inpatient Medications: Scheduled Meds:  Continuous Infusions:  PRN Meds:   Allergies:    Allergies  Allergen Reactions  . Asa [Aspirin] Other (See Comments)    Excessive bleeding  . Nsaids     Bleeding, ulcers    Social History:   Social History   Socioeconomic History  . Marital status: Married    Spouse name: Not on file  . Number of children: Not on file  . Years of education: Not on file  . Highest education level: Not on file  Occupational History  . Not on file  Social Needs  . Financial resource strain: Not on file  . Food insecurity:    Worry: Not on file    Inability: Not on  file  . Transportation needs:    Medical: Not on file    Non-medical: Not on file  Tobacco Use  . Smoking status: Former Smoker    Types: Cigarettes  . Smokeless tobacco: Never Used  . Tobacco comment: Pt quit 50+ years ago  Substance and Sexual Activity  . Alcohol use: Yes    Alcohol/week: 1.0 standard drinks    Types: 1 Glasses of wine per week    Comment: occasionally  . Drug use: No  . Sexual activity: Not on file  Lifestyle  . Physical activity:    Days per week: Not on file    Minutes per session: Not on file  . Stress: Not on file  Relationships  . Social  connections:    Talks on phone: Not on file    Gets together: Not on file    Attends religious service: Not on file    Active member of club or organization: Not on file    Attends meetings of clubs or organizations: Not on file    Relationship status: Not on file  . Intimate partner violence:    Fear of current or ex partner: Not on file    Emotionally abused: Not on file    Physically abused: Not on file    Forced sexual activity: Not on file  Other Topics Concern  . Not on file  Social History Narrative  . Not on file    Family History:   No family hx cad Family History  Family history unknown: Yes      Review of Systems: [y] = yes, [ ]  = no   . General: Weight gain [ ] ; Weight loss [ ] ; Anorexia [ ] ; Fatigue [ ] ; Fever [ ] ; Chills [ ] ; Weakness [ ]   . Cardiac: Chest pain/pressure [ ] ; Resting SOB [ ] ; Exertional SOB [ ] ; Orthopnea [ ] ; Pedal Edema [ ] ; Palpitations [ ] ; Syncope [ ] ; Presyncope [ ] ; Paroxysmal nocturnal dyspnea[ ]   . Pulmonary: Cough [ ] ; Wheezing[ ] ; Hemoptysis[ ] ; Sputum [ ] ; Snoring [ ]   . GI: Vomiting[ ] ; Dysphagia[ ] ; Melena[ ] ; Hematochezia [ ] ; Heartburn[ ] ; Abdominal pain Blue.Reese ]; Constipation [ ] ; Diarrhea [ ] ; BRBPR [ ]   . GU: Hematuria[ ] ; Dysuria [ ] ; Nocturia[ ]   . Vascular: Pain in legs with walking [ ] ; Pain in feet with lying flat [ ] ; Non-healing sores [ ] ; Stroke [ ] ; TIA [ ] ; Slurred speech [ ] ;  . Neuro: Headaches[ ] ; Vertigo[ ] ; Seizures[ ] ; Paresthesias[ ] ;Blurred vision [ ] ; Diplopia [ ] ; Vision changes [ ]   . Ortho/Skin: Arthritis [ ] ; Joint pain [ ] ; Muscle pain [ ] ; Joint swelling [ ] ; Back Pain [ ] ; Rash [ ]   . Psych: Depression[ ] ; Anxiety[ ]   . Heme: Bleeding problems [ ] ; Clotting disorders [ ] ; Anemia [ ]   . Endocrine: Diabetes [ ] ; Thyroid dysfunction[ ]   Physical Exam/Data:   Vitals:   08/06/18 1930 08/06/18 1945 08/06/18 2130 08/06/18 2151  BP: (!) 153/66 (!) 142/66 (!) 174/76 (!) 176/72  Pulse: 65 66 73 71  Resp:    16    Temp:      TempSrc:      SpO2: 96% 98% 94% 97%  Weight:      Height:       No intake or output data in the 24 hours ending 08/06/18 2220 Filed Weights   08/06/18 1729  Weight: 65.8 kg   Body  mass index is 24.89 kg/m.  General:  Well nourished, well developed, in no acute distress HEENT: normal Lymph: no adenopathy Neck: no JVD Endocrine:  No thryomegaly Vascular: No carotid bruits; FA pulses 2+ bilaterally without bruits  Cardiac:  normal S1, S2; RRR; no murmur  Lungs:  clear to auscultation bilaterally, no wheezing, rhonchi or rales  Abd: soft, nontender, no hepatomegaly  Ext: no edema Musculoskeletal:  No deformities, BUE and BLE strength normal and equal Skin: warm and dry  Neuro:  CNs 2-12 intact, no focal abnormalities noted Psych:  Normal affect   EKG:  The EKG was personally reviewed and demonstrates:  Sinus with 1st avb and lafb  Relevant CV Studies: none  Laboratory Data:  Chemistry Recent Labs  Lab 08/06/18 1807  NA 137  K 4.3  CL 102  CO2 26  GLUCOSE 113*  BUN 27*  CREATININE 1.15*  CALCIUM 9.1  GFRNONAA 42*  GFRAA 49*  ANIONGAP 9    Recent Labs  Lab 08/06/18 1807  PROT 6.7  ALBUMIN 3.5  AST 18  ALT 13  ALKPHOS 66  BILITOT 0.9   Hematology Recent Labs  Lab 08/06/18 1807  WBC 7.9  RBC 4.98  HGB 14.7  HCT 47.2*  MCV 94.8  MCH 29.5  MCHC 31.1  RDW 14.9  PLT 199   Cardiac EnzymesNo results for input(s): TROPONINI in the last 168 hours. No results for input(s): TROPIPOC in the last 168 hours.  BNPNo results for input(s): BNP, PROBNP in the last 168 hours.  DDimer No results for input(s): DDIMER in the last 168 hours.  Radiology/Studies:  Ct Abdomen Pelvis W Contrast  Result Date: 08/06/2018 CLINICAL DATA:  85 year old with abdominal pain and distension. Hernias. Concern for bowel obstruction. EXAM: CT ABDOMEN AND PELVIS WITH CONTRAST TECHNIQUE: Multidetector CT imaging of the abdomen and pelvis was performed using the standard  protocol following bolus administration of intravenous contrast. CONTRAST:  71mL OMNIPAQUE IOHEXOL 300 MG/ML  SOLN COMPARISON:  Radiographs earlier this day. FINDINGS: Lower chest: Mild basilar scarring or atelectasis. No confluent consolidation. No pleural fluid. Hepatobiliary: Multiple low-density subcentimeter lesions scattered throughout the liver, too small to characterize. Layering hyperdensity in the gallbladder may be stones or sludge. No evidence of pericholecystic inflammation or gallbladder wall thickening. No biliary dilatation. Pancreas: Parenchymal atrophy. No ductal dilatation or inflammation. Spleen: Calcified granuloma. Normal in size. No suspicious focal lesion. Adrenals/Urinary Tract: No adrenal nodule. Bilateral renal parenchymal thinning. No hydronephrosis or perinephric edema. Multiple low-density lesions throughout both kidneys, largest representing simple cysts, many lesions are subcentimeter and too small to characterize. No evidence of suspicious solid lesion. Symmetric excretion on delayed phase imaging. Urinary bladder is partially distended, motion artifact and streak artifact from bilateral hip arthroplasties partially obscures evaluation. Stomach/Bowel: Ingested material in the stomach. Diffusely dilated fluid-filled small bowel with transition point in the right inguinal hernia. Exiting small bowel is decompressed. Small amount of mesenteric edema and free fluid in the left abdomen. No pneumatosis or perforation. Advanced diffuse colonic diverticulosis throughout the entire colon. No evidence diverticulitis. Portions the sigmoid colon are obscured by streak artifact. Reported appendectomy. Vascular/Lymphatic: Aortic atherosclerosis and tortuosity. No aneurysm. No abdominopelvic adenopathy. Prominence of adnexal vascularity and dilatation of the left ovarian vein at 7 mm. Reproductive: Prominent periuterine and adnexal vascularity with dilatation of the left ovarian vein. Uterus is  atrophic. No adnexal mass. Other: No free air or perforation. Small amount of mesenteric free fluid in the left abdomen, with small amount of  free fluid tracking in the pelvis. No intra-abdominal abscess. Right inguinal hernia contains obstructed small bowel and small amount of free fluid. Musculoskeletal: Bilateral hip arthroplasties. Multilevel degenerative change in the spine. Mild-to-moderate T12 superior endplate compression fracture is unchanged from radiographs 02/11/2018. IMPRESSION: 1. Small bowel obstruction with transition point in a right inguinal hernia. 2. Advanced diffuse colonic diverticulosis without diverticulitis. 3. Layering hyperdensity in the gallbladder consistent with stones/sludge. 4. Prominent adnexal vascularity and dilatation of the left ovarian vein, can be seen with pelvic congestion syndrome. 5.  Aortic Atherosclerosis (ICD10-I70.0). Electronically Signed   By: Keith Rake M.D.   On: 08/06/2018 20:42   Dg Acute Abd 2+v Abd (supine,erect,decub) + 1v Chest  Result Date: 08/06/2018 CLINICAL DATA:  Abdominal distension and pain for several days EXAM: DG ABDOMEN ACUTE W/ 1V CHEST COMPARISON:  01/22/2017. FINDINGS: Cardiac shadows within normal limits. The lungs are well aerated bilaterally. No focal infiltrate is seen. Stable bibasilar scarring is noted. Multiple dilated loops of small bowel are noted with differential air-fluid levels consistent with small bowel obstruction. Fecal material is noted throughout the colon. Bilateral hip replacements are seen. No free air is noted. Degenerative changes of lumbar spine are seen. IMPRESSION: Changes consistent with at least partial small bowel obstruction without evidence of perforation. CT is recommended for further evaluation. These results will be called to the ordering clinician or representative by the Radiologist Assistant, and communication documented in the PACS or zVision Dashboard. Electronically Signed   By: Inez Catalina M.D.    On: 08/06/2018 16:49    Assessment and Plan:   1. Small bowel obstruction 2. Atrial fib, chadsvasc ~5   No high risk features of rheumatic heart disease, significant mitral valve disease or prior stroke. CHADS-VASC score ~5 (htn, >75yo, female, ?chf) 3. Essential hypertension  [facility] will sign off.   Medication Recommendations:  Can hold eliquis without heparin bridging. Continue amiodarone and cardizem. Can cont prn hydralazine for bp control. Other recommendations (labs, testing, etc):  none Follow up as an outpatient:  yes  For questions or updates, please contact [place] Please consult [email or web address] for contact info under     Signed, Chancy Milroy, MD  08/06/2018 10:20 PM

## 2018-08-07 ENCOUNTER — Encounter (HOSPITAL_COMMUNITY): Payer: Self-pay

## 2018-08-07 ENCOUNTER — Inpatient Hospital Stay (HOSPITAL_COMMUNITY): Payer: Medicare Other | Admitting: Anesthesiology

## 2018-08-07 ENCOUNTER — Encounter (HOSPITAL_COMMUNITY): Admission: EM | Disposition: A | Discharge status: Home Health | Payer: Self-pay | Source: Home / Self Care

## 2018-08-07 DIAGNOSIS — I110 Hypertensive heart disease with heart failure: Secondary | ICD-10-CM | POA: Diagnosis present

## 2018-08-07 DIAGNOSIS — K429 Umbilical hernia without obstruction or gangrene: Secondary | ICD-10-CM | POA: Diagnosis present

## 2018-08-07 DIAGNOSIS — Z87891 Personal history of nicotine dependence: Secondary | ICD-10-CM | POA: Diagnosis not present

## 2018-08-07 DIAGNOSIS — Z79899 Other long term (current) drug therapy: Secondary | ICD-10-CM | POA: Diagnosis not present

## 2018-08-07 DIAGNOSIS — K4190 Unilateral femoral hernia, without obstruction or gangrene, not specified as recurrent: Secondary | ICD-10-CM | POA: Diagnosis present

## 2018-08-07 DIAGNOSIS — N179 Acute kidney failure, unspecified: Secondary | ICD-10-CM | POA: Diagnosis present

## 2018-08-07 DIAGNOSIS — Z886 Allergy status to analgesic agent status: Secondary | ICD-10-CM | POA: Diagnosis not present

## 2018-08-07 DIAGNOSIS — Z96643 Presence of artificial hip joint, bilateral: Secondary | ICD-10-CM | POA: Diagnosis present

## 2018-08-07 DIAGNOSIS — Z7901 Long term (current) use of anticoagulants: Secondary | ICD-10-CM | POA: Diagnosis not present

## 2018-08-07 DIAGNOSIS — I509 Heart failure, unspecified: Secondary | ICD-10-CM | POA: Diagnosis present

## 2018-08-07 DIAGNOSIS — I4819 Other persistent atrial fibrillation: Secondary | ICD-10-CM | POA: Diagnosis present

## 2018-08-07 DIAGNOSIS — I5041 Acute combined systolic (congestive) and diastolic (congestive) heart failure: Secondary | ICD-10-CM | POA: Diagnosis not present

## 2018-08-07 DIAGNOSIS — R109 Unspecified abdominal pain: Secondary | ICD-10-CM | POA: Diagnosis not present

## 2018-08-07 DIAGNOSIS — K4130 Unilateral femoral hernia, with obstruction, without gangrene, not specified as recurrent: Secondary | ICD-10-CM | POA: Diagnosis present

## 2018-08-07 HISTORY — PX: BOWEL RESECTION: SHX1257

## 2018-08-07 HISTORY — PX: INGUINAL HERNIA REPAIR: SHX194

## 2018-08-07 LAB — URINALYSIS, ROUTINE W REFLEX MICROSCOPIC
Bilirubin Urine: NEGATIVE
Glucose, UA: NEGATIVE mg/dL
Hgb urine dipstick: NEGATIVE
Ketones, ur: NEGATIVE mg/dL
Leukocytes, UA: NEGATIVE
Nitrite: NEGATIVE
Protein, ur: NEGATIVE mg/dL
Specific Gravity, Urine: >1.046 — ABNORMAL HIGH (ref 1.005–1.030)
pH: 5 (ref 5.0–8.0)

## 2018-08-07 LAB — CBC
HCT: 41.1 % (ref 36.0–46.0)
Hemoglobin: 13.4 g/dL (ref 12.0–15.0)
MCH: 30.9 pg (ref 26.0–34.0)
MCHC: 32.6 g/dL (ref 30.0–36.0)
MCV: 94.7 fL (ref 80.0–100.0)
Platelets: 209 10*3/uL (ref 150–400)
RBC: 4.34 MIL/uL (ref 3.87–5.11)
RDW: 14.9 % (ref 11.5–15.5)
WBC: 7.8 10*3/uL (ref 4.0–10.5)
nRBC: 0 % (ref 0.0–0.2)

## 2018-08-07 LAB — TYPE AND SCREEN
ABO/RH(D): O POS
Antibody Screen: NEGATIVE

## 2018-08-07 LAB — SURGICAL PCR SCREEN
MRSA, PCR: NEGATIVE
Staphylococcus aureus: POSITIVE — AB

## 2018-08-07 LAB — ABO/RH: ABO/RH(D): O POS

## 2018-08-07 SURGERY — REPAIR, HERNIA, INGUINAL, LAPAROSCOPIC
Anesthesia: General | Site: Abdomen | Laterality: Right

## 2018-08-07 MED ORDER — ONDANSETRON HCL 4 MG/2ML IJ SOLN
4.0000 mg | Freq: Four times a day (QID) | INTRAMUSCULAR | Status: DC | PRN
  Administered 2018-08-07: 4 mg via INTRAVENOUS

## 2018-08-07 MED ORDER — ROCURONIUM BROMIDE 10 MG/ML (PF) SYRINGE
PREFILLED_SYRINGE | INTRAVENOUS | Status: DC | PRN
  Administered 2018-08-07: 25 mg via INTRAVENOUS
  Administered 2018-08-07: 30 mg via INTRAVENOUS
  Administered 2018-08-07: 20 mg via INTRAVENOUS

## 2018-08-07 MED ORDER — ONDANSETRON HCL 4 MG/2ML IJ SOLN
INTRAMUSCULAR | Status: AC
  Filled 2018-08-07: qty 2

## 2018-08-07 MED ORDER — CEFOTETAN DISODIUM-DEXTROSE 2-2.08 GM-%(50ML) IV SOLR
INTRAVENOUS | Status: AC
  Filled 2018-08-07: qty 50

## 2018-08-07 MED ORDER — ACETAMINOPHEN 325 MG PO TABS
650.0000 mg | ORAL_TABLET | Freq: Four times a day (QID) | ORAL | Status: DC
  Administered 2018-08-07 – 2018-08-09 (×7): 650 mg via ORAL
  Filled 2018-08-07 (×7): qty 2

## 2018-08-07 MED ORDER — DEXAMETHASONE SODIUM PHOSPHATE 4 MG/ML IJ SOLN
INTRAMUSCULAR | Status: DC | PRN
  Administered 2018-08-07: 4 mg via INTRAVENOUS

## 2018-08-07 MED ORDER — BUPIVACAINE-EPINEPHRINE (PF) 0.25% -1:200000 IJ SOLN
INTRAMUSCULAR | Status: AC
  Filled 2018-08-07: qty 30

## 2018-08-07 MED ORDER — PROPOFOL 10 MG/ML IV BOLUS
INTRAVENOUS | Status: DC | PRN
  Administered 2018-08-07: 130 mg via INTRAVENOUS
  Administered 2018-08-07: 60 mg via INTRAVENOUS

## 2018-08-07 MED ORDER — SODIUM CHLORIDE 0.45 % IV SOLN
INTRAVENOUS | Status: DC
  Administered 2018-08-07 – 2018-08-08 (×2): via INTRAVENOUS
  Filled 2018-08-07 (×4): qty 1000

## 2018-08-07 MED ORDER — FENTANYL CITRATE (PF) 100 MCG/2ML IJ SOLN
INTRAMUSCULAR | Status: DC | PRN
  Administered 2018-08-07 (×2): 50 ug via INTRAVENOUS

## 2018-08-07 MED ORDER — ONDANSETRON 4 MG PO TBDP: 4.0000 mg | ORAL_TABLET | Freq: Four times a day (QID) | ORAL | Status: DC | PRN

## 2018-08-07 MED ORDER — SODIUM CHLORIDE 0.9 % IV SOLN
INTRAVENOUS | Status: DC | PRN
  Administered 2018-08-07: 2 g via INTRAVENOUS

## 2018-08-07 MED ORDER — SODIUM CHLORIDE 0.9 % IV SOLN: 2.0000 g | INTRAVENOUS | Status: DC

## 2018-08-07 MED ORDER — 0.9 % SODIUM CHLORIDE (POUR BTL) OPTIME
TOPICAL | Status: DC | PRN
  Administered 2018-08-07: 1000 mL

## 2018-08-07 MED ORDER — METHOCARBAMOL 500 MG PO TABS
500.0000 mg | ORAL_TABLET | Freq: Three times a day (TID) | ORAL | Status: DC | PRN
  Filled 2018-08-07: qty 1

## 2018-08-07 MED ORDER — ROCURONIUM BROMIDE 50 MG/5ML IV SOSY
PREFILLED_SYRINGE | INTRAVENOUS | Status: AC
  Filled 2018-08-07: qty 5

## 2018-08-07 MED ORDER — DILTIAZEM HCL ER COATED BEADS 300 MG PO CP24
300.0000 mg | ORAL_CAPSULE | Freq: Every day | ORAL | Status: DC
  Administered 2018-08-07 – 2018-08-09 (×3): 300 mg via ORAL
  Filled 2018-08-07 (×3): qty 1

## 2018-08-07 MED ORDER — LACTATED RINGERS IV SOLN
INTRAVENOUS | Status: DC | PRN
  Administered 2018-08-07: 13:00:00 via INTRAVENOUS

## 2018-08-07 MED ORDER — LIDOCAINE 2% (20 MG/ML) 5 ML SYRINGE
INTRAMUSCULAR | Status: DC | PRN
  Administered 2018-08-07: 60 mg via INTRAVENOUS

## 2018-08-07 MED ORDER — CHLORHEXIDINE GLUCONATE CLOTH 2 % EX PADS
6.0000 | MEDICATED_PAD | Freq: Once | CUTANEOUS | Status: AC
  Administered 2018-08-07: 6 via TOPICAL

## 2018-08-07 MED ORDER — EPHEDRINE SULFATE 50 MG/ML IJ SOLN
INTRAMUSCULAR | Status: DC | PRN
  Administered 2018-08-07: 5 mg via INTRAVENOUS

## 2018-08-07 MED ORDER — FENTANYL CITRATE (PF) 100 MCG/2ML IJ SOLN: 12.5000 ug | INTRAMUSCULAR | Status: DC | PRN

## 2018-08-07 MED ORDER — CHLORHEXIDINE GLUCONATE CLOTH 2 % EX PADS: 6.0000 | MEDICATED_PAD | Freq: Once | CUTANEOUS | Status: DC

## 2018-08-07 MED ORDER — DEXAMETHASONE SODIUM PHOSPHATE 10 MG/ML IJ SOLN
INTRAMUSCULAR | Status: AC
  Filled 2018-08-07: qty 1

## 2018-08-07 MED ORDER — BUPIVACAINE-EPINEPHRINE 0.25% -1:200000 IJ SOLN
INTRAMUSCULAR | Status: DC | PRN
  Administered 2018-08-07: 10 mL

## 2018-08-07 MED ORDER — SUGAMMADEX SODIUM 200 MG/2ML IV SOLN
INTRAVENOUS | Status: DC | PRN
  Administered 2018-08-07: 150 mg via INTRAVENOUS

## 2018-08-07 MED ORDER — LIDOCAINE 2% (20 MG/ML) 5 ML SYRINGE
INTRAMUSCULAR | Status: AC
  Filled 2018-08-07: qty 5

## 2018-08-07 MED ORDER — HYDRALAZINE HCL 20 MG/ML IJ SOLN: 5.0000 mg | Freq: Four times a day (QID) | INTRAMUSCULAR | Status: DC | PRN

## 2018-08-07 MED ORDER — AMIODARONE HCL 200 MG PO TABS
200.0000 mg | ORAL_TABLET | Freq: Every day | ORAL | Status: DC
  Administered 2018-08-07 – 2018-08-09 (×3): 200 mg via ORAL
  Filled 2018-08-07 (×3): qty 1

## 2018-08-07 MED ORDER — FENTANYL CITRATE (PF) 250 MCG/5ML IJ SOLN
INTRAMUSCULAR | Status: AC
  Filled 2018-08-07: qty 5

## 2018-08-07 MED ORDER — OXYCODONE HCL 5 MG PO TABS: 5.0000 mg | ORAL_TABLET | ORAL | Status: DC | PRN

## 2018-08-07 SURGICAL SUPPLY — 55 items
APPLIER CLIP LOGIC TI 5 (MISCELLANEOUS) IMPLANT
BLADE CLIPPER SURG (BLADE) IMPLANT
BLADE SURG CLIPPER 3M 9600 (MISCELLANEOUS) ×3 IMPLANT
CANISTER SUCT 3000ML PPV (MISCELLANEOUS) ×3 IMPLANT
CHLORAPREP W/TINT 26ML (MISCELLANEOUS) ×3 IMPLANT
COVER SURGICAL LIGHT HANDLE (MISCELLANEOUS) ×3 IMPLANT
COVER WAND RF STERILE (DRAPES) ×3 IMPLANT
DERMABOND ADVANCED (GAUZE/BANDAGES/DRESSINGS) ×1
DERMABOND ADVANCED .7 DNX12 (GAUZE/BANDAGES/DRESSINGS) ×2 IMPLANT
DEVICE SECURE STRAP 25 ABSORB (INSTRUMENTS) ×3 IMPLANT
DRAPE LAPAROSCOPIC ABDOMINAL (DRAPES) ×3 IMPLANT
DRAPE WARM FLUID 44X44 (DRAPE) ×3 IMPLANT
DRSG OPSITE POSTOP 4X10 (GAUZE/BANDAGES/DRESSINGS) IMPLANT
DRSG OPSITE POSTOP 4X8 (GAUZE/BANDAGES/DRESSINGS) IMPLANT
ELECT BLADE 6.5 EXT (BLADE) IMPLANT
ELECT CAUTERY BLADE 6.4 (BLADE) ×3 IMPLANT
ELECT REM PT RETURN 9FT ADLT (ELECTROSURGICAL) ×3
ELECTRODE REM PT RTRN 9FT ADLT (ELECTROSURGICAL) ×2 IMPLANT
GLOVE BIO SURGEON STRL SZ 6 (GLOVE) ×3 IMPLANT
GLOVE INDICATOR 6.5 STRL GRN (GLOVE) ×3 IMPLANT
GOWN STRL REUS W/ TWL LRG LVL3 (GOWN DISPOSABLE) ×6 IMPLANT
GOWN STRL REUS W/TWL LRG LVL3 (GOWN DISPOSABLE) ×3
GRASPER SUT TROCAR 14GX15 (MISCELLANEOUS) ×1 IMPLANT
KIT BASIN OR (CUSTOM PROCEDURE TRAY) ×3 IMPLANT
KIT TURNOVER KIT B (KITS) ×3 IMPLANT
LIGASURE IMPACT 36 18CM CVD LR (INSTRUMENTS) IMPLANT
MESH ULTRAPRO 6X6 15CM15CM (Mesh General) ×1 IMPLANT
NDL INSUFFLATION 14GA 120MM (NEEDLE) ×2 IMPLANT
NEEDLE INSUFFLATION 14GA 120MM (NEEDLE) ×3 IMPLANT
NS IRRIG 1000ML POUR BTL (IV SOLUTION) ×6 IMPLANT
PACK GENERAL/GYN (CUSTOM PROCEDURE TRAY) ×3 IMPLANT
PAD ARMBOARD 7.5X6 YLW CONV (MISCELLANEOUS) ×6 IMPLANT
PENCIL SMOKE EVACUATOR (MISCELLANEOUS) ×3 IMPLANT
SCISSORS LAP 5X35 DISP (ENDOMECHANICALS) ×3 IMPLANT
SET TUBE SMOKE EVAC HIGH FLOW (TUBING) ×3 IMPLANT
SLEEVE ENDOPATH XCEL 5M (ENDOMECHANICALS) ×3 IMPLANT
SPECIMEN JAR LARGE (MISCELLANEOUS) IMPLANT
SPONGE LAP 18X18 X RAY DECT (DISPOSABLE) IMPLANT
STAPLER VISISTAT 35W (STAPLE) ×3 IMPLANT
SUCTION POOLE TIP (SUCTIONS) ×3 IMPLANT
SUT MNCRL AB 4-0 PS2 18 (SUTURE) ×3 IMPLANT
SUT PDS AB 1 TP1 96 (SUTURE) ×6 IMPLANT
SUT SILK 2 0 SH CR/8 (SUTURE) ×3 IMPLANT
SUT SILK 2 0 TIES 10X30 (SUTURE) ×3 IMPLANT
SUT SILK 3 0 SH CR/8 (SUTURE) ×3 IMPLANT
SUT SILK 3 0 TIES 10X30 (SUTURE) ×3 IMPLANT
SUT VIC AB 3-0 SH 18 (SUTURE) IMPLANT
TOWEL OR 17X24 6PK STRL BLUE (TOWEL DISPOSABLE) ×3 IMPLANT
TOWEL OR 17X26 10 PK STRL BLUE (TOWEL DISPOSABLE) ×3 IMPLANT
TRAY FOLEY CATH SILVER 16FR (SET/KITS/TRAYS/PACK) ×3 IMPLANT
TRAY FOLEY MTR SLVR 16FR STAT (SET/KITS/TRAYS/PACK) IMPLANT
TRAY LAPAROSCOPIC MC (CUSTOM PROCEDURE TRAY) ×3 IMPLANT
TROCAR XCEL 12X100 BLDLESS (ENDOMECHANICALS) ×3 IMPLANT
TROCAR XCEL NON-BLD 5MMX100MML (ENDOMECHANICALS) ×3 IMPLANT
WATER STERILE IRR 1000ML POUR (IV SOLUTION) ×3 IMPLANT

## 2018-08-07 NOTE — Progress Notes (Signed)
[facility] Progress Note     Subjective: CC-  Patient comfortable this morning. States that her right groin is still sore, but overall pain much improved. She denies any nausea or vomiting. Thinks she may have passed some flatus.  Objective: Vital signs in last 24 hours: Temp:  [97.5 F (36.4 C)-98.5 F (36.9 C)] 98.5 F (36.9 C) (01/08 0640) Pulse Rate:  [65-73] 73 (01/08 0640) Resp:  [16-17] 17 (01/08 0640) BP: (125-176)/(56-76) 126/56 (01/08 0640) SpO2:  [92 %-98 %] 94 % (01/08 0640) Weight:  [65.8 kg] 65.8 kg (01/07 1729) Last BM Date: 08/06/18  Intake/Output from previous day: No intake/output data recorded. Intake/Output this shift: No intake/output data recorded.  PE: Gen:  Alert, NAD, pleasant HEENT: EOM's intact, pupils equal and round Card:  RRR Pulm:  CTAB, no W/R/R, effort normal Abd: Soft, mild distension, mild subjective diffuse TTP, +BS, no HSM, small umbilical hernia without incarceration, right femoral hernia slightly tender and possible still containing some small bowel Ext:  Calves soft and nontender Psych: A&Ox3  Skin: no rashes noted, warm and dry  Lab Results:  Recent Labs    08/06/18 1807 08/07/18 0234  WBC 7.9 7.8  HGB 14.7 13.4  HCT 47.2* 41.1  PLT 199 209   BMET Recent Labs    08/06/18 1807  NA 137  K 4.3  CL 102  CO2 26  GLUCOSE 113*  BUN 27*  CREATININE 1.15*  CALCIUM 9.1   PT/INR No results for input(s): LABPROT, INR in the last 72 hours. CMP     Component Value Date/Time   NA 137 08/06/2018 1807   NA 139 06/05/2017 1446   K 4.3 08/06/2018 1807   CL 102 08/06/2018 1807   CO2 26 08/06/2018 1807   GLUCOSE 113 (H) 08/06/2018 1807   BUN 27 (H) 08/06/2018 1807   BUN 28 (H) 06/05/2017 1446   CREATININE 1.15 (H) 08/06/2018 1807   CALCIUM 9.1 08/06/2018 1807   PROT 6.7 08/06/2018 1807   PROT 6.4 06/05/2017 1446   ALBUMIN 3.5 08/06/2018 1807   ALBUMIN 3.8 06/05/2017 1446   AST 18 08/06/2018 1807   ALT  13 08/06/2018 1807   ALKPHOS 66 08/06/2018 1807   BILITOT 0.9 08/06/2018 1807   BILITOT 0.4 06/05/2017 1446   GFRNONAA 42 (L) 08/06/2018 1807   GFRAA 49 (L) 08/06/2018 1807   Lipase     Component Value Date/Time   LIPASE 18 08/06/2018 1807       Studies/Results: Ct Abdomen Pelvis W Contrast  Result Date: 08/06/2018 CLINICAL DATA:  85 year old with abdominal pain and distension. Hernias. Concern for bowel obstruction. EXAM: CT ABDOMEN AND PELVIS WITH CONTRAST TECHNIQUE: Multidetector CT imaging of the abdomen and pelvis was performed using the standard protocol following bolus administration of intravenous contrast. CONTRAST:  22mL OMNIPAQUE IOHEXOL 300 MG/ML  SOLN COMPARISON:  Radiographs earlier this day. FINDINGS: Lower chest: Mild basilar scarring or atelectasis. No confluent consolidation. No pleural fluid. Hepatobiliary: Multiple low-density subcentimeter lesions scattered throughout the liver, too small to characterize. Layering hyperdensity in the gallbladder may be stones or sludge. No evidence of pericholecystic inflammation or gallbladder wall thickening. No biliary dilatation. Pancreas: Parenchymal atrophy. No ductal dilatation or inflammation. Spleen: Calcified granuloma. Normal in size. No suspicious focal lesion. Adrenals/Urinary Tract: No adrenal nodule. Bilateral renal parenchymal thinning. No hydronephrosis or perinephric edema. Multiple low-density lesions throughout both kidneys, largest representing simple cysts, many lesions are subcentimeter and too small to characterize. No evidence of suspicious solid  lesion. Symmetric excretion on delayed phase imaging. Urinary bladder is partially distended, motion artifact and streak artifact from bilateral hip arthroplasties partially obscures evaluation. Stomach/Bowel: Ingested material in the stomach. Diffusely dilated fluid-filled small bowel with transition point in the right inguinal hernia. Exiting small bowel is decompressed.  Small amount of mesenteric edema and free fluid in the left abdomen. No pneumatosis or perforation. Advanced diffuse colonic diverticulosis throughout the entire colon. No evidence diverticulitis. Portions the sigmoid colon are obscured by streak artifact. Reported appendectomy. Vascular/Lymphatic: Aortic atherosclerosis and tortuosity. No aneurysm. No abdominopelvic adenopathy. Prominence of adnexal vascularity and dilatation of the left ovarian vein at 7 mm. Reproductive: Prominent periuterine and adnexal vascularity with dilatation of the left ovarian vein. Uterus is atrophic. No adnexal mass. Other: No free air or perforation. Small amount of mesenteric free fluid in the left abdomen, with small amount of free fluid tracking in the pelvis. No intra-abdominal abscess. Right inguinal hernia contains obstructed small bowel and small amount of free fluid. Musculoskeletal: Bilateral hip arthroplasties. Multilevel degenerative change in the spine. Mild-to-moderate T12 superior endplate compression fracture is unchanged from radiographs 02/11/2018. IMPRESSION: 1. Small bowel obstruction with transition point in a right inguinal hernia. 2. Advanced diffuse colonic diverticulosis without diverticulitis. 3. Layering hyperdensity in the gallbladder consistent with stones/sludge. 4. Prominent adnexal vascularity and dilatation of the left ovarian vein, can be seen with pelvic congestion syndrome. 5.  Aortic Atherosclerosis (ICD10-I70.0). Electronically Signed   By: Keith Rake M.D.   On: 08/06/2018 20:42   Dg Acute Abd 2+v Abd (supine,erect,decub) + 1v Chest  Result Date: 08/06/2018 CLINICAL DATA:  Abdominal distension and pain for several days EXAM: DG ABDOMEN ACUTE W/ 1V CHEST COMPARISON:  01/22/2017. FINDINGS: Cardiac shadows within normal limits. The lungs are well aerated bilaterally. No focal infiltrate is seen. Stable bibasilar scarring is noted. Multiple dilated loops of small bowel are noted with  differential air-fluid levels consistent with small bowel obstruction. Fecal material is noted throughout the colon. Bilateral hip replacements are seen. No free air is noted. Degenerative changes of lumbar spine are seen. IMPRESSION: Changes consistent with at least partial small bowel obstruction without evidence of perforation. CT is recommended for further evaluation. These results will be called to the ordering clinician or representative by the Radiologist Assistant, and communication documented in the PACS or zVision Dashboard. Electronically Signed   By: Inez Catalina M.D.   On: 08/06/2018 16:49    Anti-infectives: Anti-infectives (From admission, onward)   None       Assessment/Plan Afib on Eliquis (last dose 1/6 PM), amiodarone, cardizem CHF HTN - hydralazine PRN AKI - Cr 1.15, continue gentle IVF  Incarcerated right femoral hernia - Appreciate cardiology consult, patient with no high risk features, ok to hold eliquis without heparin bridge and proceed with surgery.  Plan for surgery this afternoon as that will be about 48 hours off eliquis: laparoscopic possible open right femoral hernia repair with mesh, possible bowel resection. Keep NPO.  ID - [email or web address] on call to OR FEN - IVF, NPO VTE - SCDs, hold eliquis Foley - none   LOS: 0 days    Wellington Hampshire , [facility] 08/07/2018, 9:41 AM Pager: [phone] Mon 7:00 am -11:30 AM Tues-Fri 7:00 am-4:30 pm Sat-Sun 7:00 am-11:30 am

## 2018-08-07 NOTE — Anesthesia Procedure Notes (Signed)
Procedure Name: Intubation Date/Time: 08/07/2018 1:56 PM Performed by: Eulas Post, Arely Tinner W, CRNA Pre-anesthesia Checklist: Patient identified, Emergency Drugs available, Suction available and Patient being monitored Patient Re-evaluated:Patient Re-evaluated prior to induction Oxygen Delivery Method: Circle system utilized Preoxygenation: Pre-oxygenation with 100% oxygen Induction Type: IV induction Ventilation: Mask ventilation without difficulty Laryngoscope Size: Miller, 2, Mac, 3 and Glidescope Grade View: Grade II Tube type: Oral Number of attempts: 3 Airway Equipment and Method: Oral airway Placement Confirmation: ETT inserted through vocal cords under direct vision,  positive ETCO2 and breath sounds checked- equal and bilateral Tube secured with: Tape Dental Injury: Teeth and Oropharynx as per pre-operative assessment

## 2018-08-07 NOTE — Progress Notes (Signed)
Received pt alert and oriented. Daughter at bedside. Pt ambulated to the bathroom and to the bed with a cane and standby assist. Pt denies pain and nausea at this time. Oriented pt and family with room and use of call light. Will monitor pt.

## 2018-08-07 NOTE — Discharge Instructions (Signed)
[facility] _______[facility] ° °UMBILICAL OR INGUINAL HERNIA REPAIR: POST OP INSTRUCTIONS ° °Always review your discharge instruction sheet given to you by the facility where your surgery was performed. °IF YOU HAVE DISABILITY OR FAMILY LEAVE FORMS, YOU MUST BRING THEM TO THE OFFICE FOR PROCESSING.   °DO NOT GIVE THEM TO YOUR DOCTOR. ° °1. A  prescription for pain medication may be given to you upon discharge.  Take your pain medication as prescribed, if needed.  If narcotic pain medicine is not needed, then you may take acetaminophen (Tylenol) or ibuprofen (Advil) as needed. °2. Take your usually prescribed medications unless otherwise directed. °If you need a refill on your pain medication, please contact your pharmacy.  They will contact our office to request authorization. Prescriptions will not be filled after 5 pm or on week-ends. °3. You should follow a light diet the first 24 hours after arrival home, such as soup and crackers, etc.  Be sure to include lots of fluids daily.  Resume your normal diet the day after surgery. °4.Most patients will experience some swelling and bruising around the umbilicus or in the groin and scrotum.  Ice packs and reclining will help.  Swelling and bruising can take several days to resolve.  °6. It is common to experience some constipation if taking pain medication after surgery.  Increasing fluid intake and taking a stool softener (such as Colace) will usually help or prevent this problem from occurring.  A mild laxative (Milk of Magnesia or Miralax) should be taken according to package directions if there are no bowel movements after 48 hours. °7. Unless discharge instructions indicate otherwise, you may remove your bandages 24-48 hours after surgery, and you may shower at that time.  You may have steri-strips (small skin tapes) in place directly over the incision.  These strips should be left on the skin for 7-10 days.  If your surgeon used skin glue on the  incision, you may shower in 24 hours.  The glue will flake off over the next 2-3 weeks.  Any sutures or staples will be removed at the office during your follow-up visit. °8. ACTIVITIES:  You may resume regular (light) daily activities beginning the next day--such as daily self-care, walking, climbing stairs--gradually increasing activities as tolerated.  You may have sexual intercourse when it is comfortable.  Refrain from any heavy lifting or straining until approved by your doctor. ° °a.You may drive when you are no longer taking prescription pain medication, you can comfortably wear a seatbelt, and you can safely maneuver your car and apply brakes. °b.RETURN TO WORK:   °_____________________________________________ ° °9.You should see your doctor in the office for a follow-up appointment approximately 2-3 weeks after your surgery.  Make sure that you call for this appointment within a day or two after you arrive home to insure a convenient appointment time. °10.OTHER INSTRUCTIONS: _________________________ °   _____________________________________ ° °WHEN TO CALL YOUR DOCTOR: °1. Fever over 101.0 °2. Inability to urinate °3. Nausea and/or vomiting °4. Extreme swelling or bruising °5. Continued bleeding from incision. °6. Increased pain, redness, or drainage from the incision ° °The clinic staff is available to answer your questions during regular business hours.  Please don’t hesitate to call and ask to speak to one of the nurses for clinical concerns.  If you have a medical emergency, go to the nearest emergency room or call 911.  A surgeon from [facility] is always on call at the hospital ° ° °  [address], [address], [place], [place]  [address] ?  [address] [address], [place], [place]   [address] [phone] ? [phone] ? FAX [phone] Web site: [email or web address]   GETTING TO GOOD BOWEL HEALTH. Irregular bowel habits such as constipation and diarrhea can lead to many  problems over time.  Having one soft bowel movement a day is the most important way to prevent further problems.  The anorectal canal is designed to handle stretching and feces to safely manage our ability to get rid of solid waste (feces, poop, stool) out of our body.  BUT, hard constipated stools can act like ripping concrete bricks and diarrhea can be a burning fire to this very sensitive area of our body, causing inflamed hemorrhoids, anal fissures, increasing risk is perirectal abscesses, abdominal pain/bloating, an making irritable bowel worse.     The goal: ONE SOFT BOWEL MOVEMENT A DAY!  To have soft, regular bowel movements:  . Drink at least 8 tall glasses of water a day.   . Take plenty of fiber.  Fiber is the undigested part of plant food that passes into the colon, acting s "natures broom" to encourage bowel motility and movement.  Fiber can absorb and hold large amounts of water. This results in a larger, bulkier stool, which is soft and easier to pass. Work gradually over several weeks up to 6 servings a day of fiber (25g a day even more if needed) in the form of: o Vegetables -- Root (potatoes, carrots, turnips), leafy green (lettuce, salad greens, celery, spinach), or cooked high residue (cabbage, broccoli, etc) o Fruit -- Fresh (unpeeled skin & pulp), Dried (prunes, apricots, cherries, etc ),  or stewed ( applesauce)  o Whole grain breads, pasta, etc (whole wheat)  o Bran cereals  . Bulking Agents -- This type of water-retaining fiber generally is easily obtained each day by one of the following:  o Psyllium bran -- The psyllium plant is remarkable because its ground seeds can retain so much water. This product is available as Metamucil, Konsyl, Effersyllium, Per Diem Fiber, or the less expensive generic preparation in drug and health food stores. Although labeled a laxative, it really is not a laxative.  o Methylcellulose -- This is another fiber derived from wood which also retains  water. It is available as Citrucel. o Polyethylene Glycol - and "artificial" fiber commonly called Miralax or Glycolax.  It is helpful for people with gassy or bloated feelings with regular fiber o Flax Seed - a less gassy fiber than psyllium . No reading or other relaxing activity while on the toilet. If bowel movements take longer than 5 minutes, you are too constipated . AVOID CONSTIPATION.  High fiber and water intake usually takes care of this.  Sometimes a laxative is needed to stimulate more frequent bowel movements, but  . Laxatives are not a good long-term solution as it can wear the colon out. o Osmotics (Milk of Magnesia, Fleets phosphosoda, Magnesium citrate, MiraLax, GoLytely) are safer than  o Stimulants (Senokot, Castor Oil, Dulcolax, Ex Lax)    o Do not take laxatives for more than 7days in a row. .  IF SEVERELY CONSTIPATED, try a Bowel Retraining Program: o Do not use laxatives.  o Eat a diet high in roughage, such as bran cereals and leafy vegetables.  o Drink six (6) ounces of prune or apricot juice each morning.  o Eat two (2) large servings of stewed fruit each day.  o Take one (1)   heaping tablespoon of a psyllium-based bulking agent twice a day. Use sugar-free sweetener when possible to avoid excessive calories.  o Eat a normal breakfast.  o Set aside 15 minutes after breakfast to sit on the toilet, but do not strain to have a bowel movement.  o If you do not have a bowel movement by the third day, use an enema and repeat the above steps.  . Controlling diarrhea o Switch to liquids and simpler foods for a few days to avoid stressing your intestines further. o Avoid dairy products (especially milk & ice cream) for a short time.  The intestines often can lose the ability to digest lactose when stressed. o Avoid foods that cause gassiness or bloating.  Typical foods include beans and other legumes, cabbage, broccoli, and dairy foods.  Every person has some sensitivity to other  foods, so listen to our body and avoid those foods that trigger problems for you. o Adding fiber (Citrucel, Metamucil, psyllium, Miralax) gradually can help thicken stools by absorbing excess fluid and retrain the intestines to act more normally.  Slowly increase the dose over a few weeks.  Too much fiber too soon can backfire and cause cramping & bloating. o Probiotics (such as active yogurt, Align, etc) may help repopulate the intestines and colon with normal bacteria and calm down a sensitive digestive tract.  Most studies show it to be of mild help, though, and such products can be costly. o Medicines: - Bismuth subsalicylate (ex. Kayopectate, Pepto Bismol) every 30 minutes for up to 6 doses can help control diarrhea.  Avoid if pregnant. - Loperamide (Immodium) can slow down diarrhea.  Start with two tablets (4mg total) first and then try one tablet every 6 hours.  Avoid if you are having fevers or severe pain.  If you are not better or start feeling worse, stop all medicines and call your doctor for advice o Call your doctor if you are getting worse or not better.  Sometimes further testing (cultures, endoscopy, X-ray studies, bloodwork, etc) may be needed to help diagnose and treat the cause of the diarrhea. 

## 2018-08-07 NOTE — Anesthesia Postprocedure Evaluation (Signed)
Anesthesia Post Note  Patient: Stephanie Ritter  Procedure(s) Performed: Laparoscopic right femoral hernia repair with mesh, possible open, possible bowel resection (Right Abdomen) SMALL BOWEL RESECTION (N/A Abdomen)     Patient location during evaluation: PACU Anesthesia Type: General Level of consciousness: sedated, patient cooperative and oriented Pain management: pain level controlled Vital Signs Assessment: post-procedure vital signs reviewed and stable Respiratory status: spontaneous breathing, nonlabored ventilation, respiratory function stable and patient connected to nasal cannula oxygen Cardiovascular status: blood pressure returned to baseline and stable Postop Assessment: no apparent nausea or vomiting Anesthetic complications: no    Last Vitals:  Vitals:   08/07/18 1029 08/07/18 1535  BP: (!) 135/59 (!) 134/54  Pulse: 74 73  Resp:  20  Temp: 36.8 C 36.4 C  SpO2: 93% 99%    Last Pain:  Vitals:   08/07/18 1535  TempSrc:   PainSc: 0-No pain                 Debra Colon,E. Nashea Chumney

## 2018-08-07 NOTE — Op Note (Signed)
Operative Note  Stephanie Ritter  [id number]  [id number]  08/07/2018   Surgeon: Victorino Sparrow ConnorMD  Assistant: Margie Billet PA-C  Procedure performed: laparoscopic repair of incarcerated right femoral hernia ( transabdominal preperitoneal, ultra Pro mesh, Vicryl tacks)  Preop diagnosis: incarcerated right femoral hernia, bowel reduced yesterday evening Post-op diagnosis/intraop findings: same, with chronically incarcerated inspissated fat in the defect which was reduced. Bowel was all viable  Specimens: none Retained items: no EBL: minimal cc Complications: none  Description of procedure: After obtaining informed consent the patient was taken to the operating room and placed supine on operating room table wheregeneral endotracheal anesthesia was initiated, preoperative antibiotics were administered, SCDs applied, and a formal timeout was performed. Foley catheter was inserted which will be removed at the end of the case. The abdomen was prepped and draped in usual sterile fashion and then peritoneal access was gained using optical entry in the right upper quadrant. Abdomen was insufflated to 15 mmHg without incident and gross inspection revealed no evidence of injury from our entry. Under direct visualization, a left lateral 5 motor trocar was placed and a 12 mm trocar was placed through the patient's small umbilical hernia. The patient was then placed in Trendelenburg and the pelvis inspected. There is no hernia on the left side. On the right side there is a femoral hernia with a narrow neck and chronically thickened peritoneum. There is no remaining incarcerated bowel. The bowel was inspected several times throughout the case and there was one very short segment with a serosal erythema, but there is no evidence of bowel ischemia or compromise and the bowel looked completely healthy and viable. The peritoneal flap was developed using sharp dissection and cautery spanning from the anterior  superior iliac spine to the medial umbilical ligament. This proceeded until the incarcerated hernia sac and chronically incarcerated preperitoneal fat could be completely reduced. This was done carefully so as not to incur any injury to the adjacent femoral vein. The flap was developed inferiorly until the iliac vessels were well exposed and there was plenty of room for the mesh. The Cooper's limit was exposed. The round ligament was divided sharply and with cautery. At this point a 6 x 6 ultra Pro mesh was brought onto the field and trimmed to approximately 3 x 6 in (there was no 3x6 mesh available) to suit the defect. This was introduced through the 12 motor trocar and placed to completely cover the hernia defect as well as the in direct and direct spaces with more than sufficient overlap of the femoral hernia defect. The mesh was secured to the Cooper's ligament using the Vicryl tacker and then superiorly on either side of the inferior epigastric vessels. The peritoneal flap was then brought back up to cover the mesh, ensuring that the mesh did not buckle or fold while doing so. The peritoneum was reapproximated to the abdominal wall using the Vicryl tacker. The hernia sac was tacked up to the abdominal wall as well to reduce seroma formation.  Upon completion there is no exposed mesh. Hemostasis was ensured. The bowel was once more inspected and confirmed to be free of injury or compromise. The 12 mm trocar site at the small umbilical hernia was closed with interrupted 0 Vicryls using a laparoscopic suture passer. Each of the port sites was anesthetized with local and a laparoscopic directed taps block was performed on the right side. The abdomen was then desufflated and the remaining trochars removed. Skin incisions were closed with subcuticular Monocryl  and Dermabond. The patient was then awakened, extubated and taken to PACU in stable condition.   All counts were correct at the completion of the case.

## 2018-08-07 NOTE — Transfer of Care (Signed)
Immediate Anesthesia Transfer of Care Note  Patient: Stephanie Ritter  Procedure(s) Performed: Laparoscopic right femoral hernia repair with mesh, possible open, possible bowel resection (Right Abdomen) SMALL BOWEL RESECTION (N/A Abdomen)  Patient Location: PACU  Anesthesia Type:General  Level of Consciousness: awake and drowsy  Airway & Oxygen Therapy: Patient Spontanous Breathing and Patient connected to nasal cannula oxygen  Post-op Assessment: Report given to RN and Post -op Vital signs reviewed and stable  Post vital signs: Reviewed and stable  Last Vitals:  Vitals Value Taken Time  BP 134/54 08/07/2018  3:35 PM  Temp 36.4 C 08/07/2018  3:35 PM  Pulse 72 08/07/2018  3:37 PM  Resp 22 08/07/2018  3:37 PM  SpO2 99 % 08/07/2018  3:37 PM  Vitals shown include unvalidated device data.  Last Pain:  Vitals:   08/07/18 1029  TempSrc: Oral  PainSc:          Complications: No apparent anesthesia complications

## 2018-08-07 NOTE — Anesthesia Preprocedure Evaluation (Addendum)
Anesthesia Evaluation  Patient identified by MRN, date of birth, ID band Patient awake    Reviewed: Allergy & Precautions, NPO status , Patient's Chart, lab work & pertinent test results  History of Anesthesia Complications Negative for: history of anesthetic complications  Airway Mallampati: II  TM Distance: >3 FB Neck ROM: Full    Dental  (+) Chipped, Dental Advisory Given   Pulmonary former smoker,    breath sounds clear to auscultation       Cardiovascular hypertension, Pt. on medications (-) angina+ dysrhythmias Atrial Fibrillation  Rhythm:Regular Rate:Normal  '18 ECHO: EF 70%, mild-mod MR, mild-mod TR   Neuro/Psych negative neurological ROS     GI/Hepatic Neg liver ROS, N/v with small bowel obstruction   Endo/Other  negative endocrine ROS  Renal/GU negative Renal ROS     Musculoskeletal   Abdominal   Peds  Hematology eliquis   Anesthesia Other Findings S/p MOHs surgery to nose  Reproductive/Obstetrics                            Anesthesia Physical Anesthesia Plan  ASA: III  Anesthesia Plan: General   Post-op Pain Management:    Induction: Intravenous  PONV Risk Score and Plan: 3 and Ondansetron, Dexamethasone and Treatment may vary due to age or medical condition  Airway Management Planned: Oral ETT  Additional Equipment:   Intra-op Plan:   Post-operative Plan: Extubation in OR  Informed Consent: I have reviewed the patients History and Physical, chart, labs and discussed the procedure including the risks, benefits and alternatives for the proposed anesthesia with the patient or authorized representative who has indicated his/her understanding and acceptance.   Dental advisory given  Plan Discussed with: Surgeon and CRNA  Anesthesia Plan Comments:        Anesthesia Quick Evaluation

## 2018-08-08 ENCOUNTER — Encounter (HOSPITAL_COMMUNITY): Payer: Self-pay | Admitting: Surgery

## 2018-08-08 LAB — CBC
HCT: 42.5 % (ref 36.0–46.0)
Hemoglobin: 13.5 g/dL (ref 12.0–15.0)
MCH: 30.2 pg (ref 26.0–34.0)
MCHC: 31.8 g/dL (ref 30.0–36.0)
MCV: 95.1 fL (ref 80.0–100.0)
Platelets: 200 10*3/uL (ref 150–400)
RBC: 4.47 MIL/uL (ref 3.87–5.11)
RDW: 14.9 % (ref 11.5–15.5)
WBC: 9 10*3/uL (ref 4.0–10.5)
nRBC: 0 % (ref 0.0–0.2)

## 2018-08-08 LAB — BASIC METABOLIC PANEL
Anion gap: 10 (ref 5–15)
BUN: 18 mg/dL (ref 8–23)
CO2: 25 mmol/L (ref 22–32)
Calcium: 8.4 mg/dL — ABNORMAL LOW (ref 8.9–10.3)
Chloride: 101 mmol/L (ref 98–111)
Creatinine, Ser: 1.12 mg/dL — ABNORMAL HIGH (ref 0.44–1.00)
GFR calc Af Amer: 50 mL/min — ABNORMAL LOW (ref >60)
GFR calc non Af Amer: 44 mL/min — ABNORMAL LOW (ref >60)
Glucose, Bld: 135 mg/dL — ABNORMAL HIGH (ref 70–99)
Potassium: 4.6 mmol/L (ref 3.5–5.1)
Sodium: 136 mmol/L (ref 135–145)

## 2018-08-08 MED ORDER — MUPIROCIN 2 % EX OINT
1.0000 "application " | TOPICAL_OINTMENT | Freq: Two times a day (BID) | CUTANEOUS | Status: DC
  Administered 2018-08-08 – 2018-08-09 (×3): 1 via NASAL
  Filled 2018-08-08: qty 22

## 2018-08-08 MED ORDER — TETRAHYDROZOLINE HCL 0.05 % OP SOLN
2.0000 [drp] | OPHTHALMIC | Status: DC | PRN
  Filled 2018-08-08: qty 15

## 2018-08-08 MED ORDER — DOCUSATE SODIUM 100 MG PO CAPS
100.0000 mg | ORAL_CAPSULE | Freq: Two times a day (BID) | ORAL | Status: DC
  Administered 2018-08-08 – 2018-08-09 (×3): 100 mg via ORAL
  Filled 2018-08-08 (×3): qty 1

## 2018-08-08 MED ORDER — ESCITALOPRAM OXALATE 20 MG PO TABS
20.0000 mg | ORAL_TABLET | Freq: Every day | ORAL | Status: DC
  Administered 2018-08-08 – 2018-08-09 (×2): 20 mg via ORAL
  Filled 2018-08-08 (×2): qty 1

## 2018-08-08 MED ORDER — CHLORHEXIDINE GLUCONATE CLOTH 2 % EX PADS
6.0000 | MEDICATED_PAD | Freq: Every day | CUTANEOUS | Status: DC
  Administered 2018-08-08 – 2018-08-09 (×2): 6 via TOPICAL

## 2018-08-08 MED ORDER — APIXABAN 5 MG PO TABS
5.0000 mg | ORAL_TABLET | Freq: Two times a day (BID) | ORAL | Status: DC
  Administered 2018-08-08 – 2018-08-09 (×2): 5 mg via ORAL
  Filled 2018-08-08 (×2): qty 1

## 2018-08-08 NOTE — Progress Notes (Signed)
1 Day Post-Op    CC: Incarcerated right femoral hernia -reduced  Subjective: Patient sitting up in the chair.  She has not had a lot of of oral intake but is doing well with what she has had.  Her sites are sore but okay.  She has some stitches in her nose which needs to come out so we will pull those today for her.  Objective: Vital signs in last 24 hours: Temp:  [97.3 F (36.3 C)-98.1 F (36.7 C)] 98 F (36.7 C) (01/09 0535) Pulse Rate:  [65-74] 65 (01/09 0535) Resp:  [16-20] 16 (01/09 0535) BP: (121-134)/(48-54) 121/54 (01/09 0535) SpO2:  [94 %-99 %] 98 % (01/09 0535) Last BM Date: 08/06/18 20 p.o. recorded 1511 IV 575 urine Afebrile vital signs are stable Creatinine 1.15 >> 1.12 WBC is 9.0 H&H is stable, platelets 200 K   Intake/Output from previous day: 01/08 0701 - 01/09 0700 In: 1881.2 [P.O.:320; I.V.:1511.2] Out: 578 [Urine:575; Blood:3] Intake/Output this shift: Total I/O In: 220 [P.O.:220] Out: -   General appearance: alert, cooperative and no distress Resp: clear to auscultation bilaterally GI: Her abdomen is sore but it looks fine.  Port sites all look good.  Lab Results:  Recent Labs    08/07/18 0234 08/08/18 0406  WBC 7.8 9.0  HGB 13.4 13.5  HCT 41.1 42.5  PLT 209 200    BMET Recent Labs    08/06/18 1807 08/08/18 0406  NA 137 136  K 4.3 4.6  CL 102 101  CO2 26 25  GLUCOSE 113* 135*  BUN 27* 18  CREATININE 1.15* 1.12*  CALCIUM 9.1 8.4*   PT/INR No results for input(s): LABPROT, INR in the last 72 hours.  Recent Labs  Lab 08/06/18 1807  AST 18  ALT 13  ALKPHOS 66  BILITOT 0.9  PROT 6.7  ALBUMIN 3.5     Lipase     Component Value Date/Time   LIPASE 18 08/06/2018 1807   Prior to Admission medications   Medication Sig Start Date End Date Taking? Authorizing Provider  acetaminophen (TYLENOL) 500 MG tablet Take 500-1,000 mg by mouth every 6 (six) hours as needed for mild pain or headache.   Yes [provider]   amiodarone (PACERONE) 200 MG tablet TAKE 1 TABLET BY MOUTH  DAILY Patient taking differently: Take 200 mg by mouth daily.  05/07/18  Yes Sherran Needs, NP  apixaban (ELIQUIS) 5 MG TABS tablet Take 5 mg by mouth 2 (two) times daily.    Yes [provider]  Carboxymethylcellulose Sodium (EYE DROPS OP) Place 2 drops into both eyes as needed (for dry eyes).    Yes [provider]  diltiazem (CARDIZEM CD) 300 MG 24 hr capsule Take 1 capsule (300 mg total) by mouth daily. 10/25/17  Yes Sherran Needs, NP  escitalopram (LEXAPRO) 20 MG tablet Take 20 mg by mouth daily.  12/27/16  Yes [provider]  furosemide (LASIX) 40 MG tablet Take 20 mg by mouth daily.   Yes [provider]  Multiple Vitamins-Minerals (PRESERVISION/LUTEIN) CAPS Take 1 capsule by mouth 2 (two) times daily.    Yes [provider]      Medications: . acetaminophen  650 mg Oral Q6H  . amiodarone  200 mg Oral Daily  . apixaban  5 mg Oral BID  . Chlorhexidine Gluconate Cloth  6 each Topical Daily  . diltiazem  300 mg Oral Daily  . escitalopram  20 mg Oral Daily  . mupirocin  ointment  1 application Nasal BID   . sodium chloride 0.45 % 1,000 mL with potassium chloride 10 mEq infusion 50 mL/hr at 08/07/18 1125   Anti-infectives (From admission, onward)   Start     Dose/Rate Route Frequency Ordered Stop   08/07/18 1330  cefoTEtan (CEFOTAN) 2 g in sodium chloride 0.9 % 100 mL IVPB  Status:  Discontinued     2 g 200 mL/hr over 30 Minutes Intravenous To Surgery 08/07/18 1023 08/07/18 1625   08/07/18 1245  cefoTEtan in Dextrose 5% (CEFOTAN) 2-2.08 GM-%(50ML) IVPB    Note to Pharmacy:  Therese Sarah   : cabinet override      08/07/18 1245 08/08/18 0059      Assessment/Plan A. fib on Eliquis, amiodarone, Cardizem Hx CHF Hx hypertension -hydralazine as needed AKI -CR 1.15 -IV fluids  Incarcerated right femoral hernia chronically incarcerated inspissated fat in the defect which  was reduced S/p laparoscopic repair of incarcerated right femoral hernia(transabdominal preperitoneal, ultra Pro mesh, Vicryl tacks) 08/07/2018 Dr. Jens Som POD #1  FEN: Half-normal saline at 50 ml/hr ID: Preop cefotetan DVT: SCDs/ ?restart Eliquis   Plan: we are going to restart her Eliquis tonight and her Lexapro today.  PT is working with her and recommended home health PT so we will order that.  We have contacted Dr. Tana Coast office and will remove her nose sutures today.      LOS: 1 day    Andyn Sales 08/08/2018 [phone]

## 2018-08-08 NOTE — Evaluation (Addendum)
Physical Therapy Evaluation Patient Details Name: Stephanie Ritter MRN: [id number] DOB: 08/16/1926 Today's Date: 08/08/2018   History of Present Illness  Patient is an 85 y/o female presenting to the ED on 08/06/18 with primary complaints of abdominal pain. S/p laparoscopic repair of incarcerated right femoral hernia on 08/07/18. PMH significant for afib on eliquis, HTN, CHF.    Clinical Impression  Ms. Highley is a very pleasant 85 y/o female admitted with the above listed diagnosis. Patient reports Mod I with mobility with SPC prior to admission. Patient today requiring use of RW and min guard for general safety. Family present and supportive. Will recommend HHPT at discharge to ensure safe and independent functional mobility in the home. PT to follow acutely.      Follow Up Recommendations Home health PT;Supervision - Intermittent    Equipment Recommendations  None recommended by PT    Recommendations for Other Services       Precautions / Restrictions Precautions Precautions: Fall Restrictions Weight Bearing Restrictions: No      Mobility  Bed Mobility               General bed mobility comments: up in recliner  Transfers Overall transfer level: Needs assistance Equipment used: Rolling walker (2 wheeled) Transfers: Sit to/from Stand Sit to Stand: Min guard         General transfer comment: min guard for safety; no LOB  Ambulation/Gait Ambulation/Gait assistance: Min guard Gait Distance (Feet): 150 Feet Assistive device: Rolling walker (2 wheeled) Gait Pattern/deviations: Step-through pattern;Decreased stride length;Drifts right/left Gait velocity: decreased   General Gait Details: mild unsteadiness; no LOB; discussed using RW in the home upon discharge with patient and daughter  Financial trader Rankin (Stroke Patients Only)       Balance Overall balance assessment: Mild deficits observed, not formally tested                                            Pertinent Vitals/Pain Pain Assessment: 0-10 Pain Score: 3  Pain Location: R groin Pain Descriptors / Indicators: Aching;Discomfort;Guarding Pain Intervention(s): Limited activity within patient's tolerance;Monitored during session;Repositioned    Home Living Family/patient expects to be discharged to:: Private residence Living Arrangements: Children Available Help at Discharge: Family;Available 24 hours/day Type of Home: House Home Access: Stairs to enter Entrance Stairs-Rails: Right Entrance Stairs-Number of Steps: 4 Home Layout: One level Home Equipment: Walker - 2 wheels;Cane - single point;Shower seat;Grab bars - toilet;Grab bars - tub/shower      Prior Function Level of Independence: Independent with assistive device(s)         Comments: use of SPC      Hand Dominance        Extremity/Trunk Assessment   Upper Extremity Assessment Upper Extremity Assessment: Defer to OT evaluation    Lower Extremity Assessment Lower Extremity Assessment: Generalized weakness    Cervical / Trunk Assessment Cervical / Trunk Assessment: Normal  Communication   Communication: No difficulties  Cognition Arousal/Alertness: Awake/alert Behavior During Therapy: WFL for tasks assessed/performed Overall Cognitive Status: Within Functional Limits for tasks assessed  General Comments General comments (skin integrity, edema, etc.): family present and supportive    Exercises     Assessment/Plan    PT Assessment Patient needs continued PT services  PT Problem List Decreased strength;Decreased activity tolerance;Decreased balance;Decreased mobility;Decreased knowledge of use of DME;Decreased safety awareness       PT Treatment Interventions DME instruction;Gait training;Stair training;Functional mobility training;Therapeutic activities;Therapeutic exercise;Balance  training;Patient/family education    PT Goals (Current goals can be found in the Care Plan section)  Acute Rehab PT Goals Patient Stated Goal: return home soon PT Goal Formulation: With patient Time For Goal Achievement: 08/22/18 Potential to Achieve Goals: Good    Frequency Min 3X/week   Barriers to discharge        Co-evaluation               AM-PAC PT "6 Clicks" Mobility  Outcome Measure Help needed turning from your back to your side while in a flat bed without using bedrails?: A Little Help needed moving from lying on your back to sitting on the side of a flat bed without using bedrails?: A Little Help needed moving to and from a bed to a chair (including a wheelchair)?: A Little Help needed standing up from a chair using your arms (e.g., wheelchair or bedside chair)?: A Little Help needed to walk in hospital room?: A Little Help needed climbing 3-5 steps with a railing? : A Little 6 Click Score: 18    End of Session Equipment Utilized During Treatment: Gait belt Activity Tolerance: Patient tolerated treatment well Patient left: in chair;with call bell/phone within reach;with family/visitor present Nurse Communication: Mobility status PT Visit Diagnosis: Unsteadiness on feet (R26.81);Other abnormalities of gait and mobility (R26.89);Muscle weakness (generalized) (M62.81)    Time: 6734-1937 PT Time Calculation (min) (ACUTE ONLY): 17 min   Charges:   PT Evaluation $PT Eval Moderate Complexity: 1 Mod          Lanney Gins, PT, DPT Supplemental Physical Therapist 08/08/18 10:17 AM Pager: [phone] Office: [phone]

## 2018-08-08 NOTE — Plan of Care (Signed)

## 2018-08-08 NOTE — Evaluation (Signed)
Occupational Therapy Evaluation Patient Details Name: Stephanie Ritter MRN: [id number] DOB: [birth year] Today's Date: 08/08/2018    History of Present Illness Patient is an 85 y/o female presenting to the ED on 08/06/18 with primary complaints of abdominal pain. S/p laparoscopic repair of incarcerated right femoral hernia on 08/07/18. PMH significant for afib on eliquis, HTN, CHF.   Clinical Impression   Pt is an 85 yo female s/p above dx. PTA: Pt Mod I with ADL and IADLs. Pt performing mobility with SPC and living with daughter at home. Currently, Pt performing set-upA UB ADL and modA for LB ADL due to pain at this time. Pt transfers with minguardA and ADL functional mobility with RW and +1 assist for IV pole. Pt tolerating session well, but limited by inability to bend due to discomfort. Ambulating 200' with RW, SPO2 sats  >93% on RA; HR 60-79 BPM post exertion. Pt would benefit from continued OT skilled services for ADL, mobility and safety. Recommend: HHOT. Thank you for this referral.    Follow Up Recommendations  Home health OT;Supervision - Intermittent    Equipment Recommendations  Tub/shower seat(only is pt feels deconditioned and wants to use it)    Recommendations for Other Services PT consult(ordered)     Precautions / Restrictions Precautions Precautions: Fall Restrictions Weight Bearing Restrictions: No      Mobility Bed Mobility               General bed mobility comments: up in recliner  Transfers Overall transfer level: Needs assistance Equipment used: Rolling walker (2 wheeled) Transfers: Sit to/from Stand Sit to Stand: Min guard         General transfer comment: min guard for safety    Balance Overall balance assessment: Mild deficits observed, not formally tested                                         ADL either performed or assessed with clinical judgement   ADL Overall ADL's : Needs assistance/impaired Eating/Feeding: Set  up;Sitting   Grooming: Wash/dry hands;Wash/dry face;Oral care;Brushing hair;Supervision/safety;Standing   Upper Body Bathing: Set up;Cueing for sequencing;Cueing for safety;Sitting   Lower Body Bathing: Moderate assistance;Cueing for safety;Cueing for sequencing;Sitting/lateral leans;Sit to/from stand   Upper Body Dressing : Set up;Sitting   Lower Body Dressing: Moderate assistance;Cueing for safety;Sitting/lateral leans;Sit to/from stand   Toilet Transfer: Min guard;Comfort height toilet;Grab bars   Toileting- Water quality scientist and Hygiene: Min guard;Sitting/lateral lean;Sit to/from stand       Functional mobility during ADLs: Min Naval architect ADL Comments: setupA for UB ADL; ModA for LB ADL due to pain     Vision Baseline Vision/History: Wears glasses Wears Glasses: Reading only Patient Visual Report: No change from baseline Vision Assessment?: No apparent visual deficits     Perception     Praxis      Pertinent Vitals/Pain Pain Assessment: No/denies pain Pain Score: 3  Pain Location: R groin Pain Descriptors / Indicators: Aching;Discomfort;Guarding Pain Intervention(s): Monitored during session;Repositioned     Hand Dominance     Extremity/Trunk Assessment Upper Extremity Assessment Upper Extremity Assessment: Generalized weakness   Lower Extremity Assessment Lower Extremity Assessment: Generalized weakness   Cervical / Trunk Assessment Cervical / Trunk Assessment: Normal   Communication Communication Communication: No difficulties   Cognition Arousal/Alertness: Awake/alert Behavior During Therapy: WFL for tasks assessed/performed Overall Cognitive Status: Within Functional Limits for tasks assessed  General Comments  SpO2 sats  >93% on RA; HR 60-75 BPM    Exercises     Shoulder Instructions      Home Living Family/patient expects to be discharged to:: Private  residence Living Arrangements: Children Available Help at Discharge: Family;Available 24 hours/day Type of Home: House Home Access: Stairs to enter [employer] of Steps: 4 Entrance Stairs-Rails: Right Home Layout: One level     Bathroom Shower/Tub: Occupational psychologist: Handicapped height Bathroom Accessibility: Yes   Home Equipment: Environmental consultant - 2 wheels;Cane - single point;Grab bars - toilet;Grab bars - tub/shower          Prior Functioning/Environment Level of Independence: Independent with assistive device(s)        Comments: use of SPC         OT Problem List: Decreased strength;Decreased activity tolerance;Impaired balance (sitting and/or standing);Decreased coordination;Decreased safety awareness;Pain      OT Treatment/Interventions: Self-care/ADL training;Therapeutic exercise;Energy conservation;Neuromuscular education;Patient/family education;Balance training    OT Goals(Current goals can be found in the care plan section) Acute Rehab OT Goals Patient Stated Goal: return home soon OT Goal Formulation: With patient Time For Goal Achievement: 08/22/18 Potential to Achieve Goals: Fair ADL Goals Pt Will Perform Lower Body Bathing: with min assist;with adaptive equipment Pt Will Perform Lower Body Dressing: with set-up;with adaptive equipment Pt Will Perform Toileting - Clothing Manipulation and hygiene: with set-up;with adaptive equipment  OT Frequency: Min 2X/week   Barriers to D/C:            Co-evaluation              AM-PAC OT "6 Clicks" Daily Activity     Outcome Measure Help from another person eating meals?: None Help from another person taking care of personal grooming?: None Help from another person toileting, which includes using toliet, bedpan, or urinal?: A Little Help from another person bathing (including washing, rinsing, drying)?: A Lot Help from another person to put on and taking off regular upper body  clothing?: A Little Help from another person to put on and taking off regular lower body clothing?: A Lot 6 Click Score: 18   End of Session Equipment Utilized During Treatment: Gait belt;Rolling walker Nurse Communication: Mobility status  Activity Tolerance: Patient tolerated treatment well Patient left: in chair;with call bell/phone within reach;with family/visitor present  OT Visit Diagnosis: Unsteadiness on feet (R26.81);Muscle weakness (generalized) (M62.81)                Time: 5366-4403 OT Time Calculation (min): 33 min Charges:  OT General Charges $OT Visit: 1 Visit OT Evaluation $OT Eval Moderate Complexity: 1 Mod OT Treatments $Self Care/Home Management : 8-22 mins  Ebony Hail Harold Hedge) Marsa Aris OTR/L Acute Rehabilitation Services Pager: [phone] Office: [phone]   Fredda Hammed 08/08/2018, 2:06 PM

## 2018-08-08 NOTE — Care Management Note (Signed)
Case Management Note  Patient Details  Name: Gulianna Hornsby MRN: [id number] Date of Birth: 24-May-1927  Subjective/Objective:                    Action/Plan:  Confirmed face sheet information with patient. Patient lives with daughter Expected Discharge Date:                  Expected Discharge Plan:  [place]  In-House Referral:  NA  Discharge planning Services  CM Consult  Post Acute Care Choice:  Home Health Choice offered to:  Patient  DME Arranged:  N/A DME Agency:  NA  HH Arranged:  PT [place] Agency:  [facility]  Status of Service:  Completed, signed off  If discussed at [place] of Stay Meetings, dates discussed:    Additional Comments:  Marilu Favre, RN 08/08/2018, 11:05 AM

## 2018-08-09 MED ORDER — ACETAMINOPHEN 325 MG PO TABS: 650.0000 mg | ORAL_TABLET | Freq: Four times a day (QID) | ORAL | Status: DC

## 2018-08-09 MED ORDER — DOCUSATE SODIUM 100 MG PO CAPS: ORAL_CAPSULE | ORAL | 0 refills | Status: DC

## 2018-08-09 MED ORDER — FUROSEMIDE 40 MG PO TABS: ORAL_TABLET | ORAL | Status: DC

## 2018-08-09 NOTE — Progress Notes (Signed)
2 Days Post-Op    CC: Incarcerated right femoral hernia -reduced  Subjective: Patient is lying in bed, seems like she did not sleep well last evening.  She says she is a little fuzzy this a.m.  She says she ate better yesterday, and we saw her up walking, up in the chair a good deal yesterday also.  She has been seen by OT and PT and these are being set up for home health also.  Her port sites look good she is tolerating diet she has not had a bowel movement so far.  Objective: Vital signs in last 24 hours: Temp:  [97.7 F (36.5 C)-97.9 F (36.6 C)] 97.8 F (36.6 C) (01/10 0543) Pulse Rate:  [61-67] 65 (01/10 0543) Resp:  [16-26] 22 (01/10 0543) BP: (104-138)/(47-69) 138/59 (01/10 0543) SpO2:  [95 %-97 %] 95 % (01/10 0543) Last BM Date: 08/05/18 1000 PO 866 IV Urine 1450 Afebrile, HR 60's  BP OK Creatinine is stable Intake/Output from previous day: 01/09 0701 - 01/10 0700 In: 1866.4 [P.O.:1000; I.V.:866.4] Out: 1450 [Urine:1450] Intake/Output this shift: Total I/O In: -  Out: 300 [Urine:300]  General appearance: alert, cooperative, no distress and Says she feels a little fuzzy this a.m. Resp: clear to auscultation bilaterally GI: Soft, sore, sites all look good.  She is tolerating a diet, positive bowel sounds, no BM so far.  Lab Results:  Recent Labs    08/07/18 0234 08/08/18 0406  WBC 7.8 9.0  HGB 13.4 13.5  HCT 41.1 42.5  PLT 209 200    BMET Recent Labs    08/06/18 1807 08/08/18 0406  NA 137 136  K 4.3 4.6  CL 102 101  CO2 26 25  GLUCOSE 113* 135*  BUN 27* 18  CREATININE 1.15* 1.12*  CALCIUM 9.1 8.4*   PT/INR No results for input(s): LABPROT, INR in the last 72 hours.  Recent Labs  Lab 08/06/18 1807  AST 18  ALT 13  ALKPHOS 66  BILITOT 0.9  PROT 6.7  ALBUMIN 3.5     Lipase     Component Value Date/Time   LIPASE 18 08/06/2018 1807     Medications: . acetaminophen  650 mg Oral Q6H  . amiodarone  200 mg Oral Daily  . apixaban  5 mg  Oral BID  . Chlorhexidine Gluconate Cloth  6 each Topical Daily  . diltiazem  300 mg Oral Daily  . docusate sodium  100 mg Oral BID  . escitalopram  20 mg Oral Daily  . mupirocin ointment  1 application Nasal BID    Assessment/Plan A. fib on Eliquis, amiodarone, Cardizem Hx CHF Hx hypertension -hydralazine as needed AKI -CR 1.15 -IV fluids  Incarcerated right femoral hernia chronically incarcerated inspissated fat in the defect which was reduced S/p laparoscopic repair of incarcerated right femoral hernia(transabdominal preperitoneal, ultra Pro mesh, Vicryl tacks) 08/07/2018 Dr. Jens Som POD #1  FEN: Half-normal saline at 50 ml/hr ID: Preop cefotetan DVT: SCDs/  Eliquis   Plan: She is back on all her preadmission medicines as before.  She appears quite stable and ready for discharge.  I will get her ready for discharge but wait until her daughter is here to discuss.  I have personally reviewed the patients medication history on the [place] controlled substance database.  She is only been using Tylenol for pain.  LOS: 2 days    Yeva Bissette 08/09/2018 [phone]

## 2018-08-09 NOTE — Progress Notes (Signed)
Physical Therapy Treatment Patient Details Name: Stephanie Ritter MRN: [id number] DOB: 1926-12-11 Today's Date: 08/09/2018    History of Present Illness Patient is an 85 y/o female presenting to the ED on 08/06/18 with primary complaints of abdominal pain. S/p laparoscopic repair of incarcerated right femoral hernia on 08/07/18. PMH significant for afib on eliquis, HTN, CHF.    PT Comments    Patient seen for mobility progression and stair training. Pt tolerated mobility well and overall requires min guard assist for safety due to unsteadiness with gait. Daughter present and supportive. Current plan remains appropriate.    Follow Up Recommendations  Home health PT;Supervision - Intermittent     Equipment Recommendations  None recommended by PT    Recommendations for Other Services       Precautions / Restrictions Precautions Precautions: Fall    Mobility  Bed Mobility               General bed mobility comments: pt up in bathroom with daughter  Transfers Overall transfer level: Needs assistance Equipment used: Rolling walker (2 wheeled) Transfers: Sit to/from Stand Sit to Stand: Min guard         General transfer comment: min guard for safety  Ambulation/Gait Ambulation/Gait assistance: Min guard Gait Distance (Feet): 200 Feet Assistive device: Rolling walker (2 wheeled) Gait Pattern/deviations: Step-through pattern;Decreased stride length;Drifts right/left Gait velocity: decreased   General Gait Details: cues for safe use of RW at times; continues to be mildly unsteady but no LOB   Stairs Stairs: Yes Stairs assistance: Min guard Stair Management: One rail Right;Step to pattern;Sideways Number of Stairs: 4 General stair comments: cues for sequencing/technique   Wheelchair Mobility    Modified Rankin (Stroke Patients Only)       Balance Overall balance assessment: Mild deficits observed, not formally tested                                           Cognition Arousal/Alertness: Awake/alert Behavior During Therapy: WFL for tasks assessed/performed Overall Cognitive Status: Within Functional Limits for tasks assessed                                        Exercises      General Comments        Pertinent Vitals/Pain Pain Assessment: Faces Faces Pain Scale: Hurts a little bit Pain Location: R groin Pain Descriptors / Indicators: Discomfort;Guarding Pain Intervention(s): Monitored during session    Home Living                      Prior Function            PT Goals (current goals can now be found in the care plan section) Progress towards PT goals: Progressing toward goals    Frequency    Min 3X/week      PT Plan Current plan remains appropriate    Co-evaluation              AM-PAC PT "6 Clicks" Mobility   Outcome Measure  Help needed turning from your back to your side while in a flat bed without using bedrails?: A Little Help needed moving from lying on your back to sitting on the side of a flat bed without using bedrails?: A Little Help  needed moving to and from a bed to a chair (including a wheelchair)?: A Little Help needed standing up from a chair using your arms (e.g., wheelchair or bedside chair)?: A Little Help needed to walk in hospital room?: A Little Help needed climbing 3-5 steps with a railing? : A Little 6 Click Score: 18    End of Session Equipment Utilized During Treatment: Gait belt Activity Tolerance: Patient tolerated treatment well Patient left: in chair;with call bell/phone within reach;with family/visitor present Nurse Communication: Mobility status PT Visit Diagnosis: Unsteadiness on feet (R26.81);Other abnormalities of gait and mobility (R26.89);Muscle weakness (generalized) (M62.81)     Time: 9774-1423 PT Time Calculation (min) (ACUTE ONLY): 18 min  Charges:  $Gait Training: 8-22 mins                     Earney Navy,  PTA Acute Rehabilitation Services Pager: [phone] Office: [phone]     Darliss Cheney 08/09/2018, 1:35 PM

## 2018-08-09 NOTE — Discharge Summary (Signed)
Physician Discharge Summary  Patient ID: Stephanie Ritter MRN: [id number] DOB/AGE: May 05, 1927 85 y.o.  Admit date: 08/06/2018 Discharge date: 08/09/2018  Admission Diagnoses:  Incarcerated right femoral hernia -reduced in ED A. fib on Eliquis Hx CHF Hx hypertension AKI-CR 1.15>> 1.12  Discharge Diagnoses:  Same  Active Problems:   Femoral hernia   PROCEDURES: laparoscopic repair of incarcerated right femoral hernia(transabdominal preperitoneal, ultra Pro mesh, Vicryl tacks)08/07/2018 Dr. Drucilla Chalet Course: Pt is an 57F with a h/o afib on eliquis, HTN, CHF, who comes in with abdominal pain since 0500.  Pt states she's had con't abdominal pain since that time. She had one bout of nausea and emesis this AM at 0500 and has had some bloating since then.  Pt underwent CT in ED which I reviewed personally which a RIH, likely femoral containing SB and transition point.    Pt's daughter states she last took her Eliquis Monday AM.  She was seen in the emergency department by Dr. Rosendo Gros and admitted.  She was evaluated and taken the operating room the following a.m. by Dr. Jens Som.  She underwent the above-noted procedure.  Postop she did well.  He was placed back on all of her preadmission medicines over the next 24 hours, except Lasix.  Her creatinine was slightly elevated at 1.15.  It went down to 1.12 after 48 hours of gentle IV hydration.  We have not restarted her Lasix and asked her to call Dr. Felipa Eth about restarting this.  Postop her pain is been controlled with plain Tylenol she has not received any narcotics.  Tolerating a soft diet.  She has been seen by OT and PT and home health is arranged for both services at home.  We plan discharge today.  I will tell her daughter to record her blood pressures at home.  And call those to Dr. Felipa Eth on Monday 08/12/18, to discuss resuming her Lasix.    CBC Latest Ref Rng & Units 08/08/2018 08/07/2018 08/06/2018  WBC 4.0 -  10.5 K/uL 9.0 7.8 7.9  Hemoglobin 12.0 - 15.0 g/dL 13.5 13.4 14.7  Hematocrit 36.0 - 46.0 % 42.5 41.1 47.2(H)  Platelets 150 - 400 K/uL 200 209 199   CMP Latest Ref Rng & Units 08/08/2018 08/06/2018 02/19/2018  Glucose 70 - 99 mg/dL 135(H) 113(H) 132(H)  BUN 8 - 23 mg/dL 18 27(H) 25(H)  Creatinine 0.44 - 1.00 mg/dL 1.12(H) 1.15(H) 1.02(H)  Sodium 135 - 145 mmol/L 136 137 141  Potassium 3.5 - 5.1 mmol/L 4.6 4.3 4.2  Chloride 98 - 111 mmol/L 101 102 106  CO2 22 - 32 mmol/L 25 26 29   Calcium 8.9 - 10.3 mg/dL 8.4(L) 9.1 8.8(L)  Total Protein 6.5 - 8.1 g/dL - 6.7 6.5  Total Bilirubin 0.3 - 1.2 mg/dL - 0.9 0.7  Alkaline Phos 38 - 126 U/L - 66 89  AST 15 - 41 U/L - 18 22  ALT 0 - 44 U/L - 13 14    Admit CT/7/20:Small bowel obstruction with transition point in a right inguinal hernia. 2. Advanced diffuse colonic diverticulosis without diverticulitis. 3. Layering hyperdensity in the gallbladder consistent with stones/sludge. 4. Prominent adnexal vascularity and dilatation of the left ovarian vein, can be seen with pelvic congestion syndrome. 5.  Aortic Atherosclerosis    Disposition:    Allergies as of 08/09/2018      Reactions   Asa [aspirin] Other (See Comments)   Excessive bleeding   Nsaids Other (See Comments)   Bleeding, ulcers  Medication List    TAKE these medications   acetaminophen 325 MG tablet Commonly known as:  TYLENOL Take 2 tablets (650 mg total) by mouth every 6 (six) hours. What changed:    medication strength  how much to take  when to take this  reasons to take this   amiodarone 200 MG tablet Commonly known as:  PACERONE TAKE 1 TABLET BY MOUTH  DAILY   diltiazem 300 MG 24 hr capsule Commonly known as:  CARDIZEM CD Take 1 capsule (300 mg total) by mouth daily.   docusate sodium 100 MG capsule Commonly known as:  COLACE If you are having trouble with constipation you can use this, follow the package directions.  Your aim is to have 1-2 soft  bowel movements per day.  He can buy this over-the-counter at any drugstore.   ELIQUIS 5 MG Tabs tablet Generic drug:  apixaban Take 5 mg by mouth 2 (two) times daily.   escitalopram 20 MG tablet Commonly known as:  LEXAPRO Take 20 mg by mouth daily.   EYE DROPS OP Place 2 drops into both eyes as needed (for dry eyes).   furosemide 40 MG tablet Commonly known as:  LASIX Plan to resume next week.  CAll your PCP and discuss resuming this. What changed:    how much to take  how to take this  when to take this  additional instructions   PRESERVISION/LUTEIN Caps Take 1 capsule by mouth 2 (two) times daily.      Follow-up Information    Clovis Riley, MD Follow up on 08/21/2018.   Specialty:  General Surgery Why:  Your appointment is at 1:50 AM.  Be at the office 30 minutes early for check in.  Bring photo ID and insurance information with you.   Contact information: [address] [place] [address] [phone]        Lajean Manes, MD Follow up.   Specialty:  Internal Medicine Why:  Call and let them know you had surgery and let them follow up on your medicines and Medical issues.  check your blood pressures at home and let Dr. Felipa Eth decide on restarting lasix. Contact information: [address] [place] [address] [phone]        [facility] - Dme Follow up.   Contact information: [address] [place] [address] [phone]           Signed: Earnstine Regal 08/13/2018, 12:47 PM

## 2018-08-09 NOTE — Progress Notes (Signed)
Burnell Blanks to be D/C'd  per MD order. Discussed with the patient and all questions fully answered.  VSS, Skin clean, dry and intact without evidence of skin break down, no evidence of skin tears noted.  IV catheter discontinued intact. Site without signs and symptoms of complications. Dressing and pressure applied.  An After Visit Summary was printed and given to the patient. Patient received prescription.  D/c education completed with patient/family including follow up instructions, medication list, d/c activities limitations if indicated, with other d/c instructions as indicated by MD - patient able to verbalize understanding, all questions fully answered.   Patient instructed to return to ED, call 911, or call MD for any changes in condition.   Patient to be escorted via [place], and D/C home via private auto.

## 2018-08-09 NOTE — Care Management Important Message (Signed)
Important Message  Patient Details  Name: Stephanie Ritter MRN: [id number] Date of Birth: January 28, 1927   Medicare Important Message Given:  Yes    [name] [name] 08/09/2018, 3:48 PM

## 2018-08-09 NOTE — Progress Notes (Signed)
PT Cancellation Note  Patient Details Name: Stephanie Ritter MRN: [id number] DOB: Oct 13, 1926   Cancelled Treatment:    Reason Eval/Treat Not Completed: Other (comment)(RN requests hold for a while this am so pt can get sleep.) Patient needs to practice stairs before d/c. PT will continue to follow acutely.   Salina April, PTA Acute Rehabilitation Services Pager: [phone] Office: [phone]   08/09/2018, 9:03 AM

## 2018-08-12 DIAGNOSIS — Z48815 Encounter for surgical aftercare following surgery on the digestive system: Secondary | ICD-10-CM | POA: Diagnosis not present

## 2018-08-12 DIAGNOSIS — Z7901 Long term (current) use of anticoagulants: Secondary | ICD-10-CM | POA: Diagnosis not present

## 2018-08-14 DIAGNOSIS — I480 Paroxysmal atrial fibrillation: Secondary | ICD-10-CM | POA: Diagnosis not present

## 2018-08-14 DIAGNOSIS — Z23 Encounter for immunization: Secondary | ICD-10-CM | POA: Diagnosis not present

## 2018-08-14 DIAGNOSIS — I129 Hypertensive chronic kidney disease with stage 1 through stage 4 chronic kidney disease, or unspecified chronic kidney disease: Secondary | ICD-10-CM | POA: Diagnosis not present

## 2018-08-14 DIAGNOSIS — K5901 Slow transit constipation: Secondary | ICD-10-CM | POA: Diagnosis not present

## 2018-08-14 DIAGNOSIS — N183 Chronic kidney disease, stage 3 (moderate): Secondary | ICD-10-CM | POA: Diagnosis not present

## 2018-08-14 DIAGNOSIS — J00 Acute nasopharyngitis [common cold]: Secondary | ICD-10-CM | POA: Diagnosis not present

## 2018-08-16 DIAGNOSIS — Z48815 Encounter for surgical aftercare following surgery on the digestive system: Secondary | ICD-10-CM | POA: Diagnosis not present

## 2018-08-16 DIAGNOSIS — Z7901 Long term (current) use of anticoagulants: Secondary | ICD-10-CM | POA: Diagnosis not present

## 2018-08-19 DIAGNOSIS — Z7901 Long term (current) use of anticoagulants: Secondary | ICD-10-CM | POA: Diagnosis not present

## 2018-08-19 DIAGNOSIS — Z48815 Encounter for surgical aftercare following surgery on the digestive system: Secondary | ICD-10-CM | POA: Diagnosis not present

## 2018-08-21 ENCOUNTER — Ambulatory Visit (HOSPITAL_COMMUNITY): Payer: Medicare Other | Admitting: Nurse Practitioner

## 2018-08-23 DIAGNOSIS — Z48815 Encounter for surgical aftercare following surgery on the digestive system: Secondary | ICD-10-CM | POA: Diagnosis not present

## 2018-08-23 DIAGNOSIS — Z7901 Long term (current) use of anticoagulants: Secondary | ICD-10-CM | POA: Diagnosis not present

## 2018-08-26 ENCOUNTER — Ambulatory Visit (HOSPITAL_COMMUNITY): Payer: Medicare Other | Admitting: Nurse Practitioner

## 2018-08-27 DIAGNOSIS — Z7901 Long term (current) use of anticoagulants: Secondary | ICD-10-CM | POA: Diagnosis not present

## 2018-08-27 DIAGNOSIS — Z48815 Encounter for surgical aftercare following surgery on the digestive system: Secondary | ICD-10-CM | POA: Diagnosis not present

## 2018-08-29 ENCOUNTER — Encounter (HOSPITAL_COMMUNITY): Payer: Self-pay | Admitting: Nurse Practitioner

## 2018-08-29 ENCOUNTER — Ambulatory Visit (HOSPITAL_COMMUNITY)
Admission: RE | Admit: 2018-08-29 | Discharge: 2018-08-29 | Disposition: A | Discharge status: Home / Self Care | Payer: Medicare Other | Source: Ambulatory Visit | Attending: Nurse Practitioner | Admitting: Nurse Practitioner

## 2018-08-29 VITALS — BP 126/64 | HR 58 | Ht 64.0 in | Wt 153.6 lb

## 2018-08-29 DIAGNOSIS — I480 Paroxysmal atrial fibrillation: Secondary | ICD-10-CM | POA: Diagnosis not present

## 2018-08-29 DIAGNOSIS — I4819 Other persistent atrial fibrillation: Secondary | ICD-10-CM | POA: Diagnosis not present

## 2018-08-29 DIAGNOSIS — I4510 Unspecified right bundle-branch block: Secondary | ICD-10-CM | POA: Diagnosis not present

## 2018-08-29 DIAGNOSIS — Z7901 Long term (current) use of anticoagulants: Secondary | ICD-10-CM | POA: Insufficient documentation

## 2018-08-29 DIAGNOSIS — Z96643 Presence of artificial hip joint, bilateral: Secondary | ICD-10-CM | POA: Insufficient documentation

## 2018-08-29 DIAGNOSIS — I509 Heart failure, unspecified: Secondary | ICD-10-CM | POA: Insufficient documentation

## 2018-08-29 DIAGNOSIS — Z79899 Other long term (current) drug therapy: Secondary | ICD-10-CM | POA: Diagnosis not present

## 2018-08-29 DIAGNOSIS — R001 Bradycardia, unspecified: Secondary | ICD-10-CM | POA: Diagnosis not present

## 2018-08-29 DIAGNOSIS — I4892 Unspecified atrial flutter: Secondary | ICD-10-CM | POA: Insufficient documentation

## 2018-08-29 DIAGNOSIS — Z886 Allergy status to analgesic agent status: Secondary | ICD-10-CM | POA: Diagnosis not present

## 2018-08-29 DIAGNOSIS — Z87891 Personal history of nicotine dependence: Secondary | ICD-10-CM | POA: Insufficient documentation

## 2018-08-29 DIAGNOSIS — Z888 Allergy status to other drugs, medicaments and biological substances status: Secondary | ICD-10-CM | POA: Diagnosis not present

## 2018-08-29 DIAGNOSIS — R9431 Abnormal electrocardiogram [ECG] [EKG]: Secondary | ICD-10-CM | POA: Diagnosis not present

## 2018-08-29 DIAGNOSIS — I110 Hypertensive heart disease with heart failure: Secondary | ICD-10-CM | POA: Diagnosis not present

## 2018-08-29 MED ORDER — AMIODARONE HCL 200 MG PO TABS: 100.0000 mg | ORAL_TABLET | Freq: Every day | ORAL | 1 refills | Status: DC

## 2018-08-29 NOTE — Patient Instructions (Signed)
Decrease amiodarone to 100mg once a day  

## 2018-08-29 NOTE — Progress Notes (Signed)
Primary Care Physician: Lajean Manes, MD Referring Physician: Dr. Felipa Eth EP: Dr. Dahlia Bailiff Coll is a 85 y.o. female with a h/o paroxysmal afib with conversion to SR with amiodarone, which did not require a cardioversion. She is s/p hernia repair 08/07/18 and has recovered nicely. She did note one episode of bradycardia associated with some lightheadedness after her surgery. Denies syncope or falls.  Today, she denies symptoms of palpitations, chest pain, shortness of breath, orthopnea, PND, lower extremity edema, presyncope, syncope, or neurologic sequela.  The patient is tolerating medications without difficulties and is otherwise without complaint today.   Past Medical History:  Diagnosis Date  . CHF (congestive heart failure) ([facility])   . Hypertension   . Persistent atrial fibrillation   . Tinnitus    Past Surgical History:  Procedure Laterality Date  . APPENDECTOMY     60 years ago  . ATRIAL FIBRILLATION ABLATION N/A    8 years ago  . BOWEL RESECTION N/A 08/07/2018   Procedure: SMALL BOWEL RESECTION;  Surgeon: Clovis Riley, MD;  Location: [facility];  Service: General;  Laterality: N/A;  . hip replacement Bilateral    4 and 8 years ago  . INGUINAL HERNIA REPAIR Right 08/07/2018   Procedure: Laparoscopic right femoral hernia repair with mesh, possible open, possible bowel resection;  Surgeon: Clovis Riley, MD;  Location: [facility];  Service: General;  Laterality: Right;    Current Outpatient Medications  Medication Sig Dispense Refill  . acetaminophen (TYLENOL) 325 MG tablet Take 2 tablets (650 mg total) by mouth every 6 (six) hours.    Marland Kitchen amiodarone (PACERONE) 200 MG tablet Take 0.5 tablets (100 mg total) by mouth daily. 90 tablet 1  . apixaban (ELIQUIS) 5 MG TABS tablet Take 5 mg by mouth 2 (two) times daily.     . Carboxymethylcellulose Sodium (EYE DROPS OP) Place 2 drops into both eyes as needed (for dry eyes).     Marland Kitchen diltiazem (CARDIZEM CD) 300 MG 24 hr capsule  Take 1 capsule (300 mg total) by mouth daily.    Marland Kitchen escitalopram (LEXAPRO) 20 MG tablet Take 20 mg by mouth daily.   11  . Multiple Vitamins-Minerals (PRESERVISION/LUTEIN) CAPS Take 1 capsule by mouth 2 (two) times daily.      No current facility-administered medications for this encounter.     Allergies  Allergen Reactions  . Asa [Aspirin] Other (See Comments)    Excessive bleeding  . Nsaids Other (See Comments)    Bleeding, ulcers    Social History   Socioeconomic History  . Marital status: Married    Spouse name: Not on file  . Number of children: Not on file  . Years of education: Not on file  . Highest education level: Not on file  Occupational History  . Not on file  Social Needs  . Financial resource strain: Not on file  . Food insecurity:    Worry: Not on file    Inability: Not on file  . Transportation needs:    Medical: Not on file    Non-medical: Not on file  Tobacco Use  . Smoking status: Former Smoker    Types: Cigarettes  . Smokeless tobacco: Never Used  . Tobacco comment: Pt quit 50+ years ago  Substance and Sexual Activity  . Alcohol use: Yes    Alcohol/week: 1.0 standard drinks    Types: 1 Glasses of wine per week    Comment: occasionally  . Drug use: No  .  Sexual activity: Not on file  Lifestyle  . Physical activity:    Days per week: Not on file    Minutes per session: Not on file  . Stress: Not on file  Relationships  . Social connections:    Talks on phone: Not on file    Gets together: Not on file    Attends religious service: Not on file    Active member of club or organization: Not on file    Attends meetings of clubs or organizations: Not on file    Relationship status: Not on file  . Intimate partner violence:    Fear of current or ex partner: Not on file    Emotionally abused: Not on file    Physically abused: Not on file    Forced sexual activity: Not on file  Other Topics Concern  . Not on file  Social History Narrative  .  Not on file    Family History  Family history unknown: Yes    ROS- All systems are reviewed and negative except as per the HPI above  Physical Exam: Vitals:   08/29/18 1430  BP: 126/64  Pulse: (!) 58  Weight: 69.7 kg  Height: 5\' 4"  (1.626 m)   Wt Readings from Last 3 Encounters:  08/29/18 69.7 kg  08/06/18 65.8 kg  05/22/18 69.4 kg    Labs: Lab Results  Component Value Date   NA 136 08/08/2018   K 4.6 08/08/2018   CL 101 08/08/2018   CO2 25 08/08/2018   GLUCOSE 135 (H) 08/08/2018   BUN 18 08/08/2018   CREATININE 1.12 (H) 08/08/2018   CALCIUM 8.4 (L) 08/08/2018   No results found for: INR No results found for: CHOL, HDL, LDLCALC, TRIG   GEN- The patient is well appearing elderly female, alert and oriented x 3 today.   HEENT-head normocephalic, atraumatic, sclera clear, conjunctiva pink, hearing intact, trachea midline. Lungs- Clear to ausculation bilaterally, normal work of breathing Heart- Regular rate and rhythm, no murmurs, rubs or gallops  GI- soft, NT, ND, + BS Extremities- no clubbing, cyanosis, or edema MS- no significant deformity or atrophy Skin- no rash or lesion Psych- euthymic mood, full affect Neuro- strength and sensation are intact   EKG- SR HR 58, LAD, 1st degree AV blockslow R wave prog, LVH, PR 218, QRS 112, QTc 463.   Assessment and Plan: 1. Persistent Afib/flutter Continues in SR. Did have an episode of bradycardia with some dizziness. No syncope or falls. Will decrease her amiodarone to 100 mg daily. Continue diltiazem 300 mg daily Continue Eliquis 5 mg BID   This patients CHA2DS2-VASc Score and unadjusted Ischemic Stroke Rate (% per year) is equal to 7.2 % stroke rate/year from a score of 5  Above score calculated as 1 point each if present [CHF, HTN, DM, Vascular=MI/PAD/Aortic Plaque, Age if 65-74, or Female] Above score calculated as 2 points each if present [Age > 75, or Stroke/TIA/TE]  2. HTN Stable, no changes today.  F/u  in 2 weeks for ECG  Adline Peals PA-C [facility] [address] [place], [place] [address] [phone]

## 2018-08-30 DIAGNOSIS — Z48815 Encounter for surgical aftercare following surgery on the digestive system: Secondary | ICD-10-CM | POA: Diagnosis not present

## 2018-08-30 DIAGNOSIS — Z7901 Long term (current) use of anticoagulants: Secondary | ICD-10-CM | POA: Diagnosis not present

## 2018-09-12 ENCOUNTER — Encounter (INDEPENDENT_AMBULATORY_CARE_PROVIDER_SITE_OTHER): Payer: Medicare Other | Admitting: Ophthalmology

## 2018-09-16 ENCOUNTER — Ambulatory Visit (HOSPITAL_COMMUNITY)
Admission: RE | Admit: 2018-09-16 | Discharge: 2018-09-16 | Disposition: A | Discharge status: Home / Self Care | Payer: Medicare Other | Source: Ambulatory Visit | Attending: Nurse Practitioner | Admitting: Nurse Practitioner

## 2018-09-16 ENCOUNTER — Encounter (HOSPITAL_COMMUNITY): Payer: Self-pay | Admitting: Nurse Practitioner

## 2018-09-16 DIAGNOSIS — Z79899 Other long term (current) drug therapy: Secondary | ICD-10-CM | POA: Insufficient documentation

## 2018-09-16 DIAGNOSIS — I4891 Unspecified atrial fibrillation: Secondary | ICD-10-CM | POA: Insufficient documentation

## 2018-09-16 NOTE — Progress Notes (Addendum)
Pt in for repeat EKG. To be reviewed by Ceasar Lund  Pt in for repeat EKG after reduction in amiodarone to 100 mg a day. NSR on EKG at 59 bpm, pr int at 230 ms, qrs int 112 bpm, qtc 433 ms. No issues with any afib with reduction in dose. F/u in 6 months.

## 2018-09-23 ENCOUNTER — Telehealth (HOSPITAL_COMMUNITY): Payer: Self-pay | Admitting: *Deleted

## 2018-09-23 NOTE — Telephone Encounter (Signed)
Pts daughter called in stating the last few days her mother has been more fatigued with activity. She is unsure of what has changed but at least the last few days has been different. BP is 91/54 HR 70. Encouraged pt to drink extra water today see if this could be contributing to her symptoms. Daughter will call back with report tomorrow.

## 2018-10-01 ENCOUNTER — Encounter (INDEPENDENT_AMBULATORY_CARE_PROVIDER_SITE_OTHER): Payer: Medicare Other | Admitting: Ophthalmology

## 2018-10-01 DIAGNOSIS — H43813 Vitreous degeneration, bilateral: Secondary | ICD-10-CM

## 2018-10-01 DIAGNOSIS — H353132 Nonexudative age-related macular degeneration, bilateral, intermediate dry stage: Secondary | ICD-10-CM

## 2018-10-01 DIAGNOSIS — I10 Essential (primary) hypertension: Secondary | ICD-10-CM | POA: Diagnosis not present

## 2018-10-01 DIAGNOSIS — H34832 Tributary (branch) retinal vein occlusion, left eye, with macular edema: Secondary | ICD-10-CM

## 2018-10-01 DIAGNOSIS — H35033 Hypertensive retinopathy, bilateral: Secondary | ICD-10-CM

## 2018-10-17 ENCOUNTER — Other Ambulatory Visit (HOSPITAL_COMMUNITY): Payer: Self-pay | Admitting: Nurse Practitioner

## 2018-11-22 ENCOUNTER — Other Ambulatory Visit (HOSPITAL_COMMUNITY): Payer: Self-pay | Admitting: *Deleted

## 2018-11-22 MED ORDER — APIXABAN 5 MG PO TABS: 5.0000 mg | ORAL_TABLET | Freq: Two times a day (BID) | ORAL | 2 refills | Status: DC

## 2018-11-25 ENCOUNTER — Telehealth (HOSPITAL_COMMUNITY): Payer: Self-pay | Admitting: *Deleted

## 2018-11-25 NOTE — Telephone Encounter (Signed)
Discussed below with daughter - denies any falls or weight loss. She would prefer to keep patient on guideline recommended dose. She will notify us if a change in status such as weight loss or increase in falls. Pt will continue on Eliquis 5mg  twice a day.

## 2018-11-25 NOTE — Telephone Encounter (Signed)
-----   Message from Sherran Needs, NP sent at 11/22/2018  3:00 PM EDT ----- Regarding: RE: ?dose of eliquis Unless she has lost weight less than 60 kgs or creatinine has climbed above 1.5, she does not meet guidelines unless she is having some bleeding somewhere or he is being very conservative, ? Falls. ----- Message ----- From: Juluis Mire, RN Sent: 11/22/2018  11:53 AM EDT To: Sherran Needs, NP Subject: ?dose of eliquis                               Her daughter called stating Dr. Felipa Eth told pt she needed to decrease her eliquis to 2.5 -- daughter wanted to confirm this was accurate before doing so. Stephanie Ritter

## 2018-11-27 DIAGNOSIS — R358 Other polyuria: Secondary | ICD-10-CM | POA: Diagnosis not present

## 2018-11-28 ENCOUNTER — Other Ambulatory Visit (HOSPITAL_COMMUNITY): Payer: Self-pay | Admitting: Nurse Practitioner

## 2018-11-28 ENCOUNTER — Telehealth (HOSPITAL_COMMUNITY): Payer: Self-pay | Admitting: *Deleted

## 2018-11-28 MED ORDER — DILTIAZEM HCL ER COATED BEADS 240 MG PO CP24: 240.0000 mg | ORAL_CAPSULE | Freq: Every day | ORAL | 1 refills | Status: DC

## 2018-11-28 NOTE — Telephone Encounter (Signed)
Patient daughter called in stating pt is having weakness -- HR in the 40-50s BP sometimes soft less than 100. Discussed with Roderic Palau NP - will try reducing cardizem to 240mg  a day and call back in 1 week with update of HR/symptoms. Daughter in agreement.

## 2018-12-04 ENCOUNTER — Other Ambulatory Visit (HOSPITAL_COMMUNITY): Payer: Self-pay | Admitting: Nurse Practitioner

## 2018-12-04 ENCOUNTER — Other Ambulatory Visit (HOSPITAL_COMMUNITY): Payer: Self-pay | Admitting: *Deleted

## 2018-12-04 ENCOUNTER — Other Ambulatory Visit: Payer: Self-pay

## 2018-12-04 ENCOUNTER — Telehealth (HOSPITAL_COMMUNITY): Payer: Self-pay | Admitting: *Deleted

## 2018-12-04 ENCOUNTER — Ambulatory Visit (HOSPITAL_COMMUNITY)
Admission: RE | Admit: 2018-12-04 | Discharge: 2018-12-04 | Disposition: A | Discharge status: Home / Self Care | Payer: Medicare Other | Source: Ambulatory Visit | Attending: Nurse Practitioner | Admitting: Nurse Practitioner

## 2018-12-04 DIAGNOSIS — I4819 Other persistent atrial fibrillation: Secondary | ICD-10-CM

## 2018-12-04 MED ORDER — DILTIAZEM HCL ER COATED BEADS 240 MG PO CP24: 240.0000 mg | ORAL_CAPSULE | Freq: Every day | ORAL | 1 refills | Status: DC

## 2018-12-04 NOTE — Telephone Encounter (Signed)
Per Stephanie Ritter with continued weakness, notification of irregular heartbeat on her bp machine despite decreasing diltiazem to 240mg  a day - recommends zio patch for 1 week and increase amiodarone to 200mg  a day. Daughter notified of recommendations and in agreement. Will await results of monitor.

## 2018-12-04 NOTE — Telephone Encounter (Signed)
Patient's daughter called back stating pt continues to feel weak and intermittent shortness of breath. Her weight is up approximately 4lbs from our last documented weight but no swelling noted. Pt daughter states HR is in 80s now BP better in the 120-140/70s. She does note some irregular heart rate notifications on the BP cuff intermittently. She went last week to PCP office thinking UTI due to frequent urination at night but urine was clean. The weakness/inc urination has been going on for about 3 weeks. Instructed pt I would consult with Roderic Palau NP for further recommendations and be back in touch with her.

## 2018-12-31 DIAGNOSIS — N390 Urinary tract infection, site not specified: Secondary | ICD-10-CM | POA: Diagnosis not present

## 2019-01-03 ENCOUNTER — Other Ambulatory Visit: Payer: Self-pay | Admitting: Geriatric Medicine

## 2019-01-03 DIAGNOSIS — M545 Low back pain: Secondary | ICD-10-CM | POA: Diagnosis not present

## 2019-01-03 DIAGNOSIS — R1031 Right lower quadrant pain: Secondary | ICD-10-CM

## 2019-01-03 DIAGNOSIS — Z23 Encounter for immunization: Secondary | ICD-10-CM | POA: Diagnosis not present

## 2019-01-03 DIAGNOSIS — R413 Other amnesia: Secondary | ICD-10-CM | POA: Diagnosis not present

## 2019-01-03 DIAGNOSIS — R0600 Dyspnea, unspecified: Secondary | ICD-10-CM | POA: Diagnosis not present

## 2019-01-06 ENCOUNTER — Ambulatory Visit
Admission: RE | Admit: 2019-01-06 | Discharge: 2019-01-06 | Disposition: A | Discharge status: Home / Self Care | Payer: Medicare Other | Source: Ambulatory Visit | Attending: Geriatric Medicine | Admitting: Geriatric Medicine

## 2019-01-06 ENCOUNTER — Other Ambulatory Visit: Payer: Self-pay

## 2019-01-06 DIAGNOSIS — K5730 Diverticulosis of large intestine without perforation or abscess without bleeding: Secondary | ICD-10-CM | POA: Diagnosis not present

## 2019-01-06 DIAGNOSIS — R1031 Right lower quadrant pain: Secondary | ICD-10-CM

## 2019-01-06 MED ORDER — IOPAMIDOL (ISOVUE-300) INJECTION 61%
70.0000 mL | Freq: Once | INTRAVENOUS | Status: AC | PRN
  Administered 2019-01-06: 70 mL via INTRAVENOUS

## 2019-01-08 ENCOUNTER — Telehealth (HOSPITAL_COMMUNITY): Payer: Self-pay | Admitting: *Deleted

## 2019-01-08 DIAGNOSIS — I4819 Other persistent atrial fibrillation: Secondary | ICD-10-CM | POA: Diagnosis not present

## 2019-01-08 MED ORDER — DILTIAZEM HCL ER COATED BEADS 180 MG PO CP24: 180.0000 mg | ORAL_CAPSULE | Freq: Every day | ORAL | 3 refills | Status: DC

## 2019-01-08 NOTE — Telephone Encounter (Signed)
-----   Message from Sherran Needs, NP sent at 01/08/2019  2:44 PM EDT ----- Please let pt know that her monitor overall looked ok showed SR, no afib. She dd have  some spells of slow HR/junctonal rhythm that may make her symptomatic.Marland KitchenThere were no episodes of heart block and no pauses.  Will lower her rate control to cardzem 180 mg daily from 240 mg or go back to 100 mg amio daily.

## 2019-01-08 NOTE — Telephone Encounter (Signed)
pts daughter notified. Will decrease cardizem to 180mg  a day - she will monitor BP/HR and symptoms over the next week and report back in with response.

## 2019-01-21 NOTE — Telephone Encounter (Signed)
Patient daughter called in with report of BP/HR - feels no different still very fatigued. 136/72 hr 57, 153/72 hr 60, 127/62 hr 58, 104/48 hr 48, 126/64 hr 43, 90/52 Hr 47, 92/52 Hr 42. Per Roderic Palau NP - pt will decrease amiodarone to 100mg  a day and monitor if no improvement will bring in for assessment.

## 2019-01-27 NOTE — Telephone Encounter (Signed)
Pt continues to feel tired. HR running in the 55-69 BP 104-156/60-86. Per Roderic Palau NP bring in for office visit for screening labs since pts continued to feel the same despite medication changes.

## 2019-01-29 ENCOUNTER — Encounter (HOSPITAL_COMMUNITY): Payer: Self-pay | Admitting: Nurse Practitioner

## 2019-01-29 ENCOUNTER — Other Ambulatory Visit: Payer: Self-pay

## 2019-01-29 ENCOUNTER — Ambulatory Visit (HOSPITAL_COMMUNITY)
Admission: RE | Admit: 2019-01-29 | Discharge: 2019-01-29 | Disposition: A | Discharge status: Home / Self Care | Payer: Medicare Other | Source: Ambulatory Visit | Attending: Nurse Practitioner | Admitting: Nurse Practitioner

## 2019-01-29 VITALS — BP 134/76 | HR 62 | Ht 64.0 in | Wt 157.0 lb

## 2019-01-29 DIAGNOSIS — I4891 Unspecified atrial fibrillation: Secondary | ICD-10-CM | POA: Diagnosis present

## 2019-01-29 DIAGNOSIS — Z96643 Presence of artificial hip joint, bilateral: Secondary | ICD-10-CM | POA: Insufficient documentation

## 2019-01-29 DIAGNOSIS — Z7901 Long term (current) use of anticoagulants: Secondary | ICD-10-CM | POA: Insufficient documentation

## 2019-01-29 DIAGNOSIS — Z886 Allergy status to analgesic agent status: Secondary | ICD-10-CM | POA: Insufficient documentation

## 2019-01-29 DIAGNOSIS — I110 Hypertensive heart disease with heart failure: Secondary | ICD-10-CM | POA: Diagnosis not present

## 2019-01-29 DIAGNOSIS — Z87891 Personal history of nicotine dependence: Secondary | ICD-10-CM | POA: Insufficient documentation

## 2019-01-29 DIAGNOSIS — I4819 Other persistent atrial fibrillation: Secondary | ICD-10-CM | POA: Insufficient documentation

## 2019-01-29 DIAGNOSIS — I509 Heart failure, unspecified: Secondary | ICD-10-CM | POA: Insufficient documentation

## 2019-01-29 DIAGNOSIS — I4892 Unspecified atrial flutter: Secondary | ICD-10-CM | POA: Insufficient documentation

## 2019-01-29 DIAGNOSIS — Z79899 Other long term (current) drug therapy: Secondary | ICD-10-CM | POA: Diagnosis not present

## 2019-01-29 LAB — TSH: TSH: 3.004 u[IU]/mL (ref 0.350–4.500)

## 2019-01-29 LAB — COMPREHENSIVE METABOLIC PANEL
ALT: 15 U/L (ref 0–44)
AST: 15 U/L (ref 15–41)
Albumin: 3.7 g/dL (ref 3.5–5.0)
Alkaline Phosphatase: 61 U/L (ref 38–126)
Anion gap: 8 (ref 5–15)
BUN: 24 mg/dL — ABNORMAL HIGH (ref 8–23)
CO2: 27 mmol/L (ref 22–32)
Calcium: 8.6 mg/dL — ABNORMAL LOW (ref 8.9–10.3)
Chloride: 103 mmol/L (ref 98–111)
Creatinine, Ser: 1.25 mg/dL — ABNORMAL HIGH (ref 0.44–1.00)
GFR calc Af Amer: 44 mL/min — ABNORMAL LOW (ref >60)
GFR calc non Af Amer: 38 mL/min — ABNORMAL LOW (ref >60)
Glucose, Bld: 90 mg/dL (ref 70–99)
Potassium: 4.5 mmol/L (ref 3.5–5.1)
Sodium: 138 mmol/L (ref 135–145)
Total Bilirubin: 0.9 mg/dL (ref 0.3–1.2)
Total Protein: 6.5 g/dL (ref 6.5–8.1)

## 2019-01-29 LAB — CBC
HCT: 43.1 % (ref 36.0–46.0)
Hemoglobin: 13.8 g/dL (ref 12.0–15.0)
MCH: 31.4 pg (ref 26.0–34.0)
MCHC: 32 g/dL (ref 30.0–36.0)
MCV: 98 fL (ref 80.0–100.0)
Platelets: 190 10*3/uL (ref 150–400)
RBC: 4.4 MIL/uL (ref 3.87–5.11)
RDW: 14.9 % (ref 11.5–15.5)
WBC: 5.6 10*3/uL (ref 4.0–10.5)
nRBC: 0 % (ref 0.0–0.2)

## 2019-01-29 NOTE — Progress Notes (Signed)
Primary Care Physician: Stephanie Manes, MD Referring Physician: Dr. Felipa Ritter EP: Dr. Dahlia Bailiff Ritter is a 85 y.o. female with a h/o paroxysmal afib with conversion to SR with amiodarone, which did not require a cardioversion. She is s/p hernia repair 08/07/18 and has recovered nicely. She did note one episode of bradycardia associated with some lightheadedness after her surgery. Denies syncope or falls.  F/u in afib clinic, 7/1. She has had her  meds adjusted and wore a monitor for her daughter reporting that times her reporting that she didn't feel good. Her monitor did not show any afib or significant brady/pauses. She did have some junctional rhythm, which she triggered but also triggered some SR. Today in the office, she does not report any significant issues, no weak or lightheaded spells. No falls. She thinks she is just bored and depressed for having to stay in so much with covid.   Today, she denies symptoms of palpitations, chest pain, shortness of breath, orthopnea, PND, lower extremity edema, presyncope, syncope, or neurologic sequela.  The patient is tolerating medications without difficulties and is otherwise without complaint today.   Past Medical History:  Diagnosis Date  . CHF (congestive heart failure) ([facility])   . Hypertension   . Persistent atrial fibrillation   . Tinnitus    Past Surgical History:  Procedure Laterality Date  . APPENDECTOMY     60 years ago  . ATRIAL FIBRILLATION ABLATION N/A    8 years ago  . BOWEL RESECTION N/A 08/07/2018   Procedure: SMALL BOWEL RESECTION;  Surgeon: Stephanie Riley, MD;  Location: [facility];  Service: General;  Laterality: N/A;  . hip replacement Bilateral    4 and 8 years ago  . INGUINAL HERNIA REPAIR Right 08/07/2018   Procedure: Laparoscopic right femoral hernia repair with mesh, possible open, possible bowel resection;  Surgeon: Stephanie Riley, MD;  Location: [facility];  Service: General;  Laterality: Right;    Current  Outpatient Medications  Medication Sig Dispense Refill  . acetaminophen (TYLENOL) 325 MG tablet Take 2 tablets (650 mg total) by mouth every 6 (six) hours.    Marland Kitchen amiodarone (PACERONE) 200 MG tablet Take 0.5 tablets (100 mg total) by mouth daily. 45 tablet 1  . apixaban (ELIQUIS) 5 MG TABS tablet Take 1 tablet (5 mg total) by mouth 2 (two) times daily. 180 tablet 2  . Carboxymethylcellulose Sodium (EYE DROPS OP) Place 2 drops into both eyes as needed (for dry eyes).     Marland Kitchen diltiazem (CARDIZEM CD) 180 MG 24 hr capsule Take 1 capsule (180 mg total) by mouth daily. 30 capsule 3  . escitalopram (LEXAPRO) 20 MG tablet Take 20 mg by mouth daily.   11  . Multiple Vitamins-Minerals (PRESERVISION/LUTEIN) CAPS Take 1 capsule by mouth 2 (two) times daily.     Marland Kitchen MYRBETRIQ 25 MG TB24 tablet 1 tablet daily.    . traZODone (DESYREL) 50 MG tablet 25 mg at bedtime.     No current facility-administered medications for this encounter.     Allergies  Allergen Reactions  . Asa [Aspirin] Other (See Comments)    Excessive bleeding  . Nsaids Other (See Comments)    Bleeding, ulcers    Social History   Socioeconomic History  . Marital status: Married    Spouse name: Not on file  . Number of children: Not on file  . Years of education: Not on file  . Highest education level: Not on file  Occupational History  .  Not on file  Social Needs  . Financial resource strain: Not on file  . Food insecurity    Worry: Not on file    Inability: Not on file  . Transportation needs    Medical: Not on file    Non-medical: Not on file  Tobacco Use  . Smoking status: Former Smoker    Types: Cigarettes  . Smokeless tobacco: Never Used  . Tobacco comment: Pt quit 50+ years ago  Substance and Sexual Activity  . Alcohol use: Yes    Alcohol/week: 1.0 standard drinks    Types: 1 Glasses of wine per week    Comment: occasionally  . Drug use: No  . Sexual activity: Not on file  Lifestyle  . Physical activity    Days  per week: Not on file    Minutes per session: Not on file  . Stress: Not on file  Relationships  . Social Herbalist on phone: Not on file    Gets together: Not on file    Attends religious service: Not on file    Active member of club or organization: Not on file    Attends meetings of clubs or organizations: Not on file    Relationship status: Not on file  . Intimate partner violence    Fear of current or ex partner: Not on file    Emotionally abused: Not on file    Physically abused: Not on file    Forced sexual activity: Not on file  Other Topics Concern  . Not on file  Social History Narrative  . Not on file    Family History  Family history unknown: Yes    ROS- All systems are reviewed and negative except as per the HPI above  Physical Exam: Vitals:   01/29/19 1540  BP: 134/76  Pulse: 62  Weight: 71.2 kg  Height: 5\' 4"  (1.626 m)   Wt Readings from Last 3 Encounters:  01/29/19 71.2 kg  09/16/18 69.9 kg  08/29/18 69.7 kg    Labs: Lab Results  Component Value Date   NA 136 08/08/2018   K 4.6 08/08/2018   CL 101 08/08/2018   CO2 25 08/08/2018   GLUCOSE 135 (H) 08/08/2018   BUN 18 08/08/2018   CREATININE 1.12 (H) 08/08/2018   CALCIUM 8.4 (L) 08/08/2018   No results found for: INR No results found for: CHOL, HDL, LDLCALC, TRIG   GEN- The patient is well appearing elderly female, alert and oriented x 3 today.   HEENT-head normocephalic, atraumatic, sclera clear, conjunctiva pink, hearing intact, trachea midline. Lungs- Clear to ausculation bilaterally, normal work of breathing Heart- Regular rate and rhythm, no murmurs, rubs or gallops  GI- soft, NT, ND, + BS Extremities- no clubbing, cyanosis, or edema MS- no significant deformity or atrophy Skin- no rash or lesion Psych- euthymic mood, full affect Neuro- strength and sensation are intact   EKG- SR HR 58, LAD, 1st degree AV blockslow R wave prog, LVH, PR 218, QRS 112, QTc 463.    Assessment and Plan: 1. Persistent Afib/flutter Continues in SR. Did have some junctional rhythm by monitor but not off balance or falls  No afib noted Continue reduced  Amiodarone  100 mg daily. Continue at reduced  diltiazem dose 180 mg daily Continue Eliquis 5 mg BID  Cbc/cmet/tsh today  2. HTN Stable, no changes today.  F/u in 3 months  [facility] [place],  [place] [address] [phone]

## 2019-02-15 IMAGING — CR DG HAND COMPLETE 3+V*L*
4 series · 4 of 4 positions shown · non-contrast
Comparison: None.

CLINICAL DATA: Fall on 09/29/2017.  Left hand pain and swelling.

EXAM:
LEFT HAND - COMPLETE 3+ VIEW

[x hand pa left (1 of 2)]
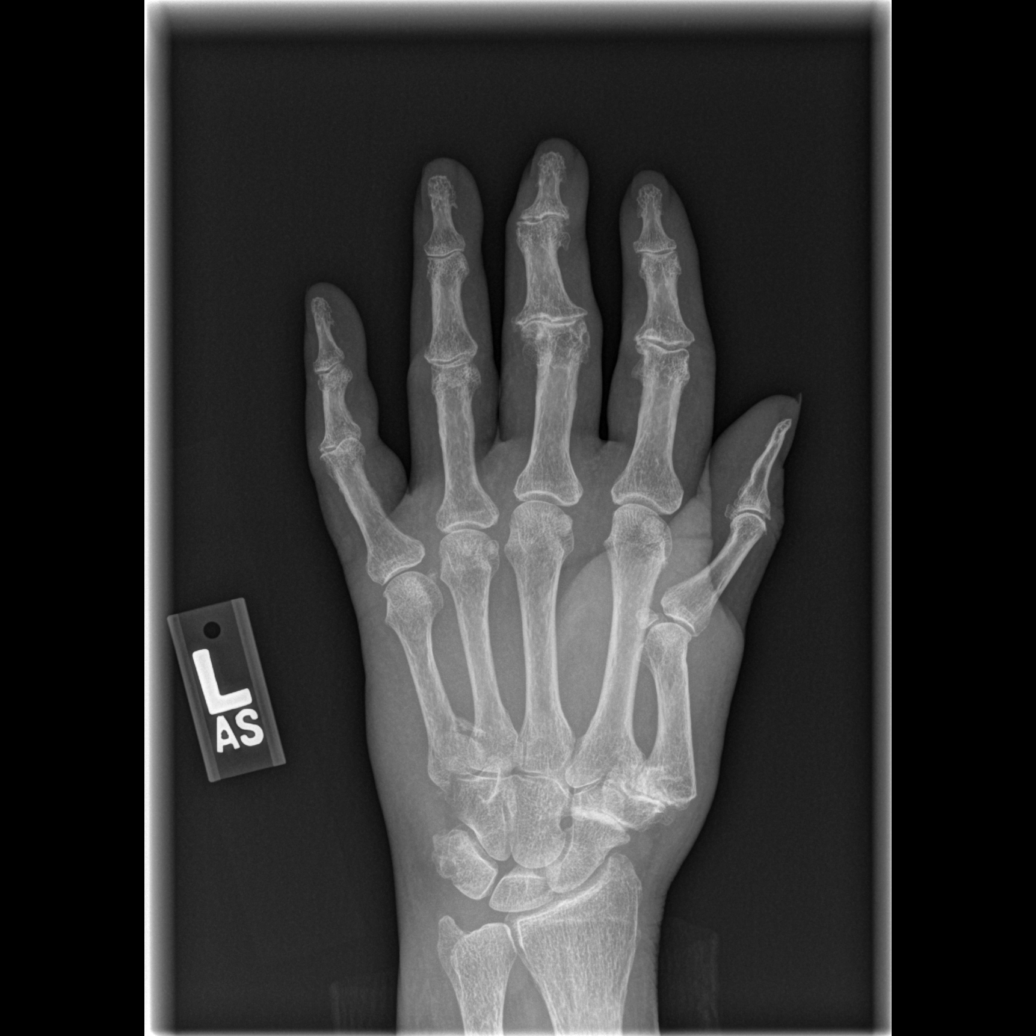

[x hand oblique left]
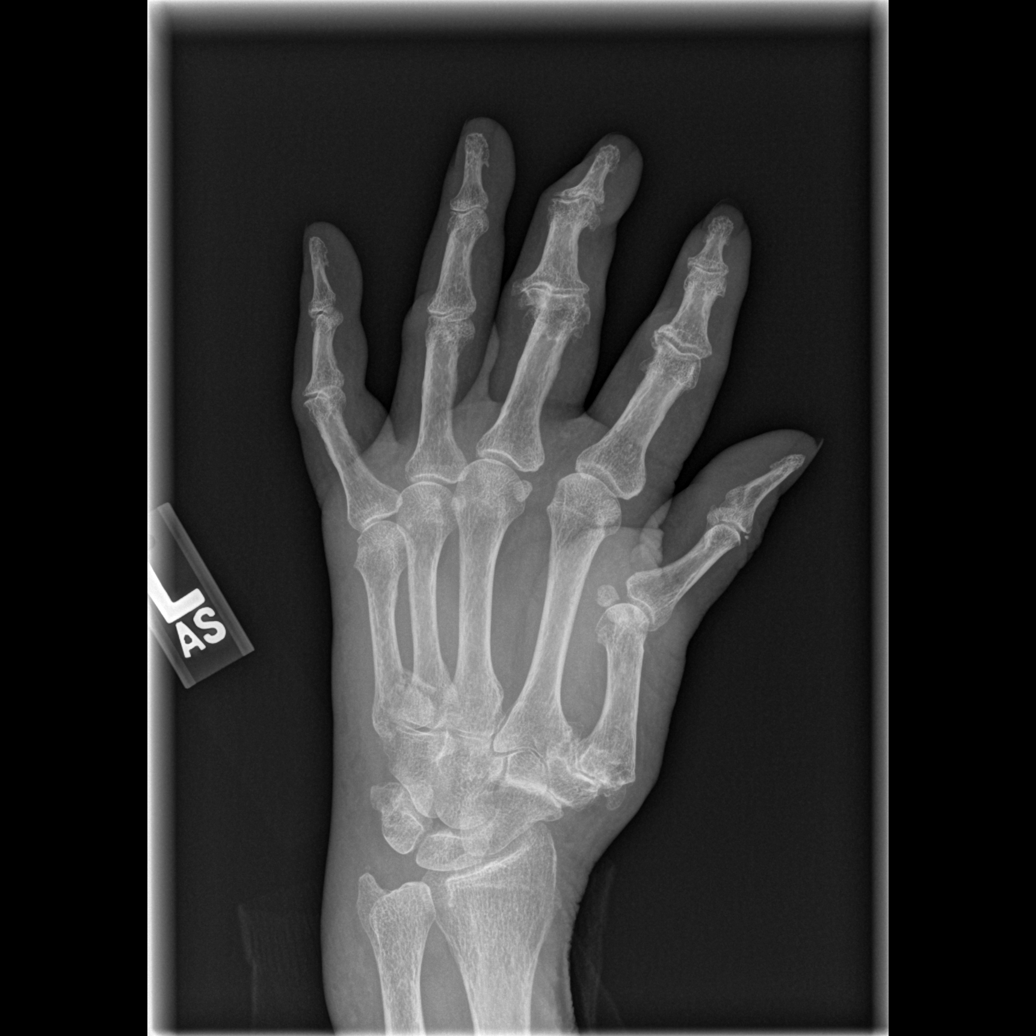

[x hand lat left]
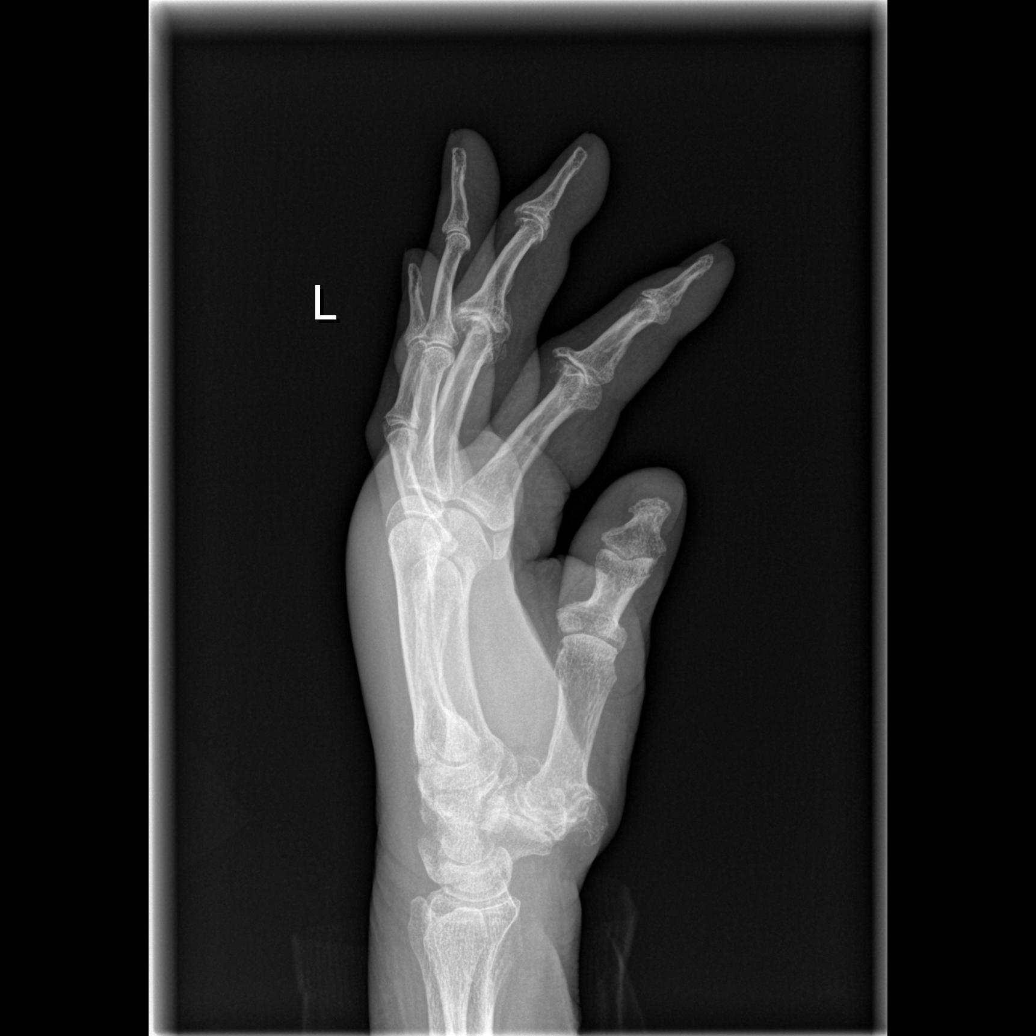

[x hand pa left (2 of 2)]
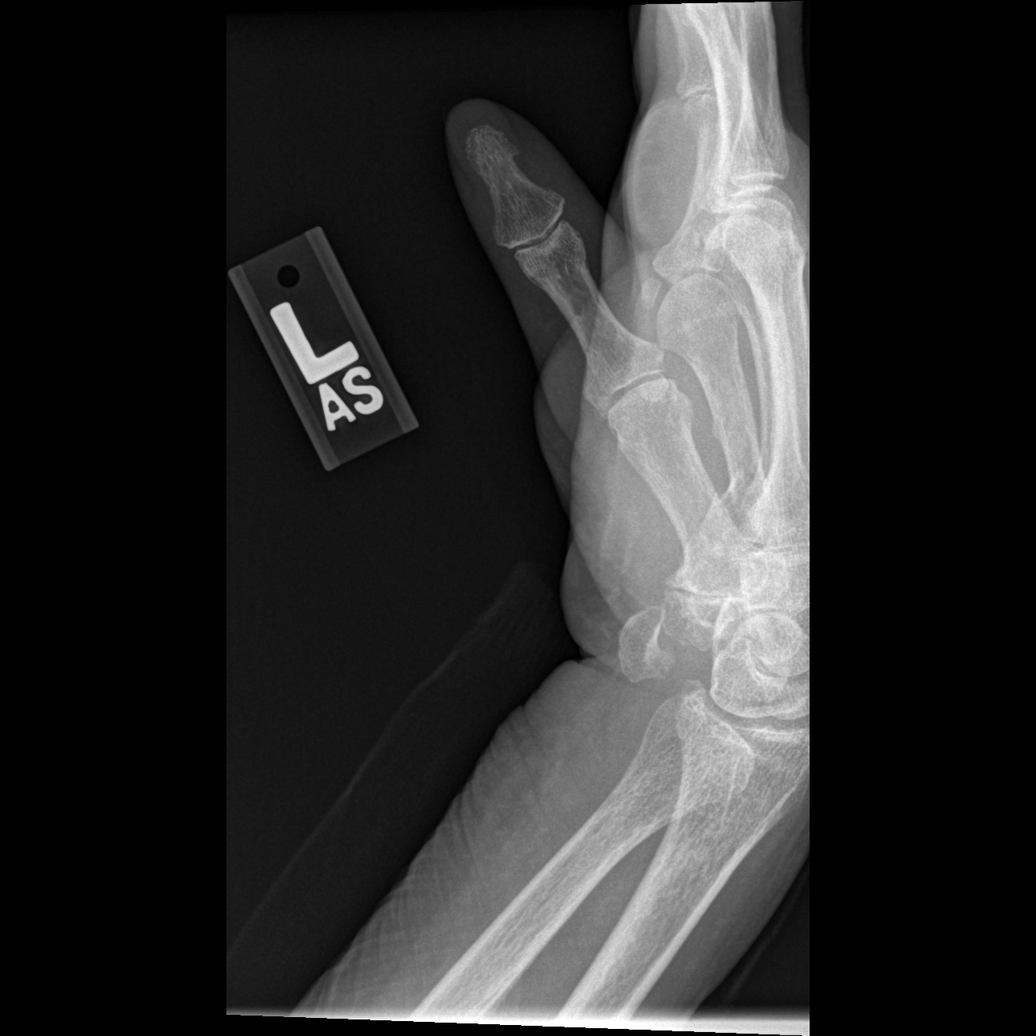

[4 of 4 positions shown; findings below may reference images not displayed]

FINDINGS: Mildly comminuted fracture at the base of the fifth metacarpal,
without significant displacement.

No other evidence of an acute fracture.  No dislocation.

There arthropathic changes with joint space narrowing, marginal
osteophytes and subchondral sclerosis at the scaphoid, trapezium
trapezoid articulation and the trapezium first metacarpal
articulation. There is also asymmetric joint space narrowing with
marginal osteophytes involving multiple interphalangeal joints, most
severely the PIP joint of the middle finger. These findings are
consistent with osteoarthritis.

Bones are demineralized.

Mild ulnar sided soft tissue swelling.
IMPRESSION: 1. Fracture at the base of the left fifth metacarpal without
significant displacement.
2. No other fractures.  No dislocation.
3. Osteoarthritis

## 2019-02-18 ENCOUNTER — Telehealth (HOSPITAL_COMMUNITY): Payer: Self-pay | Admitting: *Deleted

## 2019-02-18 NOTE — Telephone Encounter (Signed)
Patient daughter called in stating pt is now having intermittent dizziness with ambulation. Per Roderic Palau NP will have her follow up with Dr. Rayann Heman to discuss possible pacemaker. Daughter notified.

## 2019-02-25 ENCOUNTER — Telehealth: Payer: Self-pay | Admitting: Internal Medicine

## 2019-02-25 NOTE — Telephone Encounter (Signed)
Per Roderic Palau pt needs to talk to [facility] to discuss poss pacemaker. Lmm@ [phone] to schedule virtual appt.

## 2019-02-27 ENCOUNTER — Other Ambulatory Visit (HOSPITAL_COMMUNITY): Payer: Self-pay | Admitting: *Deleted

## 2019-02-27 MED ORDER — DILTIAZEM HCL ER COATED BEADS 180 MG PO CP24: 180.0000 mg | ORAL_CAPSULE | Freq: Every day | ORAL | 1 refills | Status: DC

## 2019-03-07 ENCOUNTER — Telehealth: Payer: Self-pay

## 2019-03-07 NOTE — Telephone Encounter (Signed)
-----   Message from Damian Leavell, RN sent at 03/07/2019 11:47 AM EDT ----- Regarding: FW: appt w allred  ----- Message ----- From: Juluis Mire, RN Sent: 03/06/2019   4:35 PM EDT To: Damian Leavell, RN Subject: FW: appt w allred                              Not sure if [facility] did anything with this or not.... ----- Message ----- From: Juluis Mire, RN Sent: 02/18/2019  10:53 AM EDT To: Debbora Dus Subject: appt w allred                                  Per donna pt needs appt with allred to discuss possible pacemaker. Please call her daughter elaine hammer to schedule appt. Can be virtual per [employer]

## 2019-03-07 NOTE — Telephone Encounter (Signed)
I called and left patients daughter Margaretha Sheffield to see if patient wanted to do a virtual visit with Dr. Rayann Heman on 03/10/19 at [place].

## 2019-03-12 ENCOUNTER — Encounter: Payer: Self-pay | Admitting: Internal Medicine

## 2019-03-17 DIAGNOSIS — Z0000 Encounter for general adult medical examination without abnormal findings: Secondary | ICD-10-CM | POA: Diagnosis not present

## 2019-03-17 DIAGNOSIS — F330 Major depressive disorder, recurrent, mild: Secondary | ICD-10-CM | POA: Diagnosis not present

## 2019-03-17 DIAGNOSIS — Z79899 Other long term (current) drug therapy: Secondary | ICD-10-CM | POA: Diagnosis not present

## 2019-03-17 DIAGNOSIS — I129 Hypertensive chronic kidney disease with stage 1 through stage 4 chronic kidney disease, or unspecified chronic kidney disease: Secondary | ICD-10-CM | POA: Diagnosis not present

## 2019-03-17 DIAGNOSIS — N183 Chronic kidney disease, stage 3 (moderate): Secondary | ICD-10-CM | POA: Diagnosis not present

## 2019-03-17 DIAGNOSIS — D6869 Other thrombophilia: Secondary | ICD-10-CM | POA: Diagnosis not present

## 2019-03-17 DIAGNOSIS — I480 Paroxysmal atrial fibrillation: Secondary | ICD-10-CM | POA: Diagnosis not present

## 2019-03-17 DIAGNOSIS — J300 Vasomotor rhinitis: Secondary | ICD-10-CM | POA: Diagnosis not present

## 2019-03-17 NOTE — Telephone Encounter (Signed)
Patients daughter did not return call about setting up virtual visit with Dr. Rayann Heman.

## 2019-03-19 ENCOUNTER — Ambulatory Visit (HOSPITAL_COMMUNITY): Payer: Medicare Other | Admitting: Nurse Practitioner

## 2019-04-03 DIAGNOSIS — Z23 Encounter for immunization: Secondary | ICD-10-CM | POA: Diagnosis not present

## 2019-04-09 ENCOUNTER — Encounter (INDEPENDENT_AMBULATORY_CARE_PROVIDER_SITE_OTHER): Payer: Medicare Other | Admitting: Ophthalmology

## 2019-04-17 ENCOUNTER — Encounter (INDEPENDENT_AMBULATORY_CARE_PROVIDER_SITE_OTHER): Payer: Medicare Other | Admitting: Ophthalmology

## 2019-04-17 ENCOUNTER — Other Ambulatory Visit (HOSPITAL_COMMUNITY): Payer: Self-pay | Admitting: Nurse Practitioner

## 2019-05-08 ENCOUNTER — Ambulatory Visit (HOSPITAL_COMMUNITY): Payer: Medicare Other | Admitting: Nurse Practitioner

## 2019-05-15 ENCOUNTER — Ambulatory Visit (HOSPITAL_COMMUNITY)
Admission: RE | Admit: 2019-05-15 | Discharge: 2019-05-15 | Disposition: A | Discharge status: Home / Self Care | Payer: Medicare Other | Source: Ambulatory Visit | Attending: Nurse Practitioner | Admitting: Nurse Practitioner

## 2019-05-15 ENCOUNTER — Other Ambulatory Visit: Payer: Self-pay

## 2019-05-15 ENCOUNTER — Encounter (HOSPITAL_COMMUNITY): Payer: Self-pay | Admitting: Nurse Practitioner

## 2019-05-15 VITALS — BP 150/72 | HR 57 | Ht 64.0 in | Wt 159.6 lb

## 2019-05-15 DIAGNOSIS — I444 Left anterior fascicular block: Secondary | ICD-10-CM | POA: Diagnosis not present

## 2019-05-15 DIAGNOSIS — I517 Cardiomegaly: Secondary | ICD-10-CM | POA: Insufficient documentation

## 2019-05-15 DIAGNOSIS — R001 Bradycardia, unspecified: Secondary | ICD-10-CM | POA: Insufficient documentation

## 2019-05-15 DIAGNOSIS — I480 Paroxysmal atrial fibrillation: Secondary | ICD-10-CM | POA: Insufficient documentation

## 2019-05-15 DIAGNOSIS — I440 Atrioventricular block, first degree: Secondary | ICD-10-CM | POA: Diagnosis not present

## 2019-05-15 DIAGNOSIS — I4819 Other persistent atrial fibrillation: Secondary | ICD-10-CM | POA: Diagnosis not present

## 2019-05-15 NOTE — Progress Notes (Signed)
Primary Care Physician: Lajean Manes, MD Referring Physician: Dr. Felipa Eth EP: Dr. Dahlia Bailiff Stephanie Ritter is a 85 y.o. female with a h/o paroxysmal afib with conversion to SR with amiodarone, which did not require a cardioversion. She is s/p hernia repair 08/07/18 and has recovered nicely. She did note one episode of bradycardia associated with some lightheadedness after her surgery. Denies syncope or falls.  F/u in afib clinic, 7/1. She has had her  meds adjusted and wore a monitor for her daughter reporting that times her reporting that she didn't feel good. Her monitor did not show any afib or significant brady/pauses. She did have some junctional rhythm, which she triggered but also triggered some SR. Today in the office, she does not report any significant issues, no weak or lightheaded spells. No falls. She thinks she is just bored and depressed for having to stay in so much with covid.   F/u in afib clinic, 05/15/19. She feels at her baseline. Feels alittle  fatigue at times but no unusual dizziness. She is in SR today.   Today, she denies symptoms of palpitations, chest pain, shortness of breath, orthopnea, PND, lower extremity edema, presyncope, syncope, or neurologic sequela.  The patient is tolerating medications without difficulties and is otherwise without complaint today.   Past Medical History:  Diagnosis Date  . CHF (congestive heart failure) ([place])   . Hypertension   . Persistent atrial fibrillation   . Tinnitus    Past Surgical History:  Procedure Laterality Date  . APPENDECTOMY     60 years ago  . ATRIAL FIBRILLATION ABLATION N/A    8 years ago  . BOWEL RESECTION N/A 08/07/2018   Procedure: SMALL BOWEL RESECTION;  Surgeon: Clovis Riley, MD;  Location: [facility];  Service: General;  Laterality: N/A;  . hip replacement Bilateral    4 and 8 years ago  . INGUINAL HERNIA REPAIR Right 08/07/2018   Procedure: Laparoscopic right femoral hernia repair with mesh, possible  open, possible bowel resection;  Surgeon: Clovis Riley, MD;  Location: [facility];  Service: General;  Laterality: Right;    Current Outpatient Medications  Medication Sig Dispense Refill  . acetaminophen (TYLENOL) 325 MG tablet Take 2 tablets (650 mg total) by mouth every 6 (six) hours. (Patient taking differently: Take 650 mg by mouth as needed. )    . amiodarone (PACERONE) 200 MG tablet TAKE ONE-HALF TABLET BY  MOUTH DAILY 45 tablet 3  . apixaban (ELIQUIS) 5 MG TABS tablet Take 1 tablet (5 mg total) by mouth 2 (two) times daily. 180 tablet 2  . Carboxymethylcellulose Sodium (EYE DROPS OP) Place 2 drops into both eyes as needed (for dry eyes).     Marland Kitchen diltiazem (CARDIZEM CD) 180 MG 24 hr capsule Take 1 capsule (180 mg total) by mouth daily. 90 capsule 1  . escitalopram (LEXAPRO) 20 MG tablet Take 20 mg by mouth daily.   11  . ipratropium (ATROVENT) 0.03 % nasal spray as needed.     . Multiple Vitamins-Minerals (PRESERVISION/LUTEIN) CAPS Take 1 capsule by mouth 2 (two) times daily.     Marland Kitchen MYRBETRIQ 25 MG TB24 tablet 1 tablet daily.    . traZODone (DESYREL) 50 MG tablet 25 mg at bedtime.     No current facility-administered medications for this encounter.     Allergies  Allergen Reactions  . Asa [Aspirin] Other (See Comments)    Excessive bleeding  . Nsaids Other (See Comments)    Bleeding, ulcers  Social History   Socioeconomic History  . Marital status: Married    Spouse name: Not on file  . Number of children: Not on file  . Years of education: Not on file  . Highest education level: Not on file  Occupational History  . Not on file  Social Needs  . Financial resource strain: Not on file  . Food insecurity    Worry: Not on file    Inability: Not on file  . Transportation needs    Medical: Not on file    Non-medical: Not on file  Tobacco Use  . Smoking status: Former Smoker    Types: Cigarettes  . Smokeless tobacco: Never Used  . Tobacco comment: Pt quit 50+ years ago   Substance and Sexual Activity  . Alcohol use: Yes    Alcohol/week: 1.0 standard drinks    Types: 1 Glasses of wine per week    Comment: occasionally  . Drug use: No  . Sexual activity: Not on file  Lifestyle  . Physical activity    Days per week: Not on file    Minutes per session: Not on file  . Stress: Not on file  Relationships  . Social Herbalist on phone: Not on file    Gets together: Not on file    Attends religious service: Not on file    Active member of club or organization: Not on file    Attends meetings of clubs or organizations: Not on file    Relationship status: Not on file  . Intimate partner violence    Fear of current or ex partner: Not on file    Emotionally abused: Not on file    Physically abused: Not on file    Forced sexual activity: Not on file  Other Topics Concern  . Not on file  Social History Narrative  . Not on file    Family History  Family history unknown: Yes    ROS- All systems are reviewed and negative except as per the HPI above  Physical Exam: Vitals:   05/15/19 1543  BP: (!) 150/72  Pulse: (!) 57  Weight: 72.4 kg  Height: 5\' 4"  (1.626 m)   Wt Readings from Last 3 Encounters:  05/15/19 72.4 kg  01/29/19 71.2 kg  09/16/18 69.9 kg    Labs: Lab Results  Component Value Date   NA 138 01/29/2019   K 4.5 01/29/2019   CL 103 01/29/2019   CO2 27 01/29/2019   GLUCOSE 90 01/29/2019   BUN 24 (H) 01/29/2019   CREATININE 1.25 (H) 01/29/2019   CALCIUM 8.6 (L) 01/29/2019   No results found for: INR No results found for: CHOL, HDL, LDLCALC, TRIG   GEN- The patient is well appearing elderly female, alert and oriented x 3 today.   HEENT-head normocephalic, atraumatic, sclera clear, conjunctiva pink, hearing intact, trachea midline. Lungs- Clear to ausculation bilaterally, normal work of breathing Heart- Regular rate and rhythm, no murmurs, rubs or gallops  GI- soft, NT, ND, + BS Extremities- no clubbing, cyanosis,  or edema MS- no significant deformity or atrophy Skin- no rash or lesion Psych- euthymic mood, full affect Neuro- strength and sensation are intact   EKG- Sinus brady with first degree block, LAFB, v rate 57 bpm, qrs int 110 ms, qtc 445 ms   Assessment and Plan: 1. Persistent Afib/flutter Continues in SR. Did have some junctional rhythm by monitor but not off balance or falls  No recent dizziness  Continue reduced  Amiodarone 100 mg daily. Continue at reduced  diltiazem dose 180 mg daily Continue Eliquis 5 mg BID   2. HTN Stable, no changes today.  F/u in 6 months  [facility] [address] [place], [place] [address] [phone]

## 2019-05-21 DIAGNOSIS — R413 Other amnesia: Secondary | ICD-10-CM | POA: Diagnosis not present

## 2019-06-19 ENCOUNTER — Encounter (INDEPENDENT_AMBULATORY_CARE_PROVIDER_SITE_OTHER): Payer: Medicare Other | Admitting: Ophthalmology

## 2019-08-10 ENCOUNTER — Ambulatory Visit: Payer: Medicare Other | Attending: Internal Medicine

## 2019-08-10 DIAGNOSIS — Z23 Encounter for immunization: Secondary | ICD-10-CM | POA: Diagnosis not present

## 2019-08-10 NOTE — Progress Notes (Signed)
   Covid-19 Vaccination Clinic  Name:  Stephanie Ritter    MRN: [id number] DOB: 04-09-1927  08/10/2019  [name] was observed post Covid-19 immunization for 30 minutes based on pre-vaccination screening without incidence. She was provided with Vaccine Information Sheet and instruction to access the V-Safe system.   Ms. Minor was instructed to call 911 with any severe reactions post vaccine: Marland Kitchen Difficulty breathing  . Swelling of your face and throat  . A fast heartbeat  . A bad rash all over your body  . Dizziness and weakness    Immunizations Administered    Name Date Dose VIS Date Route   Pfizer COVID-19 Vaccine 08/10/2019  2:34 PM 0.3 mL 07/11/2019 Intramuscular   Manufacturer: [place]   Lot: [id number]   [place]: [id number]

## 2019-08-26 ENCOUNTER — Other Ambulatory Visit (HOSPITAL_COMMUNITY): Payer: Self-pay | Admitting: Nurse Practitioner

## 2019-08-29 ENCOUNTER — Ambulatory Visit: Payer: Self-pay

## 2019-08-30 ENCOUNTER — Ambulatory Visit: Payer: Medicare Other | Attending: Internal Medicine

## 2019-08-30 DIAGNOSIS — Z23 Encounter for immunization: Secondary | ICD-10-CM | POA: Insufficient documentation

## 2019-08-30 NOTE — Progress Notes (Signed)
   Covid-19 Vaccination Clinic  Name:  Natilynn Durie    MRN: [id number] DOB: 06/09/1927  08/30/2019  Ms. Wiesman was observed post Covid-19 immunization for 15 minutes without incidence. She was provided with Vaccine Information Sheet and instruction to access the V-Safe system.   Ms. Julio was instructed to call 911 with any severe reactions post vaccine: Marland Kitchen Difficulty breathing  . Swelling of your face and throat  . A fast heartbeat  . A bad rash all over your body  . Dizziness and weakness    Immunizations Administered    Name Date Dose VIS Date Route   Pfizer COVID-19 Vaccine 08/30/2019  1:35 PM 0.3 mL 07/11/2019 Intramuscular   Manufacturer: [place]   Lot: [id number]   [place]: [id number]

## 2019-09-01 DIAGNOSIS — G4733 Obstructive sleep apnea (adult) (pediatric): Secondary | ICD-10-CM | POA: Diagnosis not present

## 2019-09-02 DIAGNOSIS — G4733 Obstructive sleep apnea (adult) (pediatric): Secondary | ICD-10-CM | POA: Diagnosis not present

## 2019-11-13 DIAGNOSIS — M79606 Pain in leg, unspecified: Secondary | ICD-10-CM | POA: Diagnosis not present

## 2019-11-17 DIAGNOSIS — Z0000 Encounter for general adult medical examination without abnormal findings: Secondary | ICD-10-CM | POA: Diagnosis not present

## 2019-11-17 DIAGNOSIS — F330 Major depressive disorder, recurrent, mild: Secondary | ICD-10-CM | POA: Diagnosis not present

## 2019-11-17 DIAGNOSIS — Z1389 Encounter for screening for other disorder: Secondary | ICD-10-CM | POA: Diagnosis not present

## 2019-11-20 ENCOUNTER — Encounter (HOSPITAL_COMMUNITY): Payer: Self-pay | Admitting: Nurse Practitioner

## 2019-11-20 ENCOUNTER — Other Ambulatory Visit: Payer: Self-pay

## 2019-11-20 ENCOUNTER — Ambulatory Visit (HOSPITAL_COMMUNITY)
Admission: RE | Admit: 2019-11-20 | Discharge: 2019-11-20 | Disposition: A | Discharge status: Home / Self Care | Payer: Medicare Other | Source: Ambulatory Visit | Attending: Nurse Practitioner | Admitting: Nurse Practitioner

## 2019-11-20 VITALS — BP 140/60 | HR 71 | Ht 64.0 in | Wt 167.6 lb

## 2019-11-20 DIAGNOSIS — I4819 Other persistent atrial fibrillation: Secondary | ICD-10-CM

## 2019-11-20 DIAGNOSIS — Z87891 Personal history of nicotine dependence: Secondary | ICD-10-CM | POA: Diagnosis not present

## 2019-11-20 DIAGNOSIS — I509 Heart failure, unspecified: Secondary | ICD-10-CM | POA: Diagnosis not present

## 2019-11-20 DIAGNOSIS — Z79899 Other long term (current) drug therapy: Secondary | ICD-10-CM | POA: Insufficient documentation

## 2019-11-20 DIAGNOSIS — D6869 Other thrombophilia: Secondary | ICD-10-CM

## 2019-11-20 DIAGNOSIS — I110 Hypertensive heart disease with heart failure: Secondary | ICD-10-CM | POA: Insufficient documentation

## 2019-11-20 LAB — COMPREHENSIVE METABOLIC PANEL
ALT: 12 U/L (ref 0–44)
AST: 17 U/L (ref 15–41)
Albumin: 3.6 g/dL (ref 3.5–5.0)
Alkaline Phosphatase: 59 U/L (ref 38–126)
Anion gap: 9 (ref 5–15)
BUN: 19 mg/dL (ref 8–23)
CO2: 27 mmol/L (ref 22–32)
Calcium: 8.7 mg/dL — ABNORMAL LOW (ref 8.9–10.3)
Chloride: 105 mmol/L (ref 98–111)
Creatinine, Ser: 0.85 mg/dL (ref 0.44–1.00)
GFR calc Af Amer: >60 mL/min (ref >60)
GFR calc non Af Amer: 60 mL/min — ABNORMAL LOW (ref >60)
Glucose, Bld: 124 mg/dL — ABNORMAL HIGH (ref 70–99)
Potassium: 4.3 mmol/L (ref 3.5–5.1)
Sodium: 141 mmol/L (ref 135–145)
Total Bilirubin: 0.8 mg/dL (ref 0.3–1.2)
Total Protein: 6.3 g/dL — ABNORMAL LOW (ref 6.5–8.1)

## 2019-11-20 LAB — TSH: TSH: 3.021 u[IU]/mL (ref 0.350–4.500)

## 2019-11-20 NOTE — Progress Notes (Signed)
Primary Care Physician: Lajean Manes, MD Referring Physician: Dr. Felipa Eth EP: Dr. Dahlia Bailiff Stephanie Ritter is a 86 y.o. female with a h/o paroxysmal afib with conversion to SR with amiodarone, which did not require a cardioversion. She is s/p hernia repair 08/07/18 and has recovered nicely. She did note one episode of bradycardia associated with some lightheadedness after her surgery. Denies syncope or falls.  F/u in afib clinic, 7/1. She has had her  meds adjusted and wore a monitor for her daughter reporting that times her reporting that she didn't feel good. Her monitor did not show any afib or significant brady/pauses. She did have some junctional rhythm, which she triggered but also triggered some SR. Today in the office, she does not report any significant issues, no weak or lightheaded spells. No falls. She thinks she is just bored and depressed for having to stay in so much with covid.   F/u in afib clinic, 05/15/19. She feels at her baseline. Feels alittle  fatigue at times but no unusual dizziness. She is in SR today.   F/u in afib clinic, 11/20/19. She  is in SR and feels well. The daughter states that she has had a good 6 months. She has had her covid shots. No bleeding  Issues with eliquis, CHA2DS2VASc of 5.   Today, she denies symptoms of palpitations, chest pain, shortness of breath, orthopnea, PND, lower extremity edema, presyncope, syncope, or neurologic sequela.  The patient is tolerating medications without difficulties and is otherwise without complaint today.   Past Medical History:  Diagnosis Date  . CHF (congestive heart failure) ([place])   . Hypertension   . Persistent atrial fibrillation ([place])   . Tinnitus    Past Surgical History:  Procedure Laterality Date  . APPENDECTOMY     60 years ago  . ATRIAL FIBRILLATION ABLATION N/A    8 years ago  . BOWEL RESECTION N/A 08/07/2018   Procedure: SMALL BOWEL RESECTION;  Surgeon: Clovis Riley, MD;  Location: [facility];  Service: General;  Laterality: N/A;  . hip replacement Bilateral    4 and 8 years ago  . INGUINAL HERNIA REPAIR Right 08/07/2018   Procedure: Laparoscopic right femoral hernia repair with mesh, possible open, possible bowel resection;  Surgeon: Clovis Riley, MD;  Location: [facility];  Service: General;  Laterality: Right;    Current Outpatient Medications  Medication Sig Dispense Refill  . acetaminophen (TYLENOL) 325 MG tablet Take 2 tablets (650 mg total) by mouth every 6 (six) hours. (Patient taking differently: Take 650 mg by mouth as needed. )    . amiodarone (PACERONE) 200 MG tablet TAKE ONE-HALF TABLET BY  MOUTH DAILY 45 tablet 3  . Carboxymethylcellulose Sodium (EYE DROPS OP) Place 2 drops into both eyes as needed (for dry eyes).     Marland Kitchen diltiazem (CARDIZEM CD) 180 MG 24 hr capsule TAKE 1 CAPSULE BY MOUTH  DAILY 90 capsule 3  . ELIQUIS 5 MG TABS tablet TAKE 1 TABLET BY MOUTH  TWICE DAILY 180 tablet 3  . escitalopram (LEXAPRO) 20 MG tablet Take 20 mg by mouth daily.   11  . ipratropium (ATROVENT) 0.03 % nasal spray Place 1 spray into both nostrils as needed.     . Multiple Vitamins-Minerals (PRESERVISION/LUTEIN) CAPS Take 1 capsule by mouth 2 (two) times daily.     Marland Kitchen MYRBETRIQ 25 MG TB24 tablet 1 tablet daily.     No current facility-administered medications for this encounter.  Allergies  Allergen Reactions  . Asa [Aspirin] Other (See Comments)    Excessive bleeding  . Nsaids Other (See Comments)    Bleeding, ulcers    Social History   Socioeconomic History  . Marital status: Married    Spouse name: Not on file  . Number of children: Not on file  . Years of education: Not on file  . Highest education level: Not on file  Occupational History  . Not on file  Tobacco Use  . Smoking status: Former Smoker    Types: Cigarettes  . Smokeless tobacco: Never Used  . Tobacco comment: Pt quit 50+ years ago  Substance and Sexual Activity  . Alcohol use: Yes     Alcohol/week: 1.0 standard drinks    Types: 1 Glasses of wine per week    Comment: occasionally  . Drug use: No  . Sexual activity: Not on file  Other Topics Concern  . Not on file  Social History Narrative  . Not on file   Social Determinants of Health   Financial Resource Strain:   . Difficulty of Paying Living Expenses:   Food Insecurity:   . Worried About Charity fundraiser in the Last Year:   . Arboriculturist in the Last Year:   Transportation Needs:   . Film/video editor (Medical):   Marland Kitchen Lack of Transportation (Non-Medical):   Physical Activity:   . Days of Exercise per Week:   . Minutes of Exercise per Session:   Stress:   . Feeling of Stress :   Social Connections:   . Frequency of Communication with Friends and Family:   . Frequency of Social Gatherings with Friends and Family:   . Attends Religious Services:   . Active Member of Clubs or Organizations:   . Attends Archivist Meetings:   Marland Kitchen Marital Status:   Intimate Partner Violence:   . Fear of Current or Ex-Partner:   . Emotionally Abused:   Marland Kitchen Physically Abused:   . Sexually Abused:     Family History  Family history unknown: Yes    ROS- All systems are reviewed and negative except as per the HPI above  Physical Exam: Vitals:   11/20/19 1520  BP: 140/60  Pulse: 71  Weight: 76 kg  Height: 5\' 4"  (1.626 m)   Wt Readings from Last 3 Encounters:  11/20/19 76 kg  05/15/19 72.4 kg  01/29/19 71.2 kg    Labs: Lab Results  Component Value Date   NA 138 01/29/2019   K 4.5 01/29/2019   CL 103 01/29/2019   CO2 27 01/29/2019   GLUCOSE 90 01/29/2019   BUN 24 (H) 01/29/2019   CREATININE 1.25 (H) 01/29/2019   CALCIUM 8.6 (L) 01/29/2019   No results found for: INR No results found for: CHOL, HDL, LDLCALC, TRIG   GEN- The patient is well appearing elderly female, alert and oriented x 3 today.   HEENT-head normocephalic, atraumatic, sclera clear, conjunctiva pink, hearing intact,  trachea midline. Lungs- Clear to ausculation bilaterally, normal work of breathing Heart- Regular rate and rhythm, no murmurs, rubs or gallops  GI- soft, NT, ND, + BS Extremities- no clubbing, cyanosis, or edema MS- no significant deformity or atrophy Skin- no rash or lesion Psych- euthymic mood, full affect Neuro- strength and sensation are intact   EKG- Sinus rhythm  with first degree block, LAFB, v rate 71  bpm, qrs int 104 ms, qtc 452 ms   Assessment and Plan:  1. Persistent Afib/flutter Continues in SR. Feels well  Continue   Amiodarone 100 mg daily. Continue at reduced  diltiazem dose 180 mg daily Continue Eliquis 5 mg BID, CHA2DS2VASc score of 5 Cmet/tsh today   2. HTN Stable, no changes today.  F/u in 6 months  [facility] [address] [place], [place] [address] [phone]

## 2019-12-21 IMAGING — CR DG ABDOMEN ACUTE W/ 1V CHEST
3 series · 3 of 3 positions shown · non-contrast
Comparison: 01/22/2017.

CLINICAL DATA: Abdominal distension and pain for several days

EXAM:
DG ABDOMEN ACUTE W/ 1V CHEST

[w chest pa]
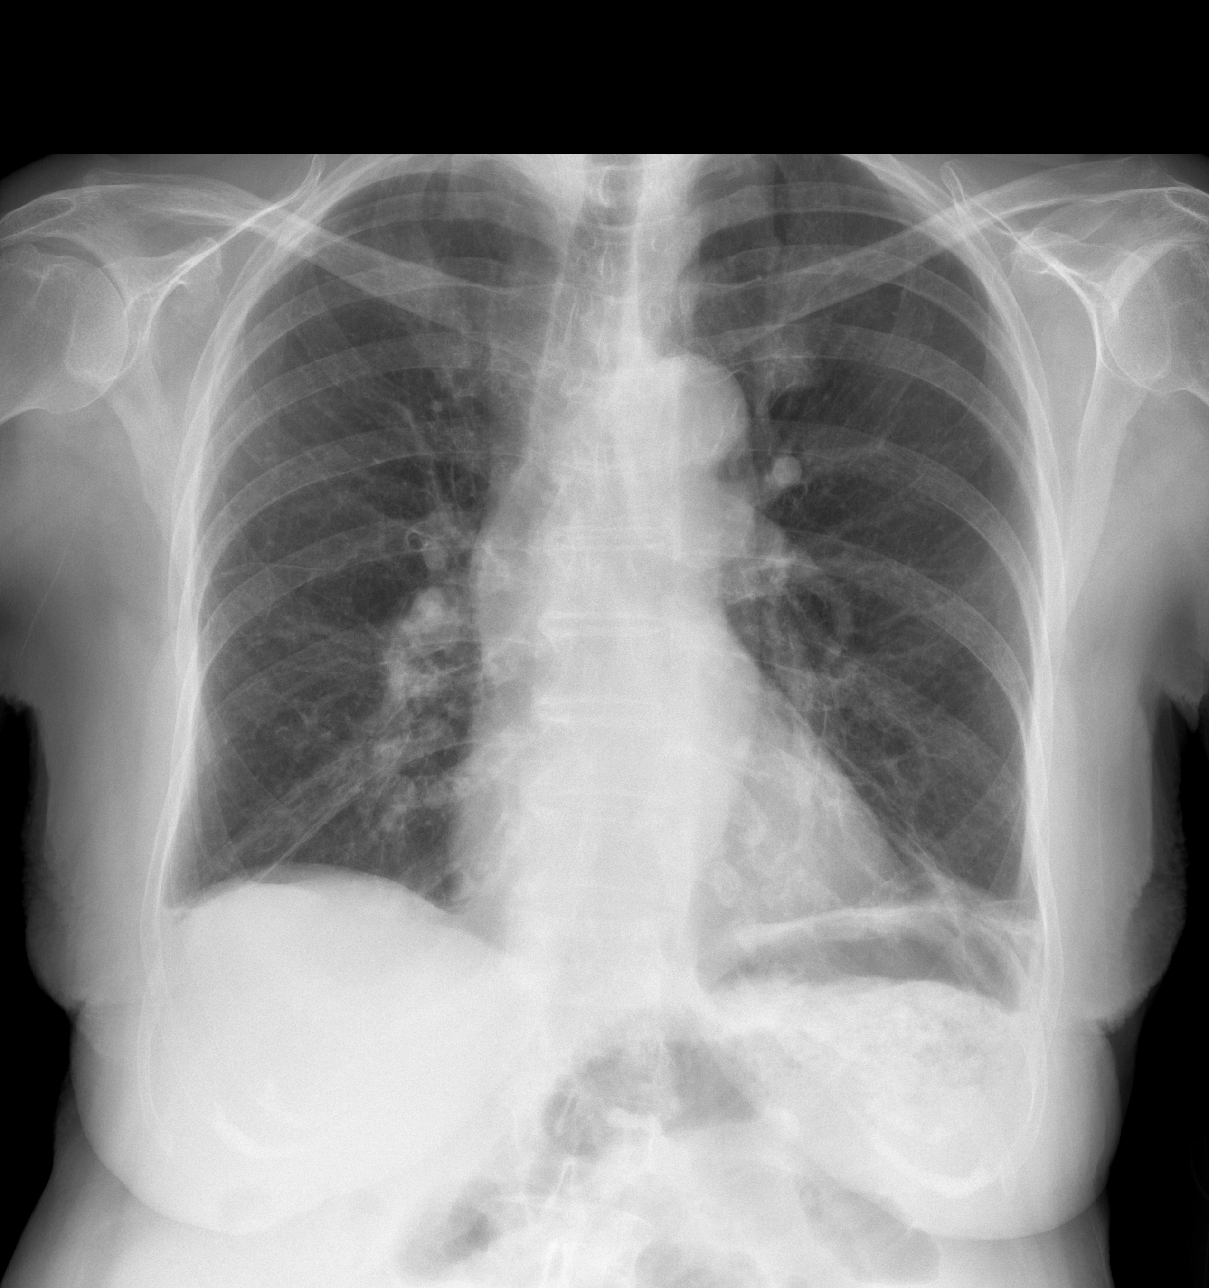

[w abdomen upright *]
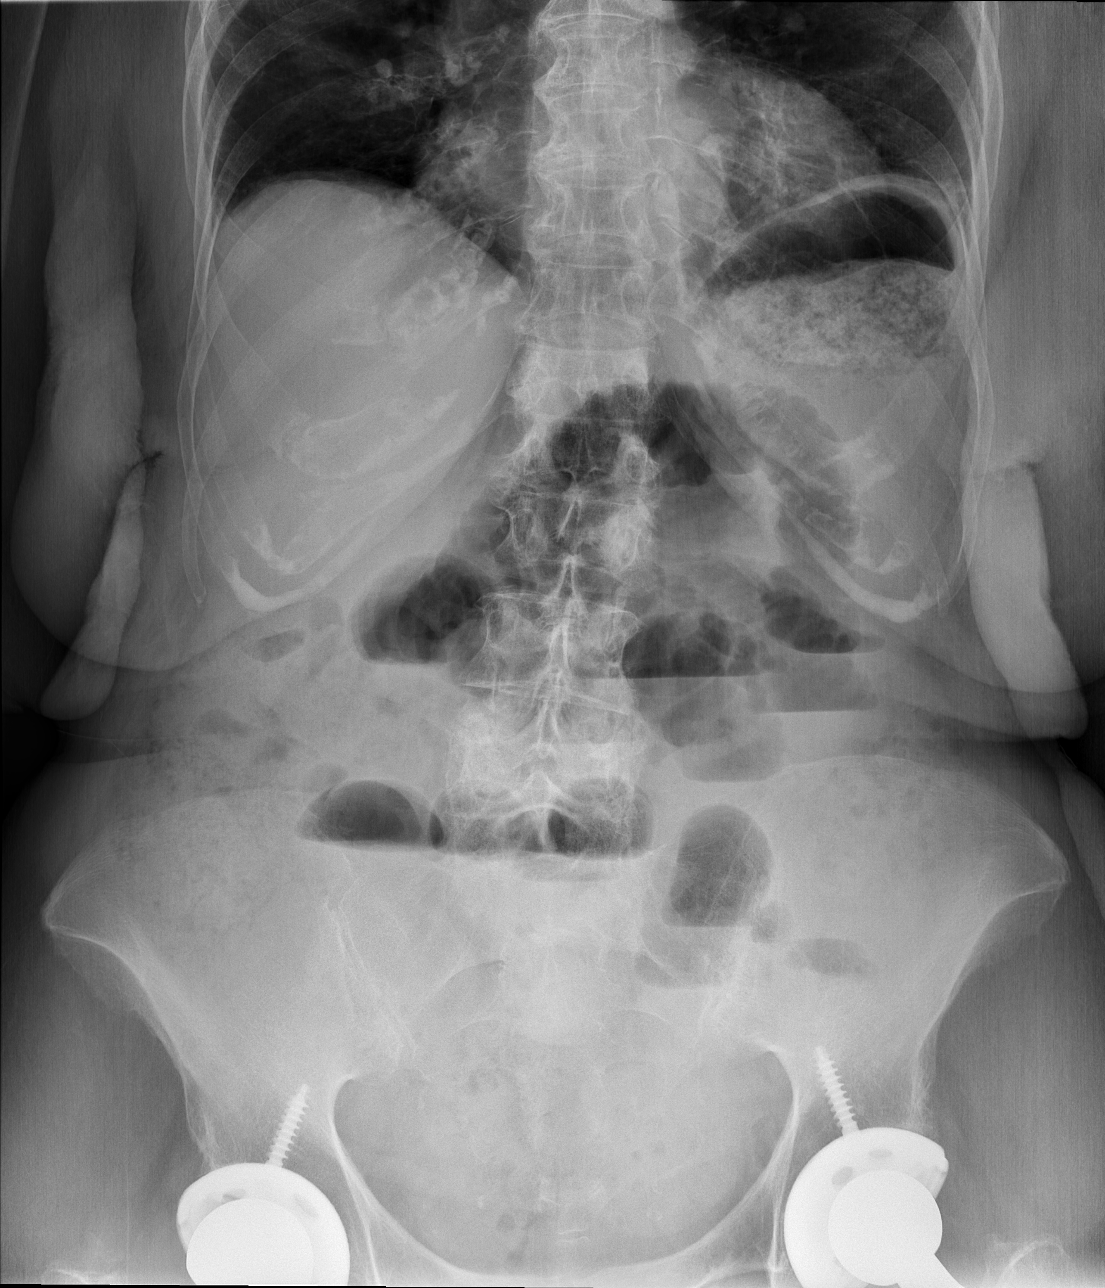

[t abdomen supine]
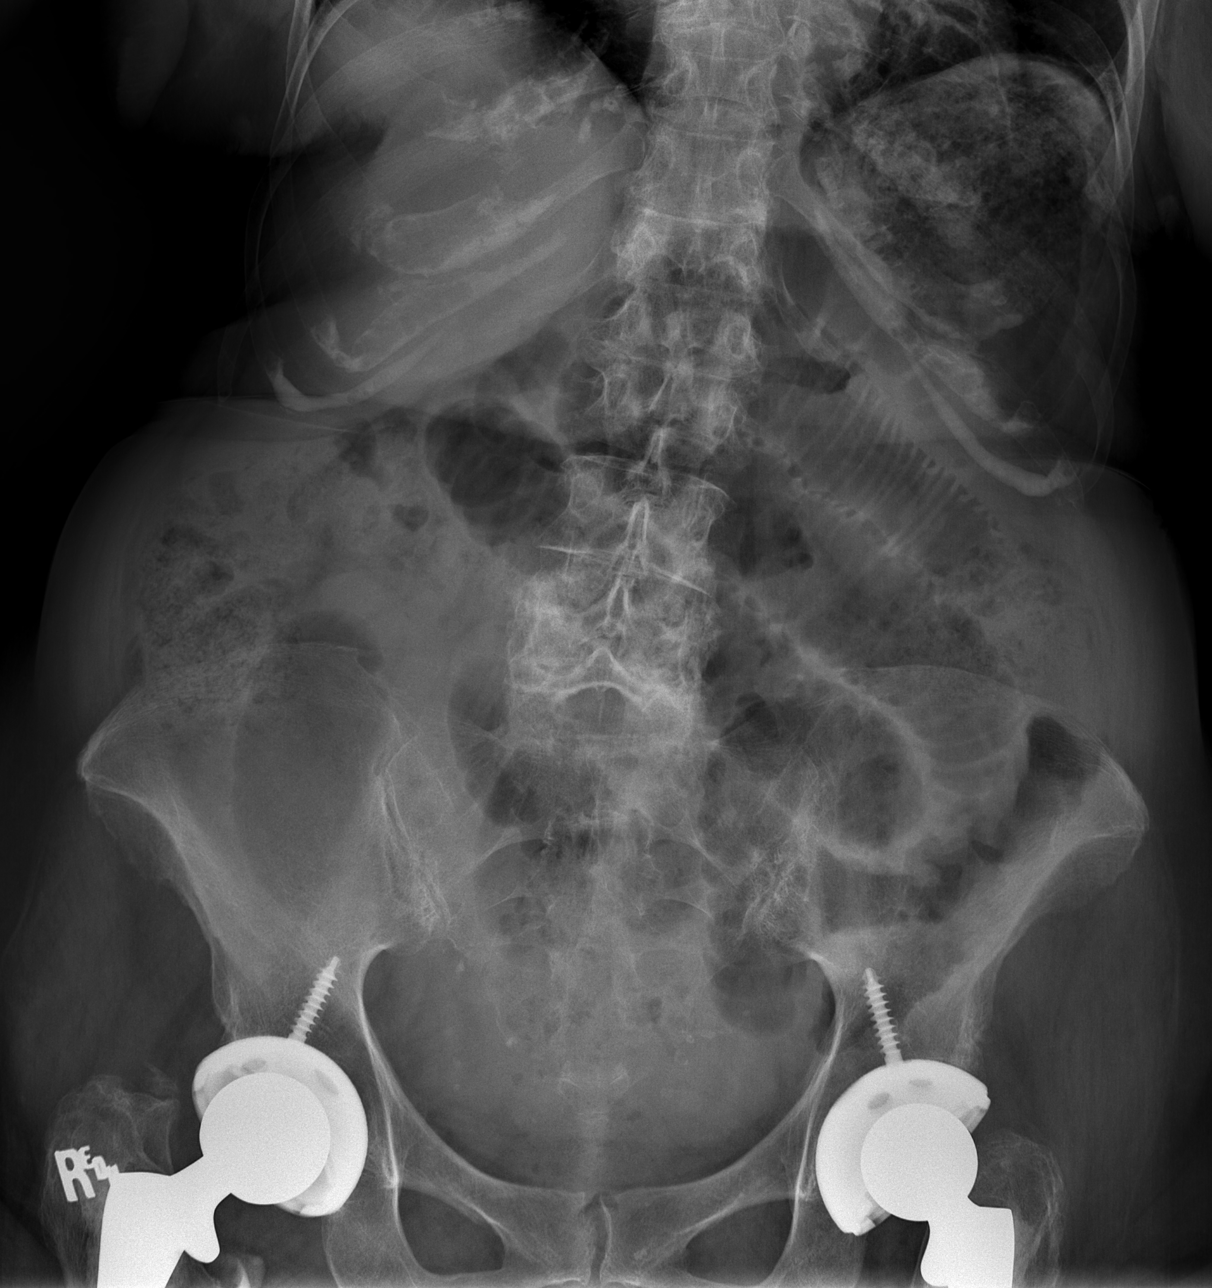

[3 of 3 positions shown; findings below may reference images not displayed]

FINDINGS: Cardiac shadows within normal limits. The lungs are well aerated
bilaterally. No focal infiltrate is seen. Stable bibasilar scarring
is noted.

Multiple dilated loops of small bowel are noted with differential
air-fluid levels consistent with small bowel obstruction. Fecal
material is noted throughout the colon. Bilateral hip replacements
are seen. No free air is noted. Degenerative changes of lumbar spine
are seen.
IMPRESSION: Changes consistent with at least partial small bowel obstruction
without evidence of perforation. CT is recommended for further
evaluation.

These results will be called to the ordering clinician or
representative by the Radiologist Assistant, and communication
documented in the PACS or zVision Dashboard.

## 2020-01-15 ENCOUNTER — Other Ambulatory Visit (HOSPITAL_COMMUNITY): Payer: Self-pay

## 2020-01-15 MED ORDER — DILTIAZEM HCL ER COATED BEADS 180 MG PO CP24: 180.0000 mg | ORAL_CAPSULE | Freq: Every day | ORAL | 3 refills | Status: DC

## 2020-02-20 ENCOUNTER — Telehealth (HOSPITAL_COMMUNITY): Payer: Self-pay

## 2020-02-20 NOTE — Telephone Encounter (Signed)
Patient's daughter called and states her mom has not been feeling well since Tuesday and she is very tired. Her blood pressure has been 167/76 and 156/79. Her mom has more than likely not been drinking enough water. He drinks 3 diet cokes daily. Per Roderic Palau- NP she could possibly have a UTI with her previous history of urinary tract infections and she may be dehydrated from not drinking enough water. Her daughter was advised to obtain a urine sample to have her urine checked and to make sure she is drinking lots of water throughout the weekend. Also, she is aware to continue checking her blood pressure and heart rate as well this weekend. She was told to contact the A-fib clinic back on Monday to let us know how she is feeling. Consulted with patients daughter and she verbalized understanding.

## 2020-03-29 DIAGNOSIS — R5383 Other fatigue: Secondary | ICD-10-CM | POA: Diagnosis not present

## 2020-03-29 DIAGNOSIS — Z79899 Other long term (current) drug therapy: Secondary | ICD-10-CM | POA: Diagnosis not present

## 2020-03-29 DIAGNOSIS — G3184 Mild cognitive impairment, so stated: Secondary | ICD-10-CM | POA: Diagnosis not present

## 2020-03-29 DIAGNOSIS — N1832 Chronic kidney disease, stage 3b: Secondary | ICD-10-CM | POA: Diagnosis not present

## 2020-03-29 DIAGNOSIS — I480 Paroxysmal atrial fibrillation: Secondary | ICD-10-CM | POA: Diagnosis not present

## 2020-03-29 DIAGNOSIS — D6869 Other thrombophilia: Secondary | ICD-10-CM | POA: Diagnosis not present

## 2020-03-29 DIAGNOSIS — G4730 Sleep apnea, unspecified: Secondary | ICD-10-CM | POA: Diagnosis not present

## 2020-05-13 ENCOUNTER — Other Ambulatory Visit (HOSPITAL_COMMUNITY): Payer: Self-pay | Admitting: Nurse Practitioner

## 2020-05-20 ENCOUNTER — Ambulatory Visit (HOSPITAL_COMMUNITY): Payer: Medicare Other | Admitting: Nurse Practitioner

## 2020-05-25 ENCOUNTER — Other Ambulatory Visit: Payer: Self-pay

## 2020-05-25 ENCOUNTER — Encounter (HOSPITAL_COMMUNITY): Payer: Self-pay | Admitting: Nurse Practitioner

## 2020-05-25 ENCOUNTER — Ambulatory Visit (HOSPITAL_COMMUNITY)
Admission: RE | Admit: 2020-05-25 | Discharge: 2020-05-25 | Disposition: A | Discharge status: Home / Self Care | Payer: Medicare Other | Source: Ambulatory Visit | Attending: Nurse Practitioner | Admitting: Nurse Practitioner

## 2020-05-25 VITALS — BP 120/60 | HR 72 | Ht 64.0 in | Wt 169.0 lb

## 2020-05-25 DIAGNOSIS — I10 Essential (primary) hypertension: Secondary | ICD-10-CM | POA: Diagnosis not present

## 2020-05-25 DIAGNOSIS — D6869 Other thrombophilia: Secondary | ICD-10-CM

## 2020-05-25 DIAGNOSIS — I4819 Other persistent atrial fibrillation: Secondary | ICD-10-CM | POA: Diagnosis not present

## 2020-05-25 DIAGNOSIS — Z87891 Personal history of nicotine dependence: Secondary | ICD-10-CM | POA: Diagnosis not present

## 2020-05-25 DIAGNOSIS — Z7901 Long term (current) use of anticoagulants: Secondary | ICD-10-CM | POA: Diagnosis not present

## 2020-05-25 DIAGNOSIS — I4892 Unspecified atrial flutter: Secondary | ICD-10-CM | POA: Insufficient documentation

## 2020-05-25 NOTE — Progress Notes (Signed)
Primary Care Physician: Lajean Manes, MD Referring Physician: Dr. Felipa Eth EP: Dr. Dahlia Bailiff Stephanie Ritter is a 86 y.o. female with a h/o paroxysmal afib with conversion to SR with amiodarone, which did not require a cardioversion. She is s/p hernia repair 08/07/18 and has recovered nicely. She did note one episode of bradycardia associated with some lightheadedness after her surgery. Denies syncope or falls.  F/u in afib clinic, 7/1. She has had her  meds adjusted and wore a monitor for her daughter reporting that times her reporting that she didn't feel good. Her monitor did not show any afib or significant brady/pauses. She did have some junctional rhythm, which she triggered but also triggered some SR. Today in the office, she does not report any significant issues, no weak or lightheaded spells. No falls. She thinks she is just bored and depressed for having to stay in so much with covid.   F/u in afib clinic, 05/15/19. She feels at her baseline. Feels alittle  fatigue at times but no unusual dizziness. She is in SR today.   F/u in afib clinic, 11/20/19. She  is in SR and feels well. The daughter states that she has had a good 6 months. She has had her covid shots. No bleeding  Issues with eliquis, CHA2DS2VASc of 5.   F/u in afib clinic, 05/25/20. She is in the afib clinic for f/u. She remains in SR. She has had an uneventful 6 months. Continues  on amiodarone 100 mg daily and eliquis  5 mg bid. Her labs were last scheduled with Dr.Stoneking in August.  Will request for review.   Today, she denies symptoms of palpitations, chest pain, shortness of breath, orthopnea, PND, lower extremity edema, presyncope, syncope, or neurologic sequela.  The patient is tolerating medications without difficulties and is otherwise without complaint today.   Past Medical History:  Diagnosis Date  . CHF (congestive heart failure) ([facility])   . Hypertension   . Persistent atrial fibrillation ([facility])   .  Tinnitus    Past Surgical History:  Procedure Laterality Date  . APPENDECTOMY     60 years ago  . ATRIAL FIBRILLATION ABLATION N/A    8 years ago  . BOWEL RESECTION N/A 08/07/2018   Procedure: SMALL BOWEL RESECTION;  Surgeon: Clovis Riley, MD;  Location: [facility];  Service: General;  Laterality: N/A;  . hip replacement Bilateral    4 and 8 years ago  . INGUINAL HERNIA REPAIR Right 08/07/2018   Procedure: Laparoscopic right femoral hernia repair with mesh, possible open, possible bowel resection;  Surgeon: Clovis Riley, MD;  Location: [facility];  Service: General;  Laterality: Right;    Current Outpatient Medications  Medication Sig Dispense Refill  . acetaminophen (TYLENOL) 325 MG tablet Take 2 tablets (650 mg total) by mouth every 6 (six) hours. (Patient taking differently: Take 650 mg by mouth as needed. )    . amiodarone (PACERONE) 200 MG tablet TAKE ONE-HALF TABLET BY  MOUTH DAILY 45 tablet 3  . Carboxymethylcellulose Sodium (EYE DROPS OP) Place 2 drops into both eyes as needed (for dry eyes).     Marland Kitchen diltiazem (CARDIZEM CD) 180 MG 24 hr capsule Take 1 capsule (180 mg total) by mouth daily. 90 capsule 3  . ELIQUIS 5 MG TABS tablet TAKE 1 TABLET BY MOUTH  TWICE DAILY 180 tablet 3  . escitalopram (LEXAPRO) 20 MG tablet Take 20 mg by mouth daily.   11  . ipratropium (ATROVENT) 0.03 %  nasal spray Place 1 spray into both nostrils as needed.     . Multiple Vitamins-Minerals (PRESERVISION/LUTEIN) CAPS Take 1 capsule by mouth 2 (two) times daily.     Marland Kitchen MYRBETRIQ 25 MG TB24 tablet 1 tablet daily.     No current facility-administered medications for this encounter.    Allergies  Allergen Reactions  . Asa [Aspirin] Other (See Comments)    Excessive bleeding  . Nsaids Other (See Comments)    Bleeding, ulcers    Social History   Socioeconomic History  . Marital status: Married    Spouse name: Not on file  . Number of children: Not on file  . Years of education: Not on file  .  Highest education level: Not on file  Occupational History  . Not on file  Tobacco Use  . Smoking status: Former Smoker    Types: Cigarettes  . Smokeless tobacco: Never Used  . Tobacco comment: Pt quit 50+ years ago  Vaping Use  . Vaping Use: Never used  Substance and Sexual Activity  . Alcohol use: Yes    Alcohol/week: 1.0 standard drink    Types: 1 Glasses of wine per week    Comment: occasionally  . Drug use: No  . Sexual activity: Not on file  Other Topics Concern  . Not on file  Social History Narrative  . Not on file   Social Determinants of Health   Financial Resource Strain:   . Difficulty of Paying Living Expenses: Not on file  Food Insecurity:   . Worried About Charity fundraiser in the Last Year: Not on file  . Ran Out of Food in the Last Year: Not on file  Transportation Needs:   . Lack of Transportation (Medical): Not on file  . Lack of Transportation (Non-Medical): Not on file  Physical Activity:   . Days of Exercise per Week: Not on file  . Minutes of Exercise per Session: Not on file  Stress:   . Feeling of Stress : Not on file  Social Connections:   . Frequency of Communication with Friends and Family: Not on file  . Frequency of Social Gatherings with Friends and Family: Not on file  . Attends Religious Services: Not on file  . Active Member of Clubs or Organizations: Not on file  . Attends Archivist Meetings: Not on file  . Marital Status: Not on file  Intimate Partner Violence:   . Fear of Current or Ex-Partner: Not on file  . Emotionally Abused: Not on file  . Physically Abused: Not on file  . Sexually Abused: Not on file    Family History  Family history unknown: Yes    ROS- All systems are reviewed and negative except as per the HPI above  Physical Exam: Vitals:   05/25/20 1534  BP: 120/60  Pulse: 72  Weight: 76.7 kg  Height: 5\' 4"  (1.626 m)   Wt Readings from Last 3 Encounters:  05/25/20 76.7 kg  11/20/19 76 kg   05/15/19 72.4 kg    Labs: Lab Results  Component Value Date   NA 141 11/20/2019   K 4.3 11/20/2019   CL 105 11/20/2019   CO2 27 11/20/2019   GLUCOSE 124 (H) 11/20/2019   BUN 19 11/20/2019   CREATININE 0.85 11/20/2019   CALCIUM 8.7 (L) 11/20/2019   No results found for: INR No results found for: CHOL, HDL, LDLCALC, TRIG   GEN- The patient is well appearing elderly female, alert and  oriented x 3 today.   HEENT-head normocephalic, atraumatic, sclera clear, conjunctiva pink, hearing intact, trachea midline. Lungs- Clear to ausculation bilaterally, normal work of breathing Heart- Regular rate and rhythm, no murmurs, rubs or gallops  GI- soft, NT, ND, + BS Extremities- no clubbing, cyanosis, or edema MS- no significant deformity or atrophy Skin- no rash or lesion Psych- euthymic mood, full affect Neuro- strength and sensation are intact   EKG- Sinus rhythm at 72 bpm, pr int 206 ms, qrs int 104 ms, qtc 446 ms   Assessment and Plan: 1. Persistent Afib/flutter Continues in SR Feels well  Continue  Amiodarone 100 mg daily. Continue  diltiazem dose 180 mg daily Continue Eliquis 5 mg BID, CHA2DS2VASc score of 5 Cmet/tsh drawn by Dr, Felipa Eth in August and have requested labs for review  2. HTN Stable, no changes today  F/u in 6 months  Butch Penny C. Jovanni Rash, [facility] [address] [place], [place] [address] [phone]

## 2020-05-27 DIAGNOSIS — I480 Paroxysmal atrial fibrillation: Secondary | ICD-10-CM | POA: Diagnosis not present

## 2020-05-27 DIAGNOSIS — I129 Hypertensive chronic kidney disease with stage 1 through stage 4 chronic kidney disease, or unspecified chronic kidney disease: Secondary | ICD-10-CM | POA: Diagnosis not present

## 2020-06-03 DIAGNOSIS — G8929 Other chronic pain: Secondary | ICD-10-CM | POA: Diagnosis not present

## 2020-06-03 DIAGNOSIS — N1832 Chronic kidney disease, stage 3b: Secondary | ICD-10-CM | POA: Diagnosis not present

## 2020-06-03 DIAGNOSIS — I129 Hypertensive chronic kidney disease with stage 1 through stage 4 chronic kidney disease, or unspecified chronic kidney disease: Secondary | ICD-10-CM | POA: Diagnosis not present

## 2020-06-03 DIAGNOSIS — I480 Paroxysmal atrial fibrillation: Secondary | ICD-10-CM | POA: Diagnosis not present

## 2020-07-29 DIAGNOSIS — N1832 Chronic kidney disease, stage 3b: Secondary | ICD-10-CM | POA: Diagnosis not present

## 2020-07-29 DIAGNOSIS — I129 Hypertensive chronic kidney disease with stage 1 through stage 4 chronic kidney disease, or unspecified chronic kidney disease: Secondary | ICD-10-CM | POA: Diagnosis not present

## 2020-07-29 DIAGNOSIS — I480 Paroxysmal atrial fibrillation: Secondary | ICD-10-CM | POA: Diagnosis not present

## 2020-07-29 DIAGNOSIS — G8929 Other chronic pain: Secondary | ICD-10-CM | POA: Diagnosis not present

## 2020-08-13 DIAGNOSIS — N1832 Chronic kidney disease, stage 3b: Secondary | ICD-10-CM | POA: Diagnosis not present

## 2020-08-13 DIAGNOSIS — R269 Unspecified abnormalities of gait and mobility: Secondary | ICD-10-CM | POA: Diagnosis not present

## 2020-08-13 DIAGNOSIS — I480 Paroxysmal atrial fibrillation: Secondary | ICD-10-CM | POA: Diagnosis not present

## 2020-08-13 DIAGNOSIS — Z23 Encounter for immunization: Secondary | ICD-10-CM | POA: Diagnosis not present

## 2020-08-13 DIAGNOSIS — R413 Other amnesia: Secondary | ICD-10-CM | POA: Diagnosis not present

## 2020-08-13 DIAGNOSIS — I129 Hypertensive chronic kidney disease with stage 1 through stage 4 chronic kidney disease, or unspecified chronic kidney disease: Secondary | ICD-10-CM | POA: Diagnosis not present

## 2020-08-13 DIAGNOSIS — I700 Atherosclerosis of aorta: Secondary | ICD-10-CM | POA: Diagnosis not present

## 2020-08-26 DIAGNOSIS — G4733 Obstructive sleep apnea (adult) (pediatric): Secondary | ICD-10-CM | POA: Diagnosis not present

## 2020-08-26 DIAGNOSIS — I480 Paroxysmal atrial fibrillation: Secondary | ICD-10-CM | POA: Diagnosis not present

## 2020-08-26 DIAGNOSIS — I509 Heart failure, unspecified: Secondary | ICD-10-CM | POA: Diagnosis not present

## 2020-08-26 DIAGNOSIS — R413 Other amnesia: Secondary | ICD-10-CM | POA: Diagnosis not present

## 2020-08-26 DIAGNOSIS — I130 Hypertensive heart and chronic kidney disease with heart failure and stage 1 through stage 4 chronic kidney disease, or unspecified chronic kidney disease: Secondary | ICD-10-CM | POA: Diagnosis not present

## 2020-08-26 DIAGNOSIS — Z9181 History of falling: Secondary | ICD-10-CM | POA: Diagnosis not present

## 2020-08-26 DIAGNOSIS — I700 Atherosclerosis of aorta: Secondary | ICD-10-CM | POA: Diagnosis not present

## 2020-08-26 DIAGNOSIS — N1832 Chronic kidney disease, stage 3b: Secondary | ICD-10-CM | POA: Diagnosis not present

## 2020-08-26 DIAGNOSIS — K439 Ventral hernia without obstruction or gangrene: Secondary | ICD-10-CM | POA: Diagnosis not present

## 2020-08-26 DIAGNOSIS — K5790 Diverticulosis of intestine, part unspecified, without perforation or abscess without bleeding: Secondary | ICD-10-CM | POA: Diagnosis not present

## 2020-08-26 DIAGNOSIS — E785 Hyperlipidemia, unspecified: Secondary | ICD-10-CM | POA: Diagnosis not present

## 2020-08-26 DIAGNOSIS — Z7901 Long term (current) use of anticoagulants: Secondary | ICD-10-CM | POA: Diagnosis not present

## 2020-08-26 DIAGNOSIS — H9193 Unspecified hearing loss, bilateral: Secondary | ICD-10-CM | POA: Diagnosis not present

## 2020-09-08 ENCOUNTER — Other Ambulatory Visit (HOSPITAL_COMMUNITY): Payer: Self-pay | Admitting: Nurse Practitioner

## 2020-09-10 DIAGNOSIS — K5790 Diverticulosis of intestine, part unspecified, without perforation or abscess without bleeding: Secondary | ICD-10-CM | POA: Diagnosis not present

## 2020-09-10 DIAGNOSIS — I509 Heart failure, unspecified: Secondary | ICD-10-CM | POA: Diagnosis not present

## 2020-09-10 DIAGNOSIS — I130 Hypertensive heart and chronic kidney disease with heart failure and stage 1 through stage 4 chronic kidney disease, or unspecified chronic kidney disease: Secondary | ICD-10-CM | POA: Diagnosis not present

## 2020-09-10 DIAGNOSIS — I700 Atherosclerosis of aorta: Secondary | ICD-10-CM | POA: Diagnosis not present

## 2020-09-10 DIAGNOSIS — I480 Paroxysmal atrial fibrillation: Secondary | ICD-10-CM | POA: Diagnosis not present

## 2020-09-10 DIAGNOSIS — N1832 Chronic kidney disease, stage 3b: Secondary | ICD-10-CM | POA: Diagnosis not present

## 2020-09-22 DIAGNOSIS — N1832 Chronic kidney disease, stage 3b: Secondary | ICD-10-CM | POA: Diagnosis not present

## 2020-09-22 DIAGNOSIS — K5790 Diverticulosis of intestine, part unspecified, without perforation or abscess without bleeding: Secondary | ICD-10-CM | POA: Diagnosis not present

## 2020-09-22 DIAGNOSIS — I480 Paroxysmal atrial fibrillation: Secondary | ICD-10-CM | POA: Diagnosis not present

## 2020-09-22 DIAGNOSIS — I130 Hypertensive heart and chronic kidney disease with heart failure and stage 1 through stage 4 chronic kidney disease, or unspecified chronic kidney disease: Secondary | ICD-10-CM | POA: Diagnosis not present

## 2020-09-22 DIAGNOSIS — I700 Atherosclerosis of aorta: Secondary | ICD-10-CM | POA: Diagnosis not present

## 2020-09-22 DIAGNOSIS — I509 Heart failure, unspecified: Secondary | ICD-10-CM | POA: Diagnosis not present

## 2020-09-23 DIAGNOSIS — N1832 Chronic kidney disease, stage 3b: Secondary | ICD-10-CM | POA: Diagnosis not present

## 2020-09-23 DIAGNOSIS — G8929 Other chronic pain: Secondary | ICD-10-CM | POA: Diagnosis not present

## 2020-09-23 DIAGNOSIS — I129 Hypertensive chronic kidney disease with stage 1 through stage 4 chronic kidney disease, or unspecified chronic kidney disease: Secondary | ICD-10-CM | POA: Diagnosis not present

## 2020-09-23 DIAGNOSIS — I480 Paroxysmal atrial fibrillation: Secondary | ICD-10-CM | POA: Diagnosis not present

## 2020-09-24 DIAGNOSIS — I480 Paroxysmal atrial fibrillation: Secondary | ICD-10-CM | POA: Diagnosis not present

## 2020-09-24 DIAGNOSIS — I509 Heart failure, unspecified: Secondary | ICD-10-CM | POA: Diagnosis not present

## 2020-09-24 DIAGNOSIS — N1832 Chronic kidney disease, stage 3b: Secondary | ICD-10-CM | POA: Diagnosis not present

## 2020-09-24 DIAGNOSIS — I700 Atherosclerosis of aorta: Secondary | ICD-10-CM | POA: Diagnosis not present

## 2020-09-24 DIAGNOSIS — K5790 Diverticulosis of intestine, part unspecified, without perforation or abscess without bleeding: Secondary | ICD-10-CM | POA: Diagnosis not present

## 2020-09-24 DIAGNOSIS — I130 Hypertensive heart and chronic kidney disease with heart failure and stage 1 through stage 4 chronic kidney disease, or unspecified chronic kidney disease: Secondary | ICD-10-CM | POA: Diagnosis not present

## 2020-09-25 DIAGNOSIS — I700 Atherosclerosis of aorta: Secondary | ICD-10-CM | POA: Diagnosis not present

## 2020-09-25 DIAGNOSIS — Z9181 History of falling: Secondary | ICD-10-CM | POA: Diagnosis not present

## 2020-09-25 DIAGNOSIS — N1832 Chronic kidney disease, stage 3b: Secondary | ICD-10-CM | POA: Diagnosis not present

## 2020-09-25 DIAGNOSIS — K5790 Diverticulosis of intestine, part unspecified, without perforation or abscess without bleeding: Secondary | ICD-10-CM | POA: Diagnosis not present

## 2020-09-25 DIAGNOSIS — I509 Heart failure, unspecified: Secondary | ICD-10-CM | POA: Diagnosis not present

## 2020-09-25 DIAGNOSIS — H9193 Unspecified hearing loss, bilateral: Secondary | ICD-10-CM | POA: Diagnosis not present

## 2020-09-25 DIAGNOSIS — E785 Hyperlipidemia, unspecified: Secondary | ICD-10-CM | POA: Diagnosis not present

## 2020-09-25 DIAGNOSIS — Z7901 Long term (current) use of anticoagulants: Secondary | ICD-10-CM | POA: Diagnosis not present

## 2020-09-25 DIAGNOSIS — I130 Hypertensive heart and chronic kidney disease with heart failure and stage 1 through stage 4 chronic kidney disease, or unspecified chronic kidney disease: Secondary | ICD-10-CM | POA: Diagnosis not present

## 2020-09-25 DIAGNOSIS — K439 Ventral hernia without obstruction or gangrene: Secondary | ICD-10-CM | POA: Diagnosis not present

## 2020-09-25 DIAGNOSIS — R413 Other amnesia: Secondary | ICD-10-CM | POA: Diagnosis not present

## 2020-09-25 DIAGNOSIS — I480 Paroxysmal atrial fibrillation: Secondary | ICD-10-CM | POA: Diagnosis not present

## 2020-09-25 DIAGNOSIS — G4733 Obstructive sleep apnea (adult) (pediatric): Secondary | ICD-10-CM | POA: Diagnosis not present

## 2020-09-27 DIAGNOSIS — I509 Heart failure, unspecified: Secondary | ICD-10-CM | POA: Diagnosis not present

## 2020-09-27 DIAGNOSIS — N1832 Chronic kidney disease, stage 3b: Secondary | ICD-10-CM | POA: Diagnosis not present

## 2020-09-27 DIAGNOSIS — I130 Hypertensive heart and chronic kidney disease with heart failure and stage 1 through stage 4 chronic kidney disease, or unspecified chronic kidney disease: Secondary | ICD-10-CM | POA: Diagnosis not present

## 2020-09-27 DIAGNOSIS — I480 Paroxysmal atrial fibrillation: Secondary | ICD-10-CM | POA: Diagnosis not present

## 2020-09-27 DIAGNOSIS — I700 Atherosclerosis of aorta: Secondary | ICD-10-CM | POA: Diagnosis not present

## 2020-09-27 DIAGNOSIS — K5790 Diverticulosis of intestine, part unspecified, without perforation or abscess without bleeding: Secondary | ICD-10-CM | POA: Diagnosis not present

## 2020-09-29 DIAGNOSIS — I509 Heart failure, unspecified: Secondary | ICD-10-CM | POA: Diagnosis not present

## 2020-09-29 DIAGNOSIS — N1832 Chronic kidney disease, stage 3b: Secondary | ICD-10-CM | POA: Diagnosis not present

## 2020-09-29 DIAGNOSIS — I130 Hypertensive heart and chronic kidney disease with heart failure and stage 1 through stage 4 chronic kidney disease, or unspecified chronic kidney disease: Secondary | ICD-10-CM | POA: Diagnosis not present

## 2020-09-29 DIAGNOSIS — I700 Atherosclerosis of aorta: Secondary | ICD-10-CM | POA: Diagnosis not present

## 2020-09-29 DIAGNOSIS — K5790 Diverticulosis of intestine, part unspecified, without perforation or abscess without bleeding: Secondary | ICD-10-CM | POA: Diagnosis not present

## 2020-09-29 DIAGNOSIS — I480 Paroxysmal atrial fibrillation: Secondary | ICD-10-CM | POA: Diagnosis not present

## 2020-10-06 DIAGNOSIS — K5790 Diverticulosis of intestine, part unspecified, without perforation or abscess without bleeding: Secondary | ICD-10-CM | POA: Diagnosis not present

## 2020-10-06 DIAGNOSIS — I509 Heart failure, unspecified: Secondary | ICD-10-CM | POA: Diagnosis not present

## 2020-10-06 DIAGNOSIS — N1832 Chronic kidney disease, stage 3b: Secondary | ICD-10-CM | POA: Diagnosis not present

## 2020-10-06 DIAGNOSIS — I480 Paroxysmal atrial fibrillation: Secondary | ICD-10-CM | POA: Diagnosis not present

## 2020-10-06 DIAGNOSIS — I700 Atherosclerosis of aorta: Secondary | ICD-10-CM | POA: Diagnosis not present

## 2020-10-06 DIAGNOSIS — I130 Hypertensive heart and chronic kidney disease with heart failure and stage 1 through stage 4 chronic kidney disease, or unspecified chronic kidney disease: Secondary | ICD-10-CM | POA: Diagnosis not present

## 2020-10-11 DIAGNOSIS — I509 Heart failure, unspecified: Secondary | ICD-10-CM | POA: Diagnosis not present

## 2020-10-11 DIAGNOSIS — N1832 Chronic kidney disease, stage 3b: Secondary | ICD-10-CM | POA: Diagnosis not present

## 2020-10-11 DIAGNOSIS — I700 Atherosclerosis of aorta: Secondary | ICD-10-CM | POA: Diagnosis not present

## 2020-10-11 DIAGNOSIS — I130 Hypertensive heart and chronic kidney disease with heart failure and stage 1 through stage 4 chronic kidney disease, or unspecified chronic kidney disease: Secondary | ICD-10-CM | POA: Diagnosis not present

## 2020-10-11 DIAGNOSIS — K5790 Diverticulosis of intestine, part unspecified, without perforation or abscess without bleeding: Secondary | ICD-10-CM | POA: Diagnosis not present

## 2020-10-11 DIAGNOSIS — I480 Paroxysmal atrial fibrillation: Secondary | ICD-10-CM | POA: Diagnosis not present

## 2020-10-20 DIAGNOSIS — I130 Hypertensive heart and chronic kidney disease with heart failure and stage 1 through stage 4 chronic kidney disease, or unspecified chronic kidney disease: Secondary | ICD-10-CM | POA: Diagnosis not present

## 2020-10-20 DIAGNOSIS — I480 Paroxysmal atrial fibrillation: Secondary | ICD-10-CM | POA: Diagnosis not present

## 2020-10-20 DIAGNOSIS — N1832 Chronic kidney disease, stage 3b: Secondary | ICD-10-CM | POA: Diagnosis not present

## 2020-10-20 DIAGNOSIS — I509 Heart failure, unspecified: Secondary | ICD-10-CM | POA: Diagnosis not present

## 2020-10-20 DIAGNOSIS — K5790 Diverticulosis of intestine, part unspecified, without perforation or abscess without bleeding: Secondary | ICD-10-CM | POA: Diagnosis not present

## 2020-10-20 DIAGNOSIS — I700 Atherosclerosis of aorta: Secondary | ICD-10-CM | POA: Diagnosis not present

## 2020-11-10 DIAGNOSIS — F0280 Dementia in other diseases classified elsewhere without behavioral disturbance: Secondary | ICD-10-CM | POA: Diagnosis not present

## 2020-11-10 DIAGNOSIS — I480 Paroxysmal atrial fibrillation: Secondary | ICD-10-CM | POA: Diagnosis not present

## 2020-11-10 DIAGNOSIS — N1832 Chronic kidney disease, stage 3b: Secondary | ICD-10-CM | POA: Diagnosis not present

## 2020-11-10 DIAGNOSIS — G301 Alzheimer's disease with late onset: Secondary | ICD-10-CM | POA: Diagnosis not present

## 2020-11-10 DIAGNOSIS — I129 Hypertensive chronic kidney disease with stage 1 through stage 4 chronic kidney disease, or unspecified chronic kidney disease: Secondary | ICD-10-CM | POA: Diagnosis not present

## 2020-11-22 DIAGNOSIS — G250 Essential tremor: Secondary | ICD-10-CM | POA: Diagnosis not present

## 2020-11-22 DIAGNOSIS — Z79899 Other long term (current) drug therapy: Secondary | ICD-10-CM | POA: Diagnosis not present

## 2020-11-22 DIAGNOSIS — Z0000 Encounter for general adult medical examination without abnormal findings: Secondary | ICD-10-CM | POA: Diagnosis not present

## 2020-11-22 DIAGNOSIS — I129 Hypertensive chronic kidney disease with stage 1 through stage 4 chronic kidney disease, or unspecified chronic kidney disease: Secondary | ICD-10-CM | POA: Diagnosis not present

## 2020-11-22 DIAGNOSIS — N1832 Chronic kidney disease, stage 3b: Secondary | ICD-10-CM | POA: Diagnosis not present

## 2020-11-22 DIAGNOSIS — E538 Deficiency of other specified B group vitamins: Secondary | ICD-10-CM | POA: Diagnosis not present

## 2020-11-22 DIAGNOSIS — G4730 Sleep apnea, unspecified: Secondary | ICD-10-CM | POA: Diagnosis not present

## 2020-11-22 DIAGNOSIS — I700 Atherosclerosis of aorta: Secondary | ICD-10-CM | POA: Diagnosis not present

## 2020-11-22 DIAGNOSIS — I480 Paroxysmal atrial fibrillation: Secondary | ICD-10-CM | POA: Diagnosis not present

## 2020-11-22 DIAGNOSIS — Z1389 Encounter for screening for other disorder: Secondary | ICD-10-CM | POA: Diagnosis not present

## 2020-11-23 ENCOUNTER — Encounter (HOSPITAL_COMMUNITY): Payer: Self-pay | Admitting: Nurse Practitioner

## 2020-11-23 ENCOUNTER — Other Ambulatory Visit: Payer: Self-pay

## 2020-11-23 ENCOUNTER — Ambulatory Visit (HOSPITAL_COMMUNITY)
Admission: RE | Admit: 2020-11-23 | Discharge: 2020-11-23 | Disposition: A | Discharge status: Home / Self Care | Payer: Medicare Other | Source: Ambulatory Visit | Attending: Nurse Practitioner | Admitting: Nurse Practitioner

## 2020-11-23 VITALS — BP 130/64 | HR 58 | Ht 64.0 in | Wt 168.2 lb

## 2020-11-23 DIAGNOSIS — D6869 Other thrombophilia: Secondary | ICD-10-CM | POA: Diagnosis not present

## 2020-11-23 DIAGNOSIS — I509 Heart failure, unspecified: Secondary | ICD-10-CM | POA: Diagnosis not present

## 2020-11-23 DIAGNOSIS — Z79899 Other long term (current) drug therapy: Secondary | ICD-10-CM | POA: Insufficient documentation

## 2020-11-23 DIAGNOSIS — Z7901 Long term (current) use of anticoagulants: Secondary | ICD-10-CM | POA: Insufficient documentation

## 2020-11-23 DIAGNOSIS — I4892 Unspecified atrial flutter: Secondary | ICD-10-CM | POA: Insufficient documentation

## 2020-11-23 DIAGNOSIS — I4819 Other persistent atrial fibrillation: Secondary | ICD-10-CM | POA: Diagnosis not present

## 2020-11-23 DIAGNOSIS — I110 Hypertensive heart disease with heart failure: Secondary | ICD-10-CM | POA: Insufficient documentation

## 2020-11-23 NOTE — Progress Notes (Signed)
Primary Care Physician: Lajean Manes, MD Referring Physician: Dr. Felipa Eth EP: Dr. Dahlia Bailiff Stephanie Ritter is a 87 y.o. female with a h/o paroxysmal afib with conversion to SR with amiodarone, which did not require a cardioversion. She is s/p hernia repair 08/07/18 and has recovered nicely. She did note one episode of bradycardia associated with some lightheadedness after her surgery. Denies syncope or falls.  F/u in afib clinic, 7/1. She has had her  meds adjusted and wore a monitor for her daughter reporting that times her reporting that she didn't feel good. Her monitor did not show any afib or significant brady/pauses. She did have some junctional rhythm, which she triggered but also triggered some SR. Today in the office, she does not report any significant issues, no weak or lightheaded spells. No falls. She thinks she is just bored and depressed for having to stay in so much with covid.   F/u in afib clinic, 05/15/19. She feels at her baseline. Feels alittle  fatigue at times but no unusual dizziness. She is in SR today.   F/u in afib clinic, 11/20/19. She  is in SR and feels well. The daughter states that she has had a good 6 months. She has had her covid shots. No bleeding  Issues with eliquis, CHA2DS2VASc of 5.   F/u in afib clinic, 05/25/20. She is in the afib clinic for f/u. She remains in SR. She has had an uneventful 6 months. Continues  on amiodarone 100 mg daily and eliquis  5 mg bid. Her labs were last scheduled with Dr.Stoneking in August.  Will request for review.   F/u in afib clinic, 11/23/20. She reports that she is doing well staying in [place]. Continues  on low dose amiodarone at 100 mg daily. No issues with eliquis. Had labs (reviewed) from  PCP recently and stable. No shortness of breath or cough.   Today, she denies symptoms of palpitations, chest pain, shortness of breath, orthopnea, PND, lower extremity edema, presyncope, syncope, or neurologic sequela.  The  patient is tolerating medications without difficulties and is otherwise without complaint today.   Past Medical History:  Diagnosis Date  . CHF (congestive heart failure) ([place])   . Hypertension   . Persistent atrial fibrillation ([facility])   . Tinnitus    Past Surgical History:  Procedure Laterality Date  . APPENDECTOMY     60 years ago  . ATRIAL FIBRILLATION ABLATION N/A    8 years ago  . BOWEL RESECTION N/A 08/07/2018   Procedure: SMALL BOWEL RESECTION;  Surgeon: Clovis Riley, MD;  Location: [facility];  Service: General;  Laterality: N/A;  . hip replacement Bilateral    4 and 8 years ago  . INGUINAL HERNIA REPAIR Right 08/07/2018   Procedure: Laparoscopic right femoral hernia repair with mesh, possible open, possible bowel resection;  Surgeon: Clovis Riley, MD;  Location: [facility];  Service: General;  Laterality: Right;    Current Outpatient Medications  Medication Sig Dispense Refill  . amiodarone (PACERONE) 200 MG tablet TAKE ONE-HALF TABLET BY  MOUTH DAILY 45 tablet 3  . diltiazem (CARDIZEM CD) 180 MG 24 hr capsule Take 1 capsule (180 mg total) by mouth daily. 90 capsule 3  . ELIQUIS 5 MG TABS tablet TAKE 1 TABLET BY MOUTH  TWICE DAILY 180 tablet 3  . escitalopram (LEXAPRO) 20 MG tablet Take 20 mg by mouth daily.   11  . losartan (COZAAR) 25 MG tablet Take 25 mg by mouth  daily.    . Multiple Vitamins-Minerals (PRESERVISION/LUTEIN) CAPS Take 1 capsule by mouth 2 (two) times daily.     Marland Kitchen MYRBETRIQ 25 MG TB24 tablet 1 tablet daily.    Marland Kitchen acetaminophen (TYLENOL) 325 MG tablet Take 2 tablets (650 mg total) by mouth every 6 (six) hours. (Patient taking differently: Take 650 mg by mouth as needed. )    . Carboxymethylcellulose Sodium (EYE DROPS OP) Place 2 drops into both eyes as needed (for dry eyes).     Marland Kitchen ipratropium (ATROVENT) 0.03 % nasal spray Place 1 spray into both nostrils as needed.      No current facility-administered medications for this encounter.    Allergies  Allergen  Reactions  . Asa [Aspirin] Other (See Comments)    Excessive bleeding  . Nsaids Other (See Comments)    Bleeding, ulcers    Social History   Socioeconomic History  . Marital status: Married    Spouse name: Not on file  . Number of children: Not on file  . Years of education: Not on file  . Highest education level: Not on file  Occupational History  . Not on file  Tobacco Use  . Smoking status: Former Smoker    Types: Cigarettes  . Smokeless tobacco: Never Used  . Tobacco comment: Pt quit 50+ years ago  Vaping Use  . Vaping Use: Never used  Substance and Sexual Activity  . Alcohol use: Yes    Alcohol/week: 1.0 standard drink    Types: 1 Glasses of wine per week    Comment: occasionally  . Drug use: No  . Sexual activity: Not on file  Other Topics Concern  . Not on file  Social History Narrative  . Not on file   Social Determinants of Health   Financial Resource Strain: Not on file  Food Insecurity: Not on file  Transportation Needs: Not on file  Physical Activity: Not on file  Stress: Not on file  Social Connections: Not on file  Intimate Partner Violence: Not on file    Family History  Family history unknown: Yes    ROS- All systems are reviewed and negative except as per the HPI above  Physical Exam: Vitals:   11/23/20 1553  BP: 130/64  Pulse: (!) 58  Weight: 76.3 kg  Height: 5\' 4"  (1.626 m)   Wt Readings from Last 3 Encounters:  11/23/20 76.3 kg  05/25/20 76.7 kg  11/20/19 76 kg    Labs: Lab Results  Component Value Date   NA 141 11/20/2019   K 4.3 11/20/2019   CL 105 11/20/2019   CO2 27 11/20/2019   GLUCOSE 124 (H) 11/20/2019   BUN 19 11/20/2019   CREATININE 0.85 11/20/2019   CALCIUM 8.7 (L) 11/20/2019   No results found for: INR No results found for: CHOL, HDL, LDLCALC, TRIG   GEN- The patient is well appearing elderly female, alert and oriented x 3 today.   HEENT-head normocephalic, atraumatic, sclera clear, conjunctiva pink,  hearing intact, trachea midline. Lungs- Clear to ausculation bilaterally, normal work of breathing Heart- Regular rate and rhythm, no murmurs, rubs or gallops  GI- soft, NT, ND, + BS Extremities- no clubbing, cyanosis, or edema MS- no significant deformity or atrophy Skin- no rash or lesion Psych- euthymic mood, full affect Neuro- strength and sensation are intact   EKG-  Sinus brady at 58 bpm, with first degree AV block, pr int 226 ms, qrs int 104 ms, qtc 433 ms  Assessment and Plan: 1. Persistent Afib/flutter Continues in SR Feels well  Continue  Amiodarone 100 mg daily. Continue  diltiazem dose 180 mg daily Continue Eliquis 5 mg BID, CHA2DS2VASc score of 5 Cmet/tsh/cbc recently  drawn by Dr, Felipa Eth and stable   2. HTN Stable, no changes today  F/u in 9 months  Butch Penny C. Jalia Zuniga, [facility] [address] [place], [place] [address] [phone]

## 2020-11-29 DIAGNOSIS — G301 Alzheimer's disease with late onset: Secondary | ICD-10-CM | POA: Diagnosis not present

## 2020-11-29 DIAGNOSIS — G250 Essential tremor: Secondary | ICD-10-CM | POA: Diagnosis not present

## 2020-11-29 DIAGNOSIS — F0280 Dementia in other diseases classified elsewhere without behavioral disturbance: Secondary | ICD-10-CM | POA: Diagnosis not present

## 2020-11-29 DIAGNOSIS — Z23 Encounter for immunization: Secondary | ICD-10-CM | POA: Diagnosis not present

## 2020-11-29 DIAGNOSIS — N1832 Chronic kidney disease, stage 3b: Secondary | ICD-10-CM | POA: Diagnosis not present

## 2020-11-29 DIAGNOSIS — I129 Hypertensive chronic kidney disease with stage 1 through stage 4 chronic kidney disease, or unspecified chronic kidney disease: Secondary | ICD-10-CM | POA: Diagnosis not present

## 2020-12-01 DIAGNOSIS — Z85828 Personal history of other malignant neoplasm of skin: Secondary | ICD-10-CM | POA: Diagnosis not present

## 2020-12-01 DIAGNOSIS — L570 Actinic keratosis: Secondary | ICD-10-CM | POA: Diagnosis not present

## 2020-12-01 DIAGNOSIS — L821 Other seborrheic keratosis: Secondary | ICD-10-CM | POA: Diagnosis not present

## 2020-12-01 DIAGNOSIS — D1801 Hemangioma of skin and subcutaneous tissue: Secondary | ICD-10-CM | POA: Diagnosis not present

## 2020-12-13 ENCOUNTER — Encounter (INDEPENDENT_AMBULATORY_CARE_PROVIDER_SITE_OTHER): Payer: Medicare Other | Admitting: Ophthalmology

## 2020-12-28 ENCOUNTER — Encounter (INDEPENDENT_AMBULATORY_CARE_PROVIDER_SITE_OTHER): Payer: Medicare Other | Admitting: Ophthalmology

## 2020-12-28 ENCOUNTER — Other Ambulatory Visit: Payer: Self-pay

## 2020-12-28 DIAGNOSIS — H353132 Nonexudative age-related macular degeneration, bilateral, intermediate dry stage: Secondary | ICD-10-CM

## 2020-12-28 DIAGNOSIS — H35033 Hypertensive retinopathy, bilateral: Secondary | ICD-10-CM

## 2020-12-28 DIAGNOSIS — H34832 Tributary (branch) retinal vein occlusion, left eye, with macular edema: Secondary | ICD-10-CM | POA: Diagnosis not present

## 2020-12-28 DIAGNOSIS — I10 Essential (primary) hypertension: Secondary | ICD-10-CM

## 2021-02-21 DIAGNOSIS — I129 Hypertensive chronic kidney disease with stage 1 through stage 4 chronic kidney disease, or unspecified chronic kidney disease: Secondary | ICD-10-CM | POA: Diagnosis not present

## 2021-02-21 DIAGNOSIS — N1832 Chronic kidney disease, stage 3b: Secondary | ICD-10-CM | POA: Diagnosis not present

## 2021-02-21 DIAGNOSIS — M25572 Pain in left ankle and joints of left foot: Secondary | ICD-10-CM | POA: Diagnosis not present

## 2021-02-22 DIAGNOSIS — S8265XA Nondisplaced fracture of lateral malleolus of left fibula, initial encounter for closed fracture: Secondary | ICD-10-CM | POA: Diagnosis not present

## 2021-03-15 DIAGNOSIS — S8265XA Nondisplaced fracture of lateral malleolus of left fibula, initial encounter for closed fracture: Secondary | ICD-10-CM | POA: Diagnosis not present

## 2021-03-30 DIAGNOSIS — I4891 Unspecified atrial fibrillation: Secondary | ICD-10-CM | POA: Diagnosis not present

## 2021-03-30 DIAGNOSIS — Z9181 History of falling: Secondary | ICD-10-CM | POA: Diagnosis not present

## 2021-03-30 DIAGNOSIS — I10 Essential (primary) hypertension: Secondary | ICD-10-CM | POA: Diagnosis not present

## 2021-03-30 DIAGNOSIS — S82832D Other fracture of upper and lower end of left fibula, subsequent encounter for closed fracture with routine healing: Secondary | ICD-10-CM | POA: Diagnosis not present

## 2021-03-30 DIAGNOSIS — Z7901 Long term (current) use of anticoagulants: Secondary | ICD-10-CM | POA: Diagnosis not present

## 2021-04-05 DIAGNOSIS — S8265XD Nondisplaced fracture of lateral malleolus of left fibula, subsequent encounter for closed fracture with routine healing: Secondary | ICD-10-CM | POA: Diagnosis not present

## 2021-04-09 DIAGNOSIS — S82832D Other fracture of upper and lower end of left fibula, subsequent encounter for closed fracture with routine healing: Secondary | ICD-10-CM | POA: Diagnosis not present

## 2021-04-09 DIAGNOSIS — I10 Essential (primary) hypertension: Secondary | ICD-10-CM | POA: Diagnosis not present

## 2021-04-09 DIAGNOSIS — Z9181 History of falling: Secondary | ICD-10-CM | POA: Diagnosis not present

## 2021-04-09 DIAGNOSIS — Z7901 Long term (current) use of anticoagulants: Secondary | ICD-10-CM | POA: Diagnosis not present

## 2021-04-09 DIAGNOSIS — I4891 Unspecified atrial fibrillation: Secondary | ICD-10-CM | POA: Diagnosis not present

## 2021-04-13 DIAGNOSIS — I4891 Unspecified atrial fibrillation: Secondary | ICD-10-CM | POA: Diagnosis not present

## 2021-04-13 DIAGNOSIS — Z7901 Long term (current) use of anticoagulants: Secondary | ICD-10-CM | POA: Diagnosis not present

## 2021-04-13 DIAGNOSIS — S82832D Other fracture of upper and lower end of left fibula, subsequent encounter for closed fracture with routine healing: Secondary | ICD-10-CM | POA: Diagnosis not present

## 2021-04-13 DIAGNOSIS — Z9181 History of falling: Secondary | ICD-10-CM | POA: Diagnosis not present

## 2021-04-13 DIAGNOSIS — I10 Essential (primary) hypertension: Secondary | ICD-10-CM | POA: Diagnosis not present

## 2021-04-15 DIAGNOSIS — I4891 Unspecified atrial fibrillation: Secondary | ICD-10-CM | POA: Diagnosis not present

## 2021-04-15 DIAGNOSIS — I10 Essential (primary) hypertension: Secondary | ICD-10-CM | POA: Diagnosis not present

## 2021-04-15 DIAGNOSIS — Z7901 Long term (current) use of anticoagulants: Secondary | ICD-10-CM | POA: Diagnosis not present

## 2021-04-15 DIAGNOSIS — Z9181 History of falling: Secondary | ICD-10-CM | POA: Diagnosis not present

## 2021-04-15 DIAGNOSIS — S82832D Other fracture of upper and lower end of left fibula, subsequent encounter for closed fracture with routine healing: Secondary | ICD-10-CM | POA: Diagnosis not present

## 2021-04-18 DIAGNOSIS — I4891 Unspecified atrial fibrillation: Secondary | ICD-10-CM | POA: Diagnosis not present

## 2021-04-18 DIAGNOSIS — S82832D Other fracture of upper and lower end of left fibula, subsequent encounter for closed fracture with routine healing: Secondary | ICD-10-CM | POA: Diagnosis not present

## 2021-04-18 DIAGNOSIS — I10 Essential (primary) hypertension: Secondary | ICD-10-CM | POA: Diagnosis not present

## 2021-04-18 DIAGNOSIS — Z9181 History of falling: Secondary | ICD-10-CM | POA: Diagnosis not present

## 2021-04-18 DIAGNOSIS — Z7901 Long term (current) use of anticoagulants: Secondary | ICD-10-CM | POA: Diagnosis not present

## 2021-04-19 DIAGNOSIS — G250 Essential tremor: Secondary | ICD-10-CM | POA: Diagnosis not present

## 2021-04-19 DIAGNOSIS — Z79899 Other long term (current) drug therapy: Secondary | ICD-10-CM | POA: Diagnosis not present

## 2021-04-19 DIAGNOSIS — I480 Paroxysmal atrial fibrillation: Secondary | ICD-10-CM | POA: Diagnosis not present

## 2021-04-19 DIAGNOSIS — N1832 Chronic kidney disease, stage 3b: Secondary | ICD-10-CM | POA: Diagnosis not present

## 2021-04-19 DIAGNOSIS — Z23 Encounter for immunization: Secondary | ICD-10-CM | POA: Diagnosis not present

## 2021-04-19 DIAGNOSIS — I129 Hypertensive chronic kidney disease with stage 1 through stage 4 chronic kidney disease, or unspecified chronic kidney disease: Secondary | ICD-10-CM | POA: Diagnosis not present

## 2021-04-20 DIAGNOSIS — Z7901 Long term (current) use of anticoagulants: Secondary | ICD-10-CM | POA: Diagnosis not present

## 2021-04-20 DIAGNOSIS — I10 Essential (primary) hypertension: Secondary | ICD-10-CM | POA: Diagnosis not present

## 2021-04-20 DIAGNOSIS — Z9181 History of falling: Secondary | ICD-10-CM | POA: Diagnosis not present

## 2021-04-20 DIAGNOSIS — S82832D Other fracture of upper and lower end of left fibula, subsequent encounter for closed fracture with routine healing: Secondary | ICD-10-CM | POA: Diagnosis not present

## 2021-04-20 DIAGNOSIS — I4891 Unspecified atrial fibrillation: Secondary | ICD-10-CM | POA: Diagnosis not present

## 2021-04-25 DIAGNOSIS — S82832D Other fracture of upper and lower end of left fibula, subsequent encounter for closed fracture with routine healing: Secondary | ICD-10-CM | POA: Diagnosis not present

## 2021-04-25 DIAGNOSIS — I10 Essential (primary) hypertension: Secondary | ICD-10-CM | POA: Diagnosis not present

## 2021-04-25 DIAGNOSIS — I4891 Unspecified atrial fibrillation: Secondary | ICD-10-CM | POA: Diagnosis not present

## 2021-04-25 DIAGNOSIS — Z9181 History of falling: Secondary | ICD-10-CM | POA: Diagnosis not present

## 2021-04-25 DIAGNOSIS — Z7901 Long term (current) use of anticoagulants: Secondary | ICD-10-CM | POA: Diagnosis not present

## 2021-04-28 DIAGNOSIS — I4891 Unspecified atrial fibrillation: Secondary | ICD-10-CM | POA: Diagnosis not present

## 2021-04-28 DIAGNOSIS — Z7901 Long term (current) use of anticoagulants: Secondary | ICD-10-CM | POA: Diagnosis not present

## 2021-04-28 DIAGNOSIS — S82832D Other fracture of upper and lower end of left fibula, subsequent encounter for closed fracture with routine healing: Secondary | ICD-10-CM | POA: Diagnosis not present

## 2021-04-28 DIAGNOSIS — Z9181 History of falling: Secondary | ICD-10-CM | POA: Diagnosis not present

## 2021-04-28 DIAGNOSIS — I10 Essential (primary) hypertension: Secondary | ICD-10-CM | POA: Diagnosis not present

## 2021-04-29 DIAGNOSIS — I10 Essential (primary) hypertension: Secondary | ICD-10-CM | POA: Diagnosis not present

## 2021-04-29 DIAGNOSIS — I4891 Unspecified atrial fibrillation: Secondary | ICD-10-CM | POA: Diagnosis not present

## 2021-04-29 DIAGNOSIS — Z9181 History of falling: Secondary | ICD-10-CM | POA: Diagnosis not present

## 2021-04-29 DIAGNOSIS — S82832D Other fracture of upper and lower end of left fibula, subsequent encounter for closed fracture with routine healing: Secondary | ICD-10-CM | POA: Diagnosis not present

## 2021-04-29 DIAGNOSIS — Z7901 Long term (current) use of anticoagulants: Secondary | ICD-10-CM | POA: Diagnosis not present

## 2021-05-02 DIAGNOSIS — Z9181 History of falling: Secondary | ICD-10-CM | POA: Diagnosis not present

## 2021-05-02 DIAGNOSIS — I10 Essential (primary) hypertension: Secondary | ICD-10-CM | POA: Diagnosis not present

## 2021-05-02 DIAGNOSIS — S82832D Other fracture of upper and lower end of left fibula, subsequent encounter for closed fracture with routine healing: Secondary | ICD-10-CM | POA: Diagnosis not present

## 2021-05-02 DIAGNOSIS — I4891 Unspecified atrial fibrillation: Secondary | ICD-10-CM | POA: Diagnosis not present

## 2021-05-02 DIAGNOSIS — Z7901 Long term (current) use of anticoagulants: Secondary | ICD-10-CM | POA: Diagnosis not present

## 2021-05-09 DIAGNOSIS — F0280 Dementia in other diseases classified elsewhere without behavioral disturbance: Secondary | ICD-10-CM | POA: Diagnosis not present

## 2021-05-09 DIAGNOSIS — R634 Abnormal weight loss: Secondary | ICD-10-CM | POA: Diagnosis not present

## 2021-05-09 DIAGNOSIS — G301 Alzheimer's disease with late onset: Secondary | ICD-10-CM | POA: Diagnosis not present

## 2021-05-09 DIAGNOSIS — I480 Paroxysmal atrial fibrillation: Secondary | ICD-10-CM | POA: Diagnosis not present

## 2021-05-09 DIAGNOSIS — N1832 Chronic kidney disease, stage 3b: Secondary | ICD-10-CM | POA: Diagnosis not present

## 2021-05-09 DIAGNOSIS — I129 Hypertensive chronic kidney disease with stage 1 through stage 4 chronic kidney disease, or unspecified chronic kidney disease: Secondary | ICD-10-CM | POA: Diagnosis not present

## 2021-05-09 DIAGNOSIS — R5383 Other fatigue: Secondary | ICD-10-CM | POA: Diagnosis not present

## 2021-05-11 DIAGNOSIS — I4891 Unspecified atrial fibrillation: Secondary | ICD-10-CM | POA: Diagnosis not present

## 2021-05-11 DIAGNOSIS — S82832D Other fracture of upper and lower end of left fibula, subsequent encounter for closed fracture with routine healing: Secondary | ICD-10-CM | POA: Diagnosis not present

## 2021-05-11 DIAGNOSIS — Z7901 Long term (current) use of anticoagulants: Secondary | ICD-10-CM | POA: Diagnosis not present

## 2021-05-11 DIAGNOSIS — I10 Essential (primary) hypertension: Secondary | ICD-10-CM | POA: Diagnosis not present

## 2021-05-11 DIAGNOSIS — Z9181 History of falling: Secondary | ICD-10-CM | POA: Diagnosis not present

## 2021-05-17 DIAGNOSIS — I10 Essential (primary) hypertension: Secondary | ICD-10-CM | POA: Diagnosis not present

## 2021-05-17 DIAGNOSIS — S82832D Other fracture of upper and lower end of left fibula, subsequent encounter for closed fracture with routine healing: Secondary | ICD-10-CM | POA: Diagnosis not present

## 2021-05-17 DIAGNOSIS — Z7901 Long term (current) use of anticoagulants: Secondary | ICD-10-CM | POA: Diagnosis not present

## 2021-05-17 DIAGNOSIS — Z9181 History of falling: Secondary | ICD-10-CM | POA: Diagnosis not present

## 2021-05-17 DIAGNOSIS — I4891 Unspecified atrial fibrillation: Secondary | ICD-10-CM | POA: Diagnosis not present

## 2021-05-23 DIAGNOSIS — I4891 Unspecified atrial fibrillation: Secondary | ICD-10-CM | POA: Diagnosis not present

## 2021-05-23 DIAGNOSIS — I10 Essential (primary) hypertension: Secondary | ICD-10-CM | POA: Diagnosis not present

## 2021-05-23 DIAGNOSIS — Z7901 Long term (current) use of anticoagulants: Secondary | ICD-10-CM | POA: Diagnosis not present

## 2021-05-23 DIAGNOSIS — S82832D Other fracture of upper and lower end of left fibula, subsequent encounter for closed fracture with routine healing: Secondary | ICD-10-CM | POA: Diagnosis not present

## 2021-05-23 DIAGNOSIS — Z9181 History of falling: Secondary | ICD-10-CM | POA: Diagnosis not present

## 2021-05-26 ENCOUNTER — Other Ambulatory Visit (HOSPITAL_COMMUNITY): Payer: Self-pay | Admitting: Nurse Practitioner

## 2021-06-09 DIAGNOSIS — R300 Dysuria: Secondary | ICD-10-CM | POA: Diagnosis not present

## 2021-07-25 ENCOUNTER — Other Ambulatory Visit (HOSPITAL_COMMUNITY): Payer: Self-pay | Admitting: Nurse Practitioner

## 2021-08-18 DIAGNOSIS — R110 Nausea: Secondary | ICD-10-CM | POA: Diagnosis not present

## 2021-08-19 ENCOUNTER — Other Ambulatory Visit: Payer: Self-pay | Admitting: Geriatric Medicine

## 2021-08-19 ENCOUNTER — Ambulatory Visit
Admission: RE | Admit: 2021-08-19 | Discharge: 2021-08-19 | Disposition: A | Discharge status: Home / Self Care | Payer: Medicare Other | Source: Ambulatory Visit | Attending: Geriatric Medicine | Admitting: Geriatric Medicine

## 2021-08-19 ENCOUNTER — Other Ambulatory Visit: Payer: Self-pay

## 2021-08-19 DIAGNOSIS — S0990XA Unspecified injury of head, initial encounter: Secondary | ICD-10-CM

## 2021-08-19 DIAGNOSIS — R4182 Altered mental status, unspecified: Secondary | ICD-10-CM | POA: Diagnosis not present

## 2021-08-30 ENCOUNTER — Ambulatory Visit (HOSPITAL_COMMUNITY): Payer: Medicare Other | Admitting: Nurse Practitioner

## 2021-08-30 DIAGNOSIS — I480 Paroxysmal atrial fibrillation: Secondary | ICD-10-CM | POA: Diagnosis not present

## 2021-08-30 DIAGNOSIS — G301 Alzheimer's disease with late onset: Secondary | ICD-10-CM | POA: Diagnosis not present

## 2021-08-30 DIAGNOSIS — Z79899 Other long term (current) drug therapy: Secondary | ICD-10-CM | POA: Diagnosis not present

## 2021-08-30 DIAGNOSIS — N1832 Chronic kidney disease, stage 3b: Secondary | ICD-10-CM | POA: Diagnosis not present

## 2021-08-30 DIAGNOSIS — F331 Major depressive disorder, recurrent, moderate: Secondary | ICD-10-CM | POA: Diagnosis not present

## 2021-08-30 DIAGNOSIS — E538 Deficiency of other specified B group vitamins: Secondary | ICD-10-CM | POA: Diagnosis not present

## 2021-08-30 DIAGNOSIS — I129 Hypertensive chronic kidney disease with stage 1 through stage 4 chronic kidney disease, or unspecified chronic kidney disease: Secondary | ICD-10-CM | POA: Diagnosis not present

## 2021-08-30 DIAGNOSIS — I700 Atherosclerosis of aorta: Secondary | ICD-10-CM | POA: Diagnosis not present

## 2021-08-31 ENCOUNTER — Ambulatory Visit (HOSPITAL_COMMUNITY)
Admission: RE | Admit: 2021-08-31 | Discharge: 2021-08-31 | Disposition: A | Discharge status: Home / Self Care | Payer: Medicare Other | Source: Ambulatory Visit | Attending: Nurse Practitioner | Admitting: Nurse Practitioner

## 2021-08-31 ENCOUNTER — Encounter (HOSPITAL_COMMUNITY): Payer: Self-pay | Admitting: Nurse Practitioner

## 2021-08-31 ENCOUNTER — Other Ambulatory Visit: Payer: Self-pay

## 2021-08-31 VITALS — BP 138/70 | HR 66 | Ht 64.0 in | Wt 154.0 lb

## 2021-08-31 DIAGNOSIS — Z7901 Long term (current) use of anticoagulants: Secondary | ICD-10-CM | POA: Diagnosis not present

## 2021-08-31 DIAGNOSIS — I4819 Other persistent atrial fibrillation: Secondary | ICD-10-CM | POA: Insufficient documentation

## 2021-08-31 DIAGNOSIS — I10 Essential (primary) hypertension: Secondary | ICD-10-CM | POA: Diagnosis not present

## 2021-08-31 DIAGNOSIS — Z79899 Other long term (current) drug therapy: Secondary | ICD-10-CM | POA: Diagnosis not present

## 2021-08-31 DIAGNOSIS — I4892 Unspecified atrial flutter: Secondary | ICD-10-CM | POA: Diagnosis not present

## 2021-08-31 DIAGNOSIS — D6869 Other thrombophilia: Secondary | ICD-10-CM

## 2021-08-31 DIAGNOSIS — Z5181 Encounter for therapeutic drug level monitoring: Secondary | ICD-10-CM | POA: Insufficient documentation

## 2021-08-31 DIAGNOSIS — J984 Other disorders of lung: Secondary | ICD-10-CM | POA: Diagnosis not present

## 2021-08-31 DIAGNOSIS — I77810 Thoracic aortic ectasia: Secondary | ICD-10-CM | POA: Diagnosis not present

## 2021-08-31 DIAGNOSIS — I700 Atherosclerosis of aorta: Secondary | ICD-10-CM | POA: Diagnosis not present

## 2021-08-31 MED ORDER — AMIODARONE HCL 200 MG PO TABS: 100.0000 mg | ORAL_TABLET | ORAL | 3 refills | Status: DC

## 2021-08-31 NOTE — Patient Instructions (Signed)
Decrease amiodarone to 100mg  on Monday, Wednesday, Friday

## 2021-08-31 NOTE — Progress Notes (Signed)
Primary Care Physician: Lajean Manes, MD Referring Physician: Dr. Felipa Eth EP: Dr. Dahlia Bailiff Stephanie Ritter is a 88 y.o. female with a h/o paroxysmal afib with conversion to SR with amiodarone, which did not require a cardioversion. She is s/p hernia repair 08/07/18 and has recovered nicely. She did note one episode of bradycardia associated with some lightheadedness after her surgery. Denies syncope or falls.  F/u in afib clinic, 7/1. She has had her  meds adjusted and wore a monitor for her daughter reporting that times her reporting that she didn't feel good. Her monitor did not show any afib or significant brady/pauses. She did have some junctional rhythm, which she triggered but also triggered some SR. Today in the office, she does not report any significant issues, no weak or lightheaded spells. No falls. She thinks she is just bored and depressed for having to stay in so much with covid.   F/u in afib clinic, 05/15/19. She feels at her baseline. Feels alittle  fatigue at times but no unusual dizziness. She is in SR today.   F/u in afib clinic, 11/20/19. She  is in SR and feels well. The daughter states that she has had a good 6 months. She has had her covid shots. No bleeding  Issues with eliquis, CHA2DS2VASc of 5.   F/u in afib clinic, 05/25/20. She is in the afib clinic for f/u. She remains in SR. She has had an uneventful 6 months. Continues  on amiodarone 100 mg daily and eliquis  5 mg bid. Her labs were last scheduled with Dr.Stoneking in August.  Will request for review.   F/u in afib clinic, 11/23/20. She reports that she is doing well staying in [place]. Continues  on low dose amiodarone at 100 mg daily. No issues with eliquis. Had labs (reviewed) from  PCP recently and stable. No shortness of breath or cough.   F/u in afib clinic, 08/31/21. Her daughter reports that 2-3 weeks ago she became very tired. She saw her PCP yesterday and had test, non that were suggestive of any  specific dx. She has lost around 12 lbs over several months. Appetite has decreased. The daughter feels that she is depressed. No afib to report. No issues with anticoagulation.   Today, she denies symptoms of palpitations, chest pain, shortness of breath, orthopnea, PND, lower extremity edema, presyncope, syncope, or neurologic sequela.  The patient is tolerating medications without difficulties and is otherwise without complaint today.   Past Medical History:  Diagnosis Date   CHF (congestive heart failure) ([facility])    Hypertension    Persistent atrial fibrillation ([facility])    Tinnitus    Past Surgical History:  Procedure Laterality Date   APPENDECTOMY     60 years ago   ATRIAL FIBRILLATION ABLATION N/A    8 years ago   BOWEL RESECTION N/A 08/07/2018   Procedure: SMALL BOWEL RESECTION;  Surgeon: Clovis Riley, MD;  Location: [facility];  Service: General;  Laterality: N/A;   hip replacement Bilateral    4 and 8 years ago   [place] Right 08/07/2018   Procedure: Laparoscopic right femoral hernia repair with mesh, possible open, possible bowel resection;  Surgeon: Clovis Riley, MD;  Location: [facility];  Service: General;  Laterality: Right;    Current Outpatient Medications  Medication Sig Dispense Refill   acetaminophen (TYLENOL) 500 MG tablet 1 tablet as needed     apixaban (ELIQUIS) 5 MG TABS tablet Take 1 tablet (  5 mg total) by mouth 2 (two) times daily. Appointment Required For Further Refills [id number] 180 tablet 0   Carboxymethylcellulose Sodium (EYE DROPS OP) Place 2 drops into both eyes as needed (for dry eyes).      Cholecalciferol (VITAMIN D3) 25 MCG (1000 UT) CAPS Take 2 capsules by mouth every morning.     Cyanocobalamin (VITAMIN B12) 1000 MCG TBCR Taking one tablet by mouth every other day     diltiazem (CARDIZEM CD) 300 MG 24 hr capsule Take 300 mg by mouth daily.     escitalopram (LEXAPRO) 20 MG tablet Take 20 mg by mouth daily.   11   ipratropium (ATROVENT)  0.03 % nasal spray Place 1 spray into both nostrils as needed.      Multiple Vitamins-Minerals (PRESERVISION/LUTEIN) CAPS Take 1 capsule by mouth 2 (two) times daily.      MYRBETRIQ 25 MG TB24 tablet Take 1 tablet by mouth daily.     amiodarone (PACERONE) 200 MG tablet Take 0.5 tablets (100 mg total) by mouth every Monday, Wednesday, and Friday. 45 tablet 3   No current facility-administered medications for this encounter.    Allergies  Allergen Reactions   Asa [Aspirin] Other (See Comments)    Excessive bleeding   Nsaids Other (See Comments)    Bleeding, ulcers   Mirtazapine Other (See Comments)    Very groggy    Social History   Socioeconomic History   Marital status: Married    Spouse name: Not on file   Number of children: Not on file   Years of education: Not on file   Highest education level: Not on file  Occupational History   Not on file  Tobacco Use   Smoking status: Former    Types: Cigarettes   Smokeless tobacco: Never   Tobacco comments:    Pt quit 50+ years ago  Vaping Use   Vaping Use: Never used  Substance and Sexual Activity   Alcohol use: Yes    Alcohol/week: 1.0 standard drink    Types: 1 Glasses of wine per week    Comment: occasionally   Drug use: No   Sexual activity: Not on file  Other Topics Concern   Not on file  Social History Narrative   Not on file   Social Determinants of Health   Financial Resource Strain: Not on file  Food Insecurity: Not on file  Transportation Needs: Not on file  Physical Activity: Not on file  Stress: Not on file  Social Connections: Not on file  Intimate Partner Violence: Not on file    Family History  Family history unknown: Yes    ROS- All systems are reviewed and negative except as per the HPI above  Physical Exam: Vitals:   08/31/21 1509  BP: 138/70  Pulse: 66  Weight: 69.9 kg  Height: 5\' 4"  (1.626 m)   Wt Readings from Last 3 Encounters:  08/31/21 69.9 kg  11/23/20 76.3 kg  05/25/20  76.7 kg    Labs: Lab Results  Component Value Date   NA 141 11/20/2019   K 4.3 11/20/2019   CL 105 11/20/2019   CO2 27 11/20/2019   GLUCOSE 124 (H) 11/20/2019   BUN 19 11/20/2019   CREATININE 0.85 11/20/2019   CALCIUM 8.7 (L) 11/20/2019   No results found for: INR No results found for: CHOL, HDL, LDLCALC, TRIG   GEN- The patient is well appearing elderly female, alert and oriented x 3 today.   HEENT-head normocephalic, atraumatic,  sclera clear, conjunctiva pink, hearing intact, trachea midline. Lungs- Clear to ausculation bilaterally, normal work of breathing Heart- Regular rate and rhythm, no murmurs, rubs or gallops  GI- soft, NT, ND, + BS Extremities- no clubbing, cyanosis, or edema MS- no significant deformity or atrophy Skin- no rash or lesion Psych- euthymic mood, full affect Neuro- strength and sensation are intact   EKG-  Sinus rhythm at 66 bpm, pr int 232 ms, qrs int 106 ms, qtc 461 ms, LAFB. LVH, pr int 232 ms, qrs int 106 ms, qtc 461 ms     Assessment and Plan: 1. Persistent Afib/flutter Continues in SR Onset of fatigue a few weeks ago  Tests with PCP did not show any specific cause Can try to lower amiodarone to 100 mg M/W/F for fatigue  CXR for yearly surveillance of amio, she is not describing significant shortness of breath or new cough, lung sounds are normal  Continue  diltiazem dose 180 mg daily Continue Eliquis 5 mg BID, CHA2DS2VASc score of 5 Labs recently  drawn by Dr.  Felipa Eth reviewed   2. HTN Stable, no changes today  F/u in 9 months  Butch Penny C. Artelia Game, [facility] [address] [place], [place] [address] [phone]

## 2021-10-04 DIAGNOSIS — Z79899 Other long term (current) drug therapy: Secondary | ICD-10-CM | POA: Diagnosis not present

## 2021-10-04 DIAGNOSIS — I700 Atherosclerosis of aorta: Secondary | ICD-10-CM | POA: Diagnosis not present

## 2021-10-04 DIAGNOSIS — N1832 Chronic kidney disease, stage 3b: Secondary | ICD-10-CM | POA: Diagnosis not present

## 2021-10-04 DIAGNOSIS — Z0000 Encounter for general adult medical examination without abnormal findings: Secondary | ICD-10-CM | POA: Diagnosis not present

## 2021-10-04 DIAGNOSIS — I129 Hypertensive chronic kidney disease with stage 1 through stage 4 chronic kidney disease, or unspecified chronic kidney disease: Secondary | ICD-10-CM | POA: Diagnosis not present

## 2021-10-04 DIAGNOSIS — G301 Alzheimer's disease with late onset: Secondary | ICD-10-CM | POA: Diagnosis not present

## 2021-10-04 DIAGNOSIS — I480 Paroxysmal atrial fibrillation: Secondary | ICD-10-CM | POA: Diagnosis not present

## 2021-10-04 DIAGNOSIS — G250 Essential tremor: Secondary | ICD-10-CM | POA: Diagnosis not present

## 2021-10-04 DIAGNOSIS — D6869 Other thrombophilia: Secondary | ICD-10-CM | POA: Diagnosis not present

## 2021-10-04 DIAGNOSIS — F331 Major depressive disorder, recurrent, moderate: Secondary | ICD-10-CM | POA: Diagnosis not present

## 2021-12-04 ENCOUNTER — Other Ambulatory Visit (HOSPITAL_COMMUNITY): Payer: Self-pay | Admitting: Nurse Practitioner

## 2021-12-05 DIAGNOSIS — D225 Melanocytic nevi of trunk: Secondary | ICD-10-CM | POA: Diagnosis not present

## 2021-12-05 DIAGNOSIS — L821 Other seborrheic keratosis: Secondary | ICD-10-CM | POA: Diagnosis not present

## 2021-12-05 DIAGNOSIS — L7211 Pilar cyst: Secondary | ICD-10-CM | POA: Diagnosis not present

## 2021-12-05 DIAGNOSIS — D2272 Melanocytic nevi of left lower limb, including hip: Secondary | ICD-10-CM | POA: Diagnosis not present

## 2021-12-05 DIAGNOSIS — L570 Actinic keratosis: Secondary | ICD-10-CM | POA: Diagnosis not present

## 2021-12-05 DIAGNOSIS — D1801 Hemangioma of skin and subcutaneous tissue: Secondary | ICD-10-CM | POA: Diagnosis not present

## 2021-12-05 DIAGNOSIS — D2271 Melanocytic nevi of right lower limb, including hip: Secondary | ICD-10-CM | POA: Diagnosis not present

## 2021-12-05 DIAGNOSIS — Z85828 Personal history of other malignant neoplasm of skin: Secondary | ICD-10-CM | POA: Diagnosis not present

## 2021-12-05 DIAGNOSIS — C44311 Basal cell carcinoma of skin of nose: Secondary | ICD-10-CM | POA: Diagnosis not present

## 2021-12-30 ENCOUNTER — Encounter (INDEPENDENT_AMBULATORY_CARE_PROVIDER_SITE_OTHER): Payer: Medicare Other | Admitting: Ophthalmology

## 2022-01-17 DIAGNOSIS — C44311 Basal cell carcinoma of skin of nose: Secondary | ICD-10-CM | POA: Diagnosis not present

## 2022-01-17 DIAGNOSIS — Z85828 Personal history of other malignant neoplasm of skin: Secondary | ICD-10-CM | POA: Diagnosis not present

## 2022-02-03 ENCOUNTER — Other Ambulatory Visit: Payer: Self-pay | Admitting: Geriatric Medicine

## 2022-02-03 ENCOUNTER — Encounter (HOSPITAL_COMMUNITY): Payer: Self-pay

## 2022-02-03 ENCOUNTER — Emergency Department (HOSPITAL_COMMUNITY): Payer: Medicare Other

## 2022-02-03 ENCOUNTER — Other Ambulatory Visit: Payer: Self-pay

## 2022-02-03 ENCOUNTER — Inpatient Hospital Stay (HOSPITAL_COMMUNITY)
Admission: EM | Admit: 2022-02-03 | Discharge: 2022-02-11 | DRG: 417 | Disposition: A | Discharge status: Home Health | Payer: Medicare Other | Attending: Internal Medicine | Admitting: Internal Medicine

## 2022-02-03 DIAGNOSIS — Z66 Do not resuscitate: Secondary | ICD-10-CM | POA: Diagnosis present

## 2022-02-03 DIAGNOSIS — N281 Cyst of kidney, acquired: Secondary | ICD-10-CM | POA: Diagnosis not present

## 2022-02-03 DIAGNOSIS — K8050 Calculus of bile duct without cholangitis or cholecystitis without obstruction: Secondary | ICD-10-CM | POA: Diagnosis present

## 2022-02-03 DIAGNOSIS — Z96643 Presence of artificial hip joint, bilateral: Secondary | ICD-10-CM | POA: Diagnosis not present

## 2022-02-03 DIAGNOSIS — I509 Heart failure, unspecified: Secondary | ICD-10-CM | POA: Diagnosis not present

## 2022-02-03 DIAGNOSIS — R945 Abnormal results of liver function studies: Secondary | ICD-10-CM | POA: Diagnosis not present

## 2022-02-03 DIAGNOSIS — K7689 Other specified diseases of liver: Secondary | ICD-10-CM | POA: Diagnosis not present

## 2022-02-03 DIAGNOSIS — G9341 Metabolic encephalopathy: Secondary | ICD-10-CM | POA: Diagnosis present

## 2022-02-03 DIAGNOSIS — K5710 Diverticulosis of small intestine without perforation or abscess without bleeding: Secondary | ICD-10-CM | POA: Diagnosis not present

## 2022-02-03 DIAGNOSIS — Z888 Allergy status to other drugs, medicaments and biological substances status: Secondary | ICD-10-CM

## 2022-02-03 DIAGNOSIS — E538 Deficiency of other specified B group vitamins: Secondary | ICD-10-CM

## 2022-02-03 DIAGNOSIS — Z7901 Long term (current) use of anticoagulants: Secondary | ICD-10-CM

## 2022-02-03 DIAGNOSIS — K5750 Diverticulosis of both small and large intestine without perforation or abscess without bleeding: Secondary | ICD-10-CM | POA: Diagnosis present

## 2022-02-03 DIAGNOSIS — R140 Abdominal distension (gaseous): Secondary | ICD-10-CM | POA: Diagnosis not present

## 2022-02-03 DIAGNOSIS — F05 Delirium due to known physiological condition: Secondary | ICD-10-CM | POA: Diagnosis not present

## 2022-02-03 DIAGNOSIS — K8010 Calculus of gallbladder with chronic cholecystitis without obstruction: Secondary | ICD-10-CM | POA: Diagnosis not present

## 2022-02-03 DIAGNOSIS — R7401 Elevation of levels of liver transaminase levels: Secondary | ICD-10-CM | POA: Diagnosis not present

## 2022-02-03 DIAGNOSIS — I440 Atrioventricular block, first degree: Secondary | ICD-10-CM | POA: Diagnosis present

## 2022-02-03 DIAGNOSIS — K828 Other specified diseases of gallbladder: Secondary | ICD-10-CM | POA: Diagnosis not present

## 2022-02-03 DIAGNOSIS — I10 Essential (primary) hypertension: Secondary | ICD-10-CM | POA: Diagnosis present

## 2022-02-03 DIAGNOSIS — Z8249 Family history of ischemic heart disease and other diseases of the circulatory system: Secondary | ICD-10-CM | POA: Diagnosis not present

## 2022-02-03 DIAGNOSIS — I5041 Acute combined systolic (congestive) and diastolic (congestive) heart failure: Secondary | ICD-10-CM | POA: Diagnosis not present

## 2022-02-03 DIAGNOSIS — B179 Acute viral hepatitis, unspecified: Secondary | ICD-10-CM

## 2022-02-03 DIAGNOSIS — K8080 Other cholelithiasis without obstruction: Secondary | ICD-10-CM | POA: Diagnosis not present

## 2022-02-03 DIAGNOSIS — K5730 Diverticulosis of large intestine without perforation or abscess without bleeding: Secondary | ICD-10-CM | POA: Diagnosis not present

## 2022-02-03 DIAGNOSIS — Z886 Allergy status to analgesic agent status: Secondary | ICD-10-CM | POA: Diagnosis not present

## 2022-02-03 DIAGNOSIS — R1084 Generalized abdominal pain: Secondary | ICD-10-CM | POA: Diagnosis not present

## 2022-02-03 DIAGNOSIS — K8000 Calculus of gallbladder with acute cholecystitis without obstruction: Secondary | ICD-10-CM | POA: Diagnosis not present

## 2022-02-03 DIAGNOSIS — K8031 Calculus of bile duct with cholangitis, unspecified, with obstruction: Secondary | ICD-10-CM | POA: Diagnosis not present

## 2022-02-03 DIAGNOSIS — Z87891 Personal history of nicotine dependence: Secondary | ICD-10-CM

## 2022-02-03 DIAGNOSIS — I4891 Unspecified atrial fibrillation: Secondary | ICD-10-CM | POA: Diagnosis not present

## 2022-02-03 DIAGNOSIS — E871 Hypo-osmolality and hyponatremia: Secondary | ICD-10-CM | POA: Diagnosis not present

## 2022-02-03 DIAGNOSIS — K8051 Calculus of bile duct without cholangitis or cholecystitis with obstruction: Secondary | ICD-10-CM | POA: Diagnosis not present

## 2022-02-03 DIAGNOSIS — K819 Cholecystitis, unspecified: Secondary | ICD-10-CM | POA: Diagnosis not present

## 2022-02-03 DIAGNOSIS — R1110 Vomiting, unspecified: Secondary | ICD-10-CM | POA: Diagnosis not present

## 2022-02-03 DIAGNOSIS — K8070 Calculus of gallbladder and bile duct without cholecystitis without obstruction: Principal | ICD-10-CM

## 2022-02-03 DIAGNOSIS — I110 Hypertensive heart disease with heart failure: Secondary | ICD-10-CM | POA: Diagnosis not present

## 2022-02-03 DIAGNOSIS — I4819 Other persistent atrial fibrillation: Secondary | ICD-10-CM | POA: Diagnosis present

## 2022-02-03 DIAGNOSIS — Z79899 Other long term (current) drug therapy: Secondary | ICD-10-CM

## 2022-02-03 DIAGNOSIS — R932 Abnormal findings on diagnostic imaging of liver and biliary tract: Secondary | ICD-10-CM | POA: Diagnosis not present

## 2022-02-03 DIAGNOSIS — K8067 Calculus of gallbladder and bile duct with acute and chronic cholecystitis with obstruction: Principal | ICD-10-CM | POA: Diagnosis present

## 2022-02-03 DIAGNOSIS — K838 Other specified diseases of biliary tract: Secondary | ICD-10-CM | POA: Diagnosis not present

## 2022-02-03 DIAGNOSIS — R410 Disorientation, unspecified: Secondary | ICD-10-CM | POA: Diagnosis not present

## 2022-02-03 DIAGNOSIS — F0393 Unspecified dementia, unspecified severity, with mood disturbance: Secondary | ICD-10-CM | POA: Diagnosis present

## 2022-02-03 DIAGNOSIS — R109 Unspecified abdominal pain: Secondary | ICD-10-CM | POA: Diagnosis not present

## 2022-02-03 DIAGNOSIS — K8047 Calculus of bile duct with acute and chronic cholecystitis with obstruction: Secondary | ICD-10-CM | POA: Diagnosis not present

## 2022-02-03 DIAGNOSIS — F32A Depression, unspecified: Secondary | ICD-10-CM

## 2022-02-03 DIAGNOSIS — R9431 Abnormal electrocardiogram [ECG] [EKG]: Secondary | ICD-10-CM | POA: Diagnosis not present

## 2022-02-03 DIAGNOSIS — K835 Biliary cyst: Secondary | ICD-10-CM | POA: Diagnosis not present

## 2022-02-03 DIAGNOSIS — R933 Abnormal findings on diagnostic imaging of other parts of digestive tract: Secondary | ICD-10-CM | POA: Diagnosis not present

## 2022-02-03 LAB — CBC
HCT: 43 % (ref 36.0–46.0)
Hemoglobin: 14.1 g/dL (ref 12.0–15.0)
MCH: 31.3 pg (ref 26.0–34.0)
MCHC: 32.8 g/dL (ref 30.0–36.0)
MCV: 95.3 fL (ref 80.0–100.0)
Platelets: 155 10*3/uL (ref 150–400)
RBC: 4.51 MIL/uL (ref 3.87–5.11)
RDW: 14.6 % (ref 11.5–15.5)
WBC: 16.9 10*3/uL — ABNORMAL HIGH (ref 4.0–10.5)
nRBC: 0 % (ref 0.0–0.2)

## 2022-02-03 LAB — COMPREHENSIVE METABOLIC PANEL
ALT: 502 U/L — ABNORMAL HIGH (ref 0–44)
AST: 546 U/L — ABNORMAL HIGH (ref 15–41)
Albumin: 3.5 g/dL (ref 3.5–5.0)
Alkaline Phosphatase: 150 U/L — ABNORMAL HIGH (ref 38–126)
Anion gap: 9 (ref 5–15)
BUN: 25 mg/dL — ABNORMAL HIGH (ref 8–23)
CO2: 24 mmol/L (ref 22–32)
Calcium: 8.4 mg/dL — ABNORMAL LOW (ref 8.9–10.3)
Chloride: 101 mmol/L (ref 98–111)
Creatinine, Ser: 0.84 mg/dL (ref 0.44–1.00)
GFR, Estimated: >60 mL/min (ref >60)
Glucose, Bld: 140 mg/dL — ABNORMAL HIGH (ref 70–99)
Potassium: 3.8 mmol/L (ref 3.5–5.1)
Sodium: 134 mmol/L — ABNORMAL LOW (ref 135–145)
Total Bilirubin: 4.2 mg/dL — ABNORMAL HIGH (ref 0.3–1.2)
Total Protein: 6.5 g/dL (ref 6.5–8.1)

## 2022-02-03 LAB — LIPASE, BLOOD: Lipase: 19 U/L (ref 11–51)

## 2022-02-03 MED ORDER — PIPERACILLIN-TAZOBACTAM 3.375 G IVPB 30 MIN
3.3750 g | Freq: Once | INTRAVENOUS | Status: AC
  Administered 2022-02-03: 3.375 g via INTRAVENOUS
  Filled 2022-02-03: qty 50

## 2022-02-03 MED ORDER — ONDANSETRON HCL 4 MG PO TABS: 4.0000 mg | ORAL_TABLET | Freq: Four times a day (QID) | ORAL | Status: DC | PRN

## 2022-02-03 MED ORDER — VITAMIN D 25 MCG (1000 UNIT) PO TABS
2000.0000 [IU] | ORAL_TABLET | Freq: Every morning | ORAL | Status: DC
  Administered 2022-02-04 – 2022-02-11 (×8): 2000 [IU] via ORAL
  Filled 2022-02-03 (×8): qty 2

## 2022-02-03 MED ORDER — ONDANSETRON HCL 4 MG/2ML IJ SOLN
4.0000 mg | Freq: Four times a day (QID) | INTRAMUSCULAR | Status: DC | PRN
  Administered 2022-02-05 – 2022-02-09 (×2): 4 mg via INTRAVENOUS

## 2022-02-03 MED ORDER — ESCITALOPRAM OXALATE 20 MG PO TABS
20.0000 mg | ORAL_TABLET | Freq: Every day | ORAL | Status: DC
  Administered 2022-02-04 – 2022-02-11 (×8): 20 mg via ORAL
  Filled 2022-02-03 (×6): qty 1
  Filled 2022-02-03: qty 2
  Filled 2022-02-03: qty 1

## 2022-02-03 MED ORDER — MORPHINE SULFATE (PF) 2 MG/ML IV SOLN
2.0000 mg | INTRAVENOUS | Status: DC | PRN
  Administered 2022-02-09 (×2): 2 mg via INTRAVENOUS
  Filled 2022-02-03 (×2): qty 1

## 2022-02-03 MED ORDER — ENOXAPARIN SODIUM 40 MG/0.4ML IJ SOSY: 40.0000 mg | PREFILLED_SYRINGE | INTRAMUSCULAR | Status: DC

## 2022-02-03 MED ORDER — TRAZODONE HCL 50 MG PO TABS
25.0000 mg | ORAL_TABLET | Freq: Every evening | ORAL | Status: DC | PRN
  Administered 2022-02-07 – 2022-02-10 (×2): 25 mg via ORAL
  Filled 2022-02-03 (×2): qty 1

## 2022-02-03 MED ORDER — VITAMIN B-12 1000 MCG PO TABS
1000.0000 ug | ORAL_TABLET | Freq: Every day | ORAL | Status: DC
Start: 2022-02-04 — End: 2022-02-11
  Administered 2022-02-04 – 2022-02-11 (×8): 1000 ug via ORAL
  Filled 2022-02-03 (×8): qty 1

## 2022-02-03 MED ORDER — MAGNESIUM HYDROXIDE 400 MG/5ML PO SUSP: 30.0000 mL | Freq: Every day | ORAL | Status: DC | PRN

## 2022-02-03 MED ORDER — DILTIAZEM HCL ER COATED BEADS 180 MG PO CP24
300.0000 mg | ORAL_CAPSULE | Freq: Every day | ORAL | Status: DC
  Administered 2022-02-04 – 2022-02-11 (×8): 300 mg via ORAL
  Filled 2022-02-03 (×8): qty 1

## 2022-02-03 MED ORDER — MIRABEGRON ER 25 MG PO TB24
25.0000 mg | ORAL_TABLET | Freq: Every day | ORAL | Status: DC
  Administered 2022-02-04 – 2022-02-11 (×8): 25 mg via ORAL
  Filled 2022-02-03 (×8): qty 1

## 2022-02-03 MED ORDER — ACETAMINOPHEN 650 MG RE SUPP: 650.0000 mg | Freq: Four times a day (QID) | RECTAL | Status: DC | PRN

## 2022-02-03 MED ORDER — PRESERVISION/LUTEIN PO CAPS: 1.0000 | ORAL_CAPSULE | Freq: Two times a day (BID) | ORAL | Status: DC

## 2022-02-03 MED ORDER — AMIODARONE HCL 200 MG PO TABS
100.0000 mg | ORAL_TABLET | ORAL | Status: DC
  Administered 2022-02-06 – 2022-02-10 (×3): 100 mg via ORAL
  Filled 2022-02-03 (×4): qty 1

## 2022-02-03 MED ORDER — SODIUM CHLORIDE 0.9 % IV BOLUS
1000.0000 mL | Freq: Once | INTRAVENOUS | Status: AC
  Administered 2022-02-03: 1000 mL via INTRAVENOUS

## 2022-02-03 MED ORDER — ACETAMINOPHEN 325 MG PO TABS
650.0000 mg | ORAL_TABLET | Freq: Four times a day (QID) | ORAL | Status: DC | PRN
  Administered 2022-02-10: 650 mg via ORAL
  Filled 2022-02-03: qty 2

## 2022-02-03 MED ORDER — TETRAHYDROZOLINE HCL 0.05 % OP SOLN: 2.0000 [drp] | OPHTHALMIC | Status: DC | PRN

## 2022-02-03 MED ORDER — IOHEXOL 300 MG/ML  SOLN
100.0000 mL | Freq: Once | INTRAMUSCULAR | Status: AC | PRN
  Administered 2022-02-03: 100 mL via INTRAVENOUS

## 2022-02-03 MED ORDER — SODIUM CHLORIDE 0.9 % IV SOLN: INTRAVENOUS | Status: DC

## 2022-02-03 MED ORDER — IPRATROPIUM BROMIDE 0.03 % NA SOLN
1.0000 | NASAL | Status: DC | PRN
Start: 2022-02-03 — End: 2022-02-11

## 2022-02-03 NOTE — Consult Note (Signed)
Reason for Consult: Cholelithiasis, cholecystitis, choledocholithiasis with biliary obstruction  Referring Physician: Dr. Charm Barges, [facility] ER  Stephanie Ritter is an 88 y.o. female.   HPI: Patient is a 88 year old female who presents to the emergency department with abdominal pain.  She is accompanied by her daughter and son-in-law.  Patient has dementia and short-term memory loss.  Evaluation in the emergency department includes laboratory studies showed an elevated white blood cell count of 16.9.  Liver enzymes show an elevated total bilirubin of 4.2 and transaminases greater than 500.  Lipase was normal.  Ultrasound and CT scan of the abdomen and pelvis were obtained.  This shows cholelithiasis and gallbladder sludge and likely cholecystitis.  There is also both intrahepatic and extrahepatic biliary dilatation and a probable cholelith obstructing the distal common bile duct near the ampulla.  Concern was raised over cholangitis.  Patient has history of cardiac arrhythmia and is on chronic anticoagulation with Eliquis.  Past Medical History:  Diagnosis Date   CHF (congestive heart failure) (HCC)    Hypertension    Persistent atrial fibrillation (HCC)    Tinnitus     Past Surgical History:  Procedure Laterality Date   APPENDECTOMY     60 years ago   ATRIAL FIBRILLATION ABLATION N/A    8 years ago   BOWEL RESECTION N/A 08/07/2018   Procedure: SMALL BOWEL RESECTION;  Surgeon: Berna Bue, MD;  Location: [facility] OR;  Service: General;  Laterality: N/A;   hip replacement Bilateral    4 and 8 years ago   INGUINAL HERNIA REPAIR Right 08/07/2018   Procedure: Laparoscopic right femoral hernia repair with mesh, possible open, possible bowel resection;  Surgeon: Berna Bue, MD;  Location: [facility] OR;  Service: General;  Laterality: Right;    Family History  Family history unknown: Yes    Social History:  reports that she has quit smoking. Her smoking use included cigarettes. She  has never used smokeless tobacco. She reports current alcohol use of about 1.0 standard drink of alcohol per week. She reports that she does not use drugs.  Allergies:  Allergies  Allergen Reactions   Asa [Aspirin] Other (See Comments)    Excessive bleeding   Nsaids Other (See Comments)    Bleeding, ulcers   Mirtazapine Other (See Comments)    Very groggy    Medications: I have reviewed the patient's current medications.  Results for orders placed or performed during the hospital encounter of 02/03/22 (from the past 48 hour(s))  Lipase, blood     Status: None   Collection Time: 02/03/22  2:17 PM  Result Value Ref Range   Lipase 19 11 - 51 U/L    Comment: Performed at [facility], [address], [place], [place] [address]  Comprehensive metabolic panel     Status: Abnormal   Collection Time: 02/03/22  2:17 PM  Result Value Ref Range   Sodium 134 (L) 135 - 145 mmol/L   Potassium 3.8 3.5 - 5.1 mmol/L   Chloride 101 98 - 111 mmol/L   CO2 24 22 - 32 mmol/L   Glucose, Bld 140 (H) 70 - 99 mg/dL    Comment: Glucose reference range applies only to samples taken after fasting for at least 8 hours.   BUN 25 (H) 8 - 23 mg/dL   Creatinine, Ser 7.37 0.44 - 1.00 mg/dL   Calcium 8.4 (L) 8.9 - 10.3 mg/dL   Total Protein 6.5 6.5 - 8.1 g/dL  Albumin 3.5 3.5 - 5.0 g/dL   AST 546 (H) 15 - 41 U/L   ALT 502 (H) 0 - 44 U/L   Alkaline Phosphatase 150 (H) 38 - 126 U/L   Total Bilirubin 4.2 (H) 0.3 - 1.2 mg/dL   GFR, Estimated >60 >60 mL/min    Comment: (NOTE) Calculated using the CKD-EPI Creatinine Equation (2021)    Anion gap 9 5 - 15    Comment: Performed at [facility], [place] [address], [place], [place] [address]  CBC     Status: Abnormal   Collection Time: 02/03/22  2:17 PM  Result Value Ref Range   WBC 16.9 (H) 4.0 - 10.5 K/uL   RBC 4.51 3.87 - 5.11 MIL/uL   Hemoglobin 14.1 12.0 - 15.0 g/dL   HCT 43.0 36.0 - 46.0 %   MCV 95.3 80.0 - 100.0 fL    MCH 31.3 26.0 - 34.0 pg   MCHC 32.8 30.0 - 36.0 g/dL   RDW 14.6 11.5 - 15.5 %   Platelets 155 150 - 400 K/uL   nRBC 0.0 0.0 - 0.2 %    Comment: Performed at [facility], [place] [address], [place], [place] [address]    US Abdomen Limited RUQ (LIVER/GB)  Result Date: 02/03/2022 CLINICAL DATA:  Elevated LFT EXAM: ULTRASOUND ABDOMEN LIMITED RIGHT UPPER QUADRANT COMPARISON:  CT 02/03/2022 FINDINGS: Gallbladder: Markedly dilated. Moderate sludge within the gallbladder. No shadowing stones. Echogenic area within the lumen of the gallbladder containing vascularity suggesting solid tissue. Negative sonographic Murphy. Common bile duct: Diameter: 13 mm Liver: Positive for intra hepatic biliary dilatation. No focal hepatic abnormality. Portal vein is patent on color Doppler imaging with normal direction of blood flow towards the liver. Other: None. IMPRESSION: 1. Severe gallbladder distension to the level of right lower quadrant. Hyper and hypoechoic material is present within the gallbladder lumen, this appears to contain vascularity and raises concern for gallbladder mass lesion. Surgical consultation is recommended. Negative sonographic Murphy 2. Severe intra and extrahepatic biliary dilatation. Electronically Signed   By: Donavan Foil M.D.   On: 02/03/2022 18:20   CT Abdomen Pelvis W Contrast  Result Date: 02/03/2022 CLINICAL DATA:  Abdominal pain, nausea, vomiting, elevated liver enzymes EXAM: CT ABDOMEN AND PELVIS WITH CONTRAST TECHNIQUE: Multidetector CT imaging of the abdomen and pelvis was performed using the standard protocol following bolus administration of intravenous contrast. RADIATION DOSE REDUCTION: This exam was performed according to the departmental dose-optimization program which includes automated exposure control, adjustment of the mA and/or kV according to patient size and/or use of iterative reconstruction technique. CONTRAST:  125mL OMNIPAQUE IOHEXOL 300 MG/ML  SOLN  COMPARISON:  01/06/2019 FINDINGS: Lower chest: Cardiomegaly. Coronary artery calcifications. Trace bilateral pleural effusions. Irregular scarring and or atelectasis in the bilateral lung bases. Hepatobiliary: No solid liver abnormality is seen. Severe intra and extrahepatic biliary ductal dilatation, central common bile duct measuring up to 1.7 cm in caliber. There is a faintly calcified calculus near the ampulla measuring 0.9 cm (series 4, image 24). Faintly calcified sludge and or additional gallstones in the gallbladder. Pancreas: Unremarkable. No pancreatic ductal dilatation or surrounding inflammatory changes. Spleen: Normal in size without significant abnormality. Adrenals/Urinary Tract: Adrenal glands are unremarkable. Simple, benign bilateral renal cortical cysts, for which no further follow-up or characterization is required. Kidneys are otherwise normal, without renal calculi, solid lesion, or hydronephrosis. Bladder is unremarkable. Stomach/Bowel: Stomach is within normal limits. Appendix not clearly visualized. No evidence of bowel wall thickening, distention,  or inflammatory changes. Severe pancolonic diverticulosis. Vascular/Lymphatic: Aortic atherosclerosis. No enlarged abdominal or pelvic lymph nodes. Reproductive: No mass or other significant abnormality. Other: No abdominal wall hernia or abnormality. No ascites. Musculoskeletal: No acute or significant osseous findings. Status post bilateral hip arthroplasty. IMPRESSION: 1. Severe intra and extrahepatic biliary ductal dilatation, central common bile duct measuring up to 1.7 cm in caliber. There is a faintly calcified calculus near the ampulla measuring 0.9 cm. Faintly calcified sludge and or additional gallstones in the distended gallbladder. Findings are consistent with choledocholithiasis. 2. Severe pancolonic diverticulosis without evidence of acute diverticulitis. 3. Cardiomegaly and coronary artery disease. 4. Trace bilateral pleural  effusions. Aortic Atherosclerosis (ICD10-I70.0). Electronically Signed   By: Delanna Ahmadi M.D.   On: 02/03/2022 16:06    Review of Systems  Unable to perform ROS: Dementia  All other systems reviewed and are negative.   Physical Exam  Blood pressure 117/63, pulse 78, temperature 97.9 F (36.6 C), temperature source Oral, resp. rate 16, SpO2 94 %.  CONSTITUTIONAL: no acute distress; conversant; no obvious deformities  EYES: Conjunctiva clear and moist; pupils equal bilaterally  NECK: trachea midline; no thyroid nodularity  LUNGS: respiratory effort normal & unlabored; no wheeze; no rales  CV: rate and rhythm regular; no significant murmur; mild 2+ edema bilat lower extremities  GI: abdomen is soft and slightly protuberant; palpation shows mild to moderate tenderness in the right upper quadrant and right lateral abdomen with a palpable gallbladder.  There are no obvious surgical incisions.  Bowel sounds are present to auscultation.  MSK: normal range of motion of extremities; no clubbing; no cyanosis  PSYCH: Patient is pleasant but cannot provide history  LYMPHATIC: no palpable cervical lymphadenopathy    Assessment/Plan:  Cholecystitis, cholelithiasis, choledocholithiasis with distal biliary obstruction Chronic anticoagulation on Eliquis Atrial fibrillation Dementia  Patient will be admitted to the medical service to [facility].  She should be started on empiric antibiotic therapy due to cholecystitis and possible developing cholangitis.  Consult should be made to gastroenterology for evaluation for possible ERCP with sphincterotomy and stone extraction once anticoagulation has reversed.  Alternatively we could consider intervention by interventional radiology for PTC placement and drainage.  Patient will need assessment by cardiology with an assessment of operative risk if we were to consider cholecystectomy.  Also to be considered would be ERCP with  sphincterotomy with relief of symptoms and hoping that any additional choleliths which reached the common bile duct with then passed through the sphincterotomy, therefore avoiding the need for general anesthesia and surgical intervention.  I discussed this plan with the patient's daughter and her husband at the bedside.  We will await the evaluation of [facility], gastroenterology, and cardiology before making any further decisions on management.  General surgery will continue to follow this patient closely.  Discussed with Dr. Melina Copa in the emergency department.  Armandina Gemma, [facility] [place] [facility] Office: [place] 02/03/2022, 11:18 PM

## 2022-02-03 NOTE — ED Provider Notes (Signed)
[facility] DEPT Provider Note   CSN: [id number] Arrival date & time: 02/03/22  1338     History {Add pertinent medical, surgical, social history, OB history to HPI:1} Chief Complaint  Patient presents with   Abdominal Pain    Stephanie Ritter is a 88 y.o. female.  She has a history of A-fib on Eliquis.  She started having some generalized abdominal pain last evening with a few episodes of vomiting.  She seemed to rest overnight but the pain recurred again this morning.  Went to PCP who had lab work done showing her liver functions were elevated.  No fever no continued vomiting.  Patient endorses some mild abdominal soreness.  Level 5 caveat secondary to dementia.  Daughter is providing most of the history.  The history is provided by the patient and a relative.  Abdominal Pain Pain location:  Generalized Pain severity:  Moderate Onset quality:  Sudden Duration:  24 hours Timing:  Intermittent Progression:  Unchanged Chronicity:  New Relieved by:  None tried Worsened by:  Nothing Ineffective treatments:  None tried Associated symptoms: nausea and vomiting   Associated symptoms: no chest pain, no fever and no shortness of breath        Home Medications Prior to Admission medications   Medication Sig Start Date End Date Taking? Authorizing Provider  acetaminophen (TYLENOL) 500 MG tablet 1 tablet as needed 02/01/18   [provider]  amiodarone (PACERONE) 200 MG tablet Take 0.5 tablets (100 mg total) by mouth every Monday, Wednesday, and Friday. 08/31/21   Sherran Needs, NP  Carboxymethylcellulose Sodium (EYE DROPS OP) Place 2 drops into both eyes as needed (for dry eyes).     [provider]  Cholecalciferol (VITAMIN D3) 25 MCG (1000 UT) CAPS Take 2 capsules by mouth every morning. 02/06/19   [provider]  Cyanocobalamin (VITAMIN B12) 1000 MCG TBCR Taking one tablet by mouth every other day 05/22/19   [provider]  diltiazem (CARDIZEM CD) 300 MG 24 hr capsule Take 300 mg by mouth daily. 07/28/21   [provider]  ELIQUIS 5 MG TABS tablet TAKE 1 TABLET BY MOUTH TWICE  DAILY 12/05/21   Sherran Needs, NP  escitalopram (LEXAPRO) 20 MG tablet Take 20 mg by mouth daily.  12/27/16   [provider]  ipratropium (ATROVENT) 0.03 % nasal spray Place 1 spray into both nostrils as needed.  03/17/19   [provider]  Multiple Vitamins-Minerals (PRESERVISION/LUTEIN) CAPS Take 1 capsule by mouth 2 (two) times daily.     [provider]  MYRBETRIQ 25 MG TB24 tablet Take 1 tablet by mouth daily. 01/03/19   [provider]      Allergies    Asa [aspirin], Nsaids, and Mirtazapine    Review of Systems   Review of Systems  Constitutional:  Negative for fever.  Respiratory:  Negative for shortness of breath.   Cardiovascular:  Negative for chest pain.  Gastrointestinal:  Positive for abdominal pain, nausea and vomiting.  Neurological:  Negative for headaches.    Physical Exam Updated Vital Signs BP 106/66 (BP Location: Left Arm)   Pulse 82   Temp 97.9 F (36.6 C) (Oral)   Resp 15   SpO2 92%  Physical Exam Vitals and nursing note reviewed.  Constitutional:      General: She is not in acute distress.    Appearance: Normal appearance. She is well-developed.  HENT:     Head: Normocephalic and  atraumatic.  Eyes:     Conjunctiva/sclera: Conjunctivae normal.  Cardiovascular:     Rate and Rhythm: Normal rate and regular rhythm.     Heart sounds: No murmur heard. Pulmonary:     Effort: Pulmonary effort is normal. No respiratory distress.     Breath sounds: Normal breath sounds.  Abdominal:     Palpations: Abdomen is soft.     Tenderness: There is abdominal tenderness. There is no guarding or rebound.  Musculoskeletal:     Cervical back: Neck supple.     Right lower leg: No edema.     Left lower leg: No edema.  Skin:    General: Skin is warm and  dry.     Capillary Refill: Capillary refill takes less than 2 seconds.  Neurological:     General: No focal deficit present.     Mental Status: She is alert. She is disoriented.     ED Results / Procedures / Treatments   Labs (all labs ordered are listed, but only abnormal results are displayed) Labs Reviewed  COMPREHENSIVE METABOLIC PANEL - Abnormal; Notable for the following components:      Result Value   Sodium 134 (*)    Glucose, Bld 140 (*)    BUN 25 (*)    Calcium 8.4 (*)    AST 546 (*)    ALT 502 (*)    Alkaline Phosphatase 150 (*)    Total Bilirubin 4.2 (*)    All other components within normal limits  CBC - Abnormal; Notable for the following components:   WBC 16.9 (*)    All other components within normal limits  URINE CULTURE  LIPASE, BLOOD  URINALYSIS, ROUTINE W REFLEX MICROSCOPIC    EKG None  Radiology US Abdomen Limited RUQ (LIVER/GB)  Result Date: 02/03/2022 CLINICAL DATA:  Elevated LFT EXAM: ULTRASOUND ABDOMEN LIMITED RIGHT UPPER QUADRANT COMPARISON:  CT 02/03/2022 FINDINGS: Gallbladder: Markedly dilated. Moderate sludge within the gallbladder. No shadowing stones. Echogenic area within the lumen of the gallbladder containing vascularity suggesting solid tissue. Negative sonographic Murphy. Common bile duct: Diameter: 13 mm Liver: Positive for intra hepatic biliary dilatation. No focal hepatic abnormality. Portal vein is patent on color Doppler imaging with normal direction of blood flow towards the liver. Other: None. IMPRESSION: 1. Severe gallbladder distension to the level of right lower quadrant. Hyper and hypoechoic material is present within the gallbladder lumen, this appears to contain vascularity and raises concern for gallbladder mass lesion. Surgical consultation is recommended. Negative sonographic Murphy 2. Severe intra and extrahepatic biliary dilatation. Electronically Signed   By: Donavan Foil M.D.   On: 02/03/2022 18:20   CT Abdomen Pelvis W  Contrast  Result Date: 02/03/2022 CLINICAL DATA:  Abdominal pain, nausea, vomiting, elevated liver enzymes EXAM: CT ABDOMEN AND PELVIS WITH CONTRAST TECHNIQUE: Multidetector CT imaging of the abdomen and pelvis was performed using the standard protocol following bolus administration of intravenous contrast. RADIATION DOSE REDUCTION: This exam was performed according to the departmental dose-optimization program which includes automated exposure control, adjustment of the mA and/or kV according to patient size and/or use of iterative reconstruction technique. CONTRAST:  122m OMNIPAQUE IOHEXOL 300 MG/ML  SOLN COMPARISON:  01/06/2019 FINDINGS: Lower chest: Cardiomegaly. Coronary artery calcifications. Trace bilateral pleural effusions. Irregular scarring and or atelectasis in the bilateral lung bases. Hepatobiliary: No solid liver abnormality is seen. Severe intra and extrahepatic biliary ductal dilatation, central common bile duct measuring up to 1.7 cm in caliber. There is a faintly calcified calculus  near the ampulla measuring 0.9 cm (series 4, image 24). Faintly calcified sludge and or additional gallstones in the gallbladder. Pancreas: Unremarkable. No pancreatic ductal dilatation or surrounding inflammatory changes. Spleen: Normal in size without significant abnormality. Adrenals/Urinary Tract: Adrenal glands are unremarkable. Simple, benign bilateral renal cortical cysts, for which no further follow-up or characterization is required. Kidneys are otherwise normal, without renal calculi, solid lesion, or hydronephrosis. Bladder is unremarkable. Stomach/Bowel: Stomach is within normal limits. Appendix not clearly visualized. No evidence of bowel wall thickening, distention, or inflammatory changes. Severe pancolonic diverticulosis. Vascular/Lymphatic: Aortic atherosclerosis. No enlarged abdominal or pelvic lymph nodes. Reproductive: No mass or other significant abnormality. Other: No abdominal wall hernia or  abnormality. No ascites. Musculoskeletal: No acute or significant osseous findings. Status post bilateral hip arthroplasty. IMPRESSION: 1. Severe intra and extrahepatic biliary ductal dilatation, central common bile duct measuring up to 1.7 cm in caliber. There is a faintly calcified calculus near the ampulla measuring 0.9 cm. Faintly calcified sludge and or additional gallstones in the distended gallbladder. Findings are consistent with choledocholithiasis. 2. Severe pancolonic diverticulosis without evidence of acute diverticulitis. 3. Cardiomegaly and coronary artery disease. 4. Trace bilateral pleural effusions. Aortic Atherosclerosis (ICD10-I70.0). Electronically Signed   By: Delanna Ahmadi M.D.   On: 02/03/2022 16:06    Procedures Procedures  {Document cardiac monitor, telemetry assessment procedure when appropriate:1}  Medications Ordered in ED Medications  sodium chloride 0.9 % bolus 1,000 mL (has no administration in time range)  iohexol (OMNIPAQUE) 300 MG/ML solution 100 mL (100 mLs Intravenous Contrast Given 02/03/22 1551)    ED Course/ Medical Decision Making/ A&P                           Medical Decision Making Amount and/or Complexity of Data Reviewed Labs: ordered. Radiology: ordered.   This patient complains of ***; this involves an extensive number of treatment Options and is a complaint that carries with it a high risk of complications and morbidity. The differential includes ***  I ordered, reviewed and interpreted labs, which included *** I ordered medication *** and reviewed PMP when indicated. I ordered imaging studies which included *** and I independently    visualized and interpreted imaging which showed *** Additional history obtained from *** Previous records obtained and reviewed *** I consulted *** and discussed lab and imaging findings and discussed disposition.  Cardiac monitoring reviewed, *** Social determinants considered, *** Critical Interventions:  ***  After the interventions stated above, I reevaluated the patient and found *** Admission and further testing considered, ***   {Document critical care time when appropriate:1} {Document review of labs and clinical decision tools ie heart score, Chads2Vasc2 etc:1}  {Document your independent review of radiology images, and any outside records:1} {Document your discussion with family members, caretakers, and with consultants:1} {Document social determinants of health affecting pt's care:1} {Document your decision making why or why not admission, treatments were needed:1} Final Clinical Impression(s) / ED Diagnoses Final diagnoses:  None    Rx / DC Orders ED Discharge Orders     None

## 2022-02-03 NOTE — ED Triage Notes (Signed)
Pt reports abdominal pain and N/V that began last night. Pt went to PCP today and was told liver enzymes were elevated and she needed to come to ER for further evaluation. Pt endorses mild abdominal pain, nausea, and weakness at this time.

## 2022-02-03 NOTE — Progress Notes (Signed)
Pharmacy Antibiotic Note  Stephanie Ritter is a 88 y.o. female admitted on 02/03/2022 with cholecystitis and possible cholangitis.  Pharmacy has been consulted for Zosyn dosing.  Plan: Zosyn 3.375g IV q8h (4 hour infusion). Need for further dosage adjustment appears unlikely at present.    Will sign off at this time.  Please reconsult if a change in clinical status warrants re-evaluation of dosage.      Temp (24hrs), Avg:98.1 F (36.7 C), Min:97.9 F (36.6 C), Max:98.3 F (36.8 C)  Recent Labs  Lab 02/03/22 1417  WBC 16.9*  CREATININE 0.84    CrCl cannot be calculated (Unknown ideal weight.).    Allergies  Allergen Reactions   Asa [Aspirin] Other (See Comments)    Excessive bleeding   Nsaids Other (See Comments)    Bleeding, ulcers   Mirtazapine Other (See Comments)    Very groggy       Thank you for allowing pharmacy to be a part of this patient's care.  Everette Rank, PharmD 02/04/2022 2:25 AM

## 2022-02-03 NOTE — ED Provider Triage Note (Signed)
Emergency Medicine Provider Triage Evaluation Note  Stephanie Ritter , a 88 y.o. female  was evaluated in triage.  Pt complains of abdominal pain.  Patient's daughter is with her as primary story and given patient's baseline cognitive status.  Patient's daughter states that they have been around 8 PM last night, and patient began to have abdominal pain in episodes of vomiting around midnight.  She has had multiple episodes of vomiting and feelings of nausea since her symptoms began.  She was seen by her primary care provider earlier this morning and was told to come to the emergency department.  Denies fever, chills, night sweats, chest pain, shortness of breath, urinary/vaginal symptoms, change in bowel habits..  Review of Systems  Positive:  Negative: See above  Physical Exam  BP 140/67 (BP Location: Left Arm)   Pulse 80   Temp 98 F (36.7 C) (Oral)   Resp 14   SpO2 95%  Gen:   Awake, no distress   Resp:  Normal effort  MSK:   Moves extremities without difficulty  Other:  Tender to palpation of epigastric/right upper quadrant region.  Medical Decision Making  Medically screening exam initiated at 2:07 PM.  Appropriate orders placed.  Derl Barrow Crow was informed that the remainder of the evaluation will be completed by another provider, this initial triage assessment does not replace that evaluation, and the importance of remaining in the ED until their evaluation is complete.     Wilnette Kales, [place] 02/03/22 1408

## 2022-02-04 DIAGNOSIS — B179 Acute viral hepatitis, unspecified: Secondary | ICD-10-CM

## 2022-02-04 DIAGNOSIS — K828 Other specified diseases of gallbladder: Secondary | ICD-10-CM

## 2022-02-04 DIAGNOSIS — E538 Deficiency of other specified B group vitamins: Secondary | ICD-10-CM

## 2022-02-04 DIAGNOSIS — F32A Depression, unspecified: Secondary | ICD-10-CM

## 2022-02-04 DIAGNOSIS — K8050 Calculus of bile duct without cholangitis or cholecystitis without obstruction: Secondary | ICD-10-CM | POA: Diagnosis not present

## 2022-02-04 LAB — COMPREHENSIVE METABOLIC PANEL
ALT: 329 U/L — ABNORMAL HIGH (ref 0–44)
AST: 256 U/L — ABNORMAL HIGH (ref 15–41)
Albumin: 3.2 g/dL — ABNORMAL LOW (ref 3.5–5.0)
Alkaline Phosphatase: 133 U/L — ABNORMAL HIGH (ref 38–126)
Anion gap: 11 (ref 5–15)
BUN: 27 mg/dL — ABNORMAL HIGH (ref 8–23)
CO2: 22 mmol/L (ref 22–32)
Calcium: 8 mg/dL — ABNORMAL LOW (ref 8.9–10.3)
Chloride: 103 mmol/L (ref 98–111)
Creatinine, Ser: 1.13 mg/dL — ABNORMAL HIGH (ref 0.44–1.00)
GFR, Estimated: 45 mL/min — ABNORMAL LOW (ref >60)
Glucose, Bld: 92 mg/dL (ref 70–99)
Potassium: 3.6 mmol/L (ref 3.5–5.1)
Sodium: 136 mmol/L (ref 135–145)
Total Bilirubin: 4.9 mg/dL — ABNORMAL HIGH (ref 0.3–1.2)
Total Protein: 6.2 g/dL — ABNORMAL LOW (ref 6.5–8.1)

## 2022-02-04 LAB — CBC
HCT: 39.3 % (ref 36.0–46.0)
Hemoglobin: 13 g/dL (ref 12.0–15.0)
MCH: 31.2 pg (ref 26.0–34.0)
MCHC: 33.1 g/dL (ref 30.0–36.0)
MCV: 94.2 fL (ref 80.0–100.0)
Platelets: 104 10*3/uL — ABNORMAL LOW (ref 150–400)
RBC: 4.17 MIL/uL (ref 3.87–5.11)
RDW: 15.1 % (ref 11.5–15.5)
WBC: 17.8 10*3/uL — ABNORMAL HIGH (ref 4.0–10.5)
nRBC: 0 % (ref 0.0–0.2)

## 2022-02-04 MED ORDER — ENOXAPARIN SODIUM 40 MG/0.4ML IJ SOSY
40.0000 mg | PREFILLED_SYRINGE | INTRAMUSCULAR | Status: DC
  Administered 2022-02-04 – 2022-02-08 (×4): 40 mg via SUBCUTANEOUS
  Filled 2022-02-04 (×5): qty 0.4

## 2022-02-04 MED ORDER — PIPERACILLIN-TAZOBACTAM 3.375 G IVPB
3.3750 g | Freq: Three times a day (TID) | INTRAVENOUS | Status: DC
  Administered 2022-02-04 – 2022-02-09 (×16): 3.375 g via INTRAVENOUS
  Filled 2022-02-04 (×16): qty 50

## 2022-02-04 MED ORDER — SODIUM CHLORIDE 0.9 % IV SOLN: INTRAVENOUS | Status: DC

## 2022-02-04 MED ORDER — PROSIGHT PO TABS
1.0000 | ORAL_TABLET | Freq: Every day | ORAL | Status: DC
  Administered 2022-02-04 – 2022-02-11 (×8): 1 via ORAL
  Filled 2022-02-04 (×8): qty 1

## 2022-02-04 NOTE — Assessment & Plan Note (Addendum)
-   This is likely the main culprit for her abdominal pain that is mainly biliary colic. - The patient will be admitted to a medical telemetry bed. - We will keep her n.p.o. - She will be hydrated with IV normal saline. - We will continue IV Zosyn for now. - GI consultation will be obtained in a.m. - Dr. Paulita Fujita is aware about the patient. - We will follow LFTs. - She may need ERCP. - We will defer further work-up to our gastroenterologist.

## 2022-02-04 NOTE — Assessment & Plan Note (Signed)
-   General surgery was notified by the ER physician and consult will be obtained in a.m.

## 2022-02-04 NOTE — H&P (View-Only) (Signed)
[facility] Consultation Note  Referring Provider: [facility] Primary Care Physician:  Lajean Manes, MD  Reason for Consultation:  abdominal pain, elevated LFTs  HPI: Stephanie Ritter is a 88 y.o. female with chronic memory impairment and history of atrial fibrillation on Eliquis. Much of history is provided by daughter.  Patient with two-day history of upper abdominal pain.  Found to have elevated LFTs.  Has gallstones and biliary ductal dilatation and stone in distal CBD (on CT scan) near ampulla. No fevers or blood in stool.  No prior episodes of this type of pain.   Past Medical History:  Diagnosis Date   CHF (congestive heart failure) (HCC)    Hypertension    Persistent atrial fibrillation (HCC)    Tinnitus     Past Surgical History:  Procedure Laterality Date   APPENDECTOMY     60 years ago   ATRIAL FIBRILLATION ABLATION N/A    8 years ago   BOWEL RESECTION N/A 08/07/2018   Procedure: SMALL BOWEL RESECTION;  Surgeon: Clovis Riley, MD;  Location: [place];  Service: General;  Laterality: N/A;   hip replacement Bilateral    4 and 8 years ago   [place] Right 08/07/2018   Procedure: Laparoscopic right femoral hernia repair with mesh, possible open, possible bowel resection;  Surgeon: Clovis Riley, MD;  Location: [place];  Service: General;  Laterality: Right;    Prior to Admission medications   Medication Sig Start Date End Date Taking? Authorizing Provider  acetaminophen (TYLENOL) 500 MG tablet Take 1,000 mg by mouth daily as needed (for pain). 02/01/18  Yes [provider]  amiodarone (PACERONE) 200 MG tablet Take 0.5 tablets (100 mg total) by mouth every Monday, Wednesday, and Friday. 08/31/21  Yes Sherran Needs, NP  Carboxymethylcellulose Sodium (EYE DROPS OP) Place 2 drops into both eyes as needed (for dry eyes).    Yes [provider]  Cholecalciferol (VITAMIN D3) 25 MCG (1000 UT) CAPS Take 1,000 Units by mouth  See admin instructions. Take 1 capsule by mouth four times a week 02/06/19  Yes [provider]  Cyanocobalamin (VITAMIN B12) 1000 MCG TBCR Take 1,000 mcg by mouth daily. 05/22/19  Yes [provider]  diltiazem (CARDIZEM CD) 300 MG 24 hr capsule Take 300 mg by mouth daily. 07/28/21  Yes [provider]  ELIQUIS 5 MG TABS tablet TAKE 1 TABLET BY MOUTH TWICE  DAILY Patient taking differently: Take 5 mg by mouth 2 (two) times daily. 12/05/21  Yes Sherran Needs, NP  escitalopram (LEXAPRO) 20 MG tablet Take 20 mg by mouth daily.  12/27/16  Yes [provider]  Multiple Vitamins-Minerals (PRESERVISION/LUTEIN) CAPS Take 1 capsule by mouth 2 (two) times daily.    Yes [provider]  MYRBETRIQ 25 MG TB24 tablet Take 25 mg by mouth daily. 01/03/19  Yes [provider]  ipratropium (ATROVENT) 0.03 % nasal spray Place 1 spray into both nostrils as needed.  Patient not taking: Reported on 02/03/2022 03/17/19   [provider]    Current Facility-Administered Medications  Medication Dose Route Frequency Provider Last Rate Last Admin   0.9 %  sodium chloride infusion   Intravenous Continuous Mansy, Jan A, MD 100 mL/hr at 02/04/22 1015 Infusion Verify at 02/04/22 1015   acetaminophen (TYLENOL) tablet 650 mg  650 mg Oral Q6H PRN Mansy, Jan A, MD       Or   acetaminophen (TYLENOL) suppository 650 mg  650 mg  Rectal Q6H PRN Mansy, Arvella Merles, MD       [START ON 02/06/2022] amiodarone (PACERONE) tablet 100 mg  100 mg Oral Q M,W,F Mansy, Jan A, MD       cholecalciferol (VITAMIN D3) tablet 2,000 Units  2,000 Units Oral q morning Mansy, Jan A, MD   2,000 Units at 02/04/22 0920   diltiazem (CARDIZEM CD) 24 hr capsule 300 mg  300 mg Oral Daily Mansy, Jan A, MD   300 mg at 02/04/22 0920   enoxaparin (LOVENOX) injection 40 mg  40 mg Subcutaneous Q24H Mansy, Jan A, MD   40 mg at 02/04/22 0919   escitalopram (LEXAPRO) tablet 20 mg  20 mg Oral Daily Mansy, Jan A, MD   20 mg  at 02/04/22 1478   ipratropium (ATROVENT) 0.03 % nasal spray 1 spray  1 spray Each Nare PRN Mansy, Jan A, MD       magnesium hydroxide (MILK OF MAGNESIA) suspension 30 mL  30 mL Oral Daily PRN Mansy, Jan A, MD       mirabegron ER (MYRBETRIQ) tablet 25 mg  25 mg Oral Daily Mansy, Jan A, MD   25 mg at 02/04/22 0920   morphine (PF) 2 MG/ML injection 2 mg  2 mg Intravenous Q4H PRN Mansy, Jan A, MD       multivitamin (PROSIGHT) tablet 1 tablet  1 tablet Oral Daily Mansy, Jan A, MD   1 tablet at 02/04/22 0920   ondansetron (ZOFRAN) tablet 4 mg  4 mg Oral Q6H PRN Mansy, Jan A, MD       Or   ondansetron [facility]) injection 4 mg  4 mg Intravenous Q6H PRN Mansy, Jan A, MD       piperacillin-tazobactam (ZOSYN) IVPB 3.375 g  3.375 g Intravenous Q8H Poindexter, Leann T, RPH 12.5 mL/hr at 02/04/22 0810 3.375 g at 02/04/22 0810   tetrahydrozoline 0.05 % ophthalmic solution 2 drop  2 drop Both Eyes PRN Mansy, Jan A, MD       traZODone (DESYREL) tablet 25 mg  25 mg Oral QHS PRN Mansy, Jan A, MD       vitamin B-12 (CYANOCOBALAMIN) tablet 1,000 mcg  1,000 mcg Oral Daily Mansy, Jan A, MD   1,000 mcg at 02/04/22 2956   Current Outpatient Medications  Medication Sig Dispense Refill   acetaminophen (TYLENOL) 500 MG tablet Take 1,000 mg by mouth daily as needed (for pain).     amiodarone (PACERONE) 200 MG tablet Take 0.5 tablets (100 mg total) by mouth every Monday, Wednesday, and Friday. 45 tablet 3   Carboxymethylcellulose Sodium (EYE DROPS OP) Place 2 drops into both eyes as needed (for dry eyes).      Cholecalciferol (VITAMIN D3) 25 MCG (1000 UT) CAPS Take 1,000 Units by mouth See admin instructions. Take 1 capsule by mouth four times a week     Cyanocobalamin (VITAMIN B12) 1000 MCG TBCR Take 1,000 mcg by mouth daily.     diltiazem (CARDIZEM CD) 300 MG 24 hr capsule Take 300 mg by mouth daily.     ELIQUIS 5 MG TABS tablet TAKE 1 TABLET BY MOUTH TWICE  DAILY (Patient taking differently: Take 5 mg by mouth 2 (two)  times daily.) 180 tablet 3   escitalopram (LEXAPRO) 20 MG tablet Take 20 mg by mouth daily.   11   Multiple Vitamins-Minerals (PRESERVISION/LUTEIN) CAPS Take 1 capsule by mouth 2 (two) times daily.      MYRBETRIQ 25 MG TB24 tablet Take 25  mg by mouth daily.     ipratropium (ATROVENT) 0.03 % nasal spray Place 1 spray into both nostrils as needed.  (Patient not taking: Reported on 02/03/2022)      Allergies as of 02/03/2022 - Review Complete 02/03/2022  Allergen Reaction Noted   Asa [aspirin] Other (See Comments) 01/08/2017   Nsaids Other (See Comments) 06/04/2017   Mirtazapine Other (See Comments) 05/09/2021    Family History  Family history unknown: Yes    Social History   Socioeconomic History   Marital status: Married    Spouse name: Not on file   Number of children: Not on file   Years of education: Not on file   Highest education level: Not on file  Occupational History   Not on file  Tobacco Use   Smoking status: Former    Types: Cigarettes   Smokeless tobacco: Never   Tobacco comments:    Pt quit 50+ years ago  Vaping Use   Vaping Use: Never used  Substance and Sexual Activity   Alcohol use: Yes    Alcohol/week: 1.0 standard drink of alcohol    Types: 1 Glasses of wine per week    Comment: occasionally   Drug use: No   Sexual activity: Not on file  Other Topics Concern   Not on file  Social History Narrative   Not on file   Social Determinants of Health   Financial Resource Strain: Not on file  Food Insecurity: Not on file  Transportation Needs: Not on file  Physical Activity: Not on file  Stress: Not on file  Social Connections: Not on file  Intimate Partner Violence: Not on file    Review of Systems: Unable to obtain due to patient's memory impairment  Physical Exam: Vital signs in last 24 hours: Temp:  [97.9 F (36.6 C)-98.3 F (36.8 C)] 97.9 F (36.6 C) (07/07 2200) Pulse Rate:  [78-91] 91 (07/08 0930) Resp:  [14-18] 16 (07/08 0930) BP:  (106-191)/(55-80) 125/55 (07/08 0930) SpO2:  [89 %-96 %] 93 % (07/08 0930)   General:   Alert,  Well-developed, well-nourished, pleasant and cooperative in NAD Head:  Normocephalic and atraumatic. Eyes:  Sclera icteric bilaterally,   Conjunctiva pink. Ears:  Normal auditory acuity. Nose:  No deformity, discharge,  or lesions. Mouth:  No deformity or lesions.  Oropharynx pink & moist. Neck:  Supple; no masses or thyromegaly. Lungs:  Clear throughout to auscultation.   No wheezes, crackles, or rhonchi. No acute distress. Heart:  Regular rate and rhythm; no murmurs, clicks, rubs,  or gallops. Abdomen:  Reducible 3 cm supraumbilical hernia, right upper quadrant and epigastric tenderness with voluntary guarding;  Msk:  Symmetrical without gross deformities. Normal posture. Pulses:  Normal pulses noted. Extremities:  Without clubbing or edema. Neurologic:  Intermittent confusion; no focal neuromotor deficits Skin:  Scattered ecchymoses, otherwise intact without significant lesions or rashes. Psych:  Alert and cooperative. Normal mood and affect.   Lab Results: Recent Labs    02/03/22 1417 02/04/22 0532  WBC 16.9* 17.8*  HGB 14.1 13.0  HCT 43.0 39.3  PLT 155 104*   BMET Recent Labs    02/03/22 1417 02/04/22 0532  NA 134* 136  K 3.8 3.6  CL 101 103  CO2 24 22  GLUCOSE 140* 92  BUN 25* 27*  CREATININE 0.84 1.13*  CALCIUM 8.4* 8.0*   LFT Recent Labs    02/04/22 0532  PROT 6.2*  ALBUMIN 3.2*  AST 256*  ALT 329*  ALKPHOS  133*  BILITOT 4.9*   PT/INR No results for input(s): "LABPROT", "INR" in the last 72 hours.  Studies/Results: US Abdomen Limited RUQ (LIVER/GB)  Result Date: 02/03/2022 CLINICAL DATA:  Elevated LFT EXAM: ULTRASOUND ABDOMEN LIMITED RIGHT UPPER QUADRANT COMPARISON:  CT 02/03/2022 FINDINGS: Gallbladder: Markedly dilated. Moderate sludge within the gallbladder. No shadowing stones. Echogenic area within the lumen of the gallbladder containing vascularity  suggesting solid tissue. Negative sonographic Murphy. Common bile duct: Diameter: 13 mm Liver: Positive for intra hepatic biliary dilatation. No focal hepatic abnormality. Portal vein is patent on color Doppler imaging with normal direction of blood flow towards the liver. Other: None. IMPRESSION: 1. Severe gallbladder distension to the level of right lower quadrant. Hyper and hypoechoic material is present within the gallbladder lumen, this appears to contain vascularity and raises concern for gallbladder mass lesion. Surgical consultation is recommended. Negative sonographic Murphy 2. Severe intra and extrahepatic biliary dilatation. Electronically Signed   By: Donavan Foil M.D.   On: 02/03/2022 18:20   CT Abdomen Pelvis W Contrast  Result Date: 02/03/2022 CLINICAL DATA:  Abdominal pain, nausea, vomiting, elevated liver enzymes EXAM: CT ABDOMEN AND PELVIS WITH CONTRAST TECHNIQUE: Multidetector CT imaging of the abdomen and pelvis was performed using the standard protocol following bolus administration of intravenous contrast. RADIATION DOSE REDUCTION: This exam was performed according to the departmental dose-optimization program which includes automated exposure control, adjustment of the mA and/or kV according to patient size and/or use of iterative reconstruction technique. CONTRAST:  159m OMNIPAQUE IOHEXOL 300 MG/ML  SOLN COMPARISON:  01/06/2019 FINDINGS: Lower chest: Cardiomegaly. Coronary artery calcifications. Trace bilateral pleural effusions. Irregular scarring and or atelectasis in the bilateral lung bases. Hepatobiliary: No solid liver abnormality is seen. Severe intra and extrahepatic biliary ductal dilatation, central common bile duct measuring up to 1.7 cm in caliber. There is a faintly calcified calculus near the ampulla measuring 0.9 cm (series 4, image 24). Faintly calcified sludge and or additional gallstones in the gallbladder. Pancreas: Unremarkable. No pancreatic ductal dilatation or  surrounding inflammatory changes. Spleen: Normal in size without significant abnormality. Adrenals/Urinary Tract: Adrenal glands are unremarkable. Simple, benign bilateral renal cortical cysts, for which no further follow-up or characterization is required. Kidneys are otherwise normal, without renal calculi, solid lesion, or hydronephrosis. Bladder is unremarkable. Stomach/Bowel: Stomach is within normal limits. Appendix not clearly visualized. No evidence of bowel wall thickening, distention, or inflammatory changes. Severe pancolonic diverticulosis. Vascular/Lymphatic: Aortic atherosclerosis. No enlarged abdominal or pelvic lymph nodes. Reproductive: No mass or other significant abnormality. Other: No abdominal wall hernia or abnormality. No ascites. Musculoskeletal: No acute or significant osseous findings. Status post bilateral hip arthroplasty. IMPRESSION: 1. Severe intra and extrahepatic biliary ductal dilatation, central common bile duct measuring up to 1.7 cm in caliber. There is a faintly calcified calculus near the ampulla measuring 0.9 cm. Faintly calcified sludge and or additional gallstones in the distended gallbladder. Findings are consistent with choledocholithiasis. 2. Severe pancolonic diverticulosis without evidence of acute diverticulitis. 3. Cardiomegaly and coronary artery disease. 4. Trace bilateral pleural effusions. Aortic Atherosclerosis (ICD10-I70.0). Electronically Signed   By: ADelanna AhmadiM.D.   On: 02/03/2022 16:06    Impression:   Abdominal pain.  Suspected cholecystitis + choledocholithiasis (and cholangitis). Elevated LFTs Gallstones and CBD stones. Chronic anticoagulation, on Eliquis, last dose yesterday ~ noon. Wall thickening gallbladder on U/S, suspect cholecystitis, but GB malignancy not ruled out per radiology report.  Plan:   Clear liquid diet OK, NPO after midnight.  IV antibiotics (Zosyn or  equivalent).  Hold anticoagulation.  IV hydration, replete  electrolytes.  Case reviewed with Dr. Carlean Purl.  ERCP tomorrow ~ noon with Dr. Carlean Purl, after two days off Eliquis, for anticipated biliary sphincterotomy and stone extraction. Surgery team following for consideration of cholecystectomy vs cholecystostomy. Risks (up to and including bleeding, infection, perforation, pancreatitis that can be complicated by infected necrosis and death), benefits (removal of stones, alleviating blockage, decreasing risk of cholangitis or choledocholithiasis-related pancreatitis), and alternatives (watchful waiting, percutaneous transhepatic cholangiography) of ERCP were explained to patient/family in detail and patient elects to proceed.  [facility] will follow.   LOS: 1 day   Dejia Ebron M  02/04/2022, 10:36 AM  Cell [phone] If no answer or after 5 PM call [phone]

## 2022-02-04 NOTE — Assessment & Plan Note (Signed)
-   We will follow LFTs with hydration. - Pain management to be provided. - Viral hepatitis markers will be obtained. - Gastroenterology consult will be obtained as mentioned above

## 2022-02-04 NOTE — Consult Note (Signed)
[facility] Consultation Note  Referring Provider: [facility] Primary Care Physician:  Lajean Manes, MD  Reason for Consultation:  abdominal pain, elevated LFTs  HPI: Stephanie Ritter is a 88 y.o. female with chronic memory impairment and history of atrial fibrillation on Eliquis. Much of history is provided by daughter.  Patient with two-day history of upper abdominal pain.  Found to have elevated LFTs.  Has gallstones and biliary ductal dilatation and stone in distal CBD (on CT scan) near ampulla. No fevers or blood in stool.  No prior episodes of this type of pain.   Past Medical History:  Diagnosis Date   CHF (congestive heart failure) (HCC)    Hypertension    Persistent atrial fibrillation (HCC)    Tinnitus     Past Surgical History:  Procedure Laterality Date   APPENDECTOMY     60 years ago   ATRIAL FIBRILLATION ABLATION N/A    8 years ago   BOWEL RESECTION N/A 08/07/2018   Procedure: SMALL BOWEL RESECTION;  Surgeon: Clovis Riley, MD;  Location: [place];  Service: General;  Laterality: N/A;   hip replacement Bilateral    4 and 8 years ago   [place] Right 08/07/2018   Procedure: Laparoscopic right femoral hernia repair with mesh, possible open, possible bowel resection;  Surgeon: Clovis Riley, MD;  Location: [place];  Service: General;  Laterality: Right;    Prior to Admission medications   Medication Sig Start Date End Date Taking? Authorizing Provider  acetaminophen (TYLENOL) 500 MG tablet Take 1,000 mg by mouth daily as needed (for pain). 02/01/18  Yes [provider]  amiodarone (PACERONE) 200 MG tablet Take 0.5 tablets (100 mg total) by mouth every Monday, Wednesday, and Friday. 08/31/21  Yes Sherran Needs, NP  Carboxymethylcellulose Sodium (EYE DROPS OP) Place 2 drops into both eyes as needed (for dry eyes).    Yes [provider]  Cholecalciferol (VITAMIN D3) 25 MCG (1000 UT) CAPS Take 1,000 Units by mouth  See admin instructions. Take 1 capsule by mouth four times a week 02/06/19  Yes [provider]  Cyanocobalamin (VITAMIN B12) 1000 MCG TBCR Take 1,000 mcg by mouth daily. 05/22/19  Yes [provider]  diltiazem (CARDIZEM CD) 300 MG 24 hr capsule Take 300 mg by mouth daily. 07/28/21  Yes [provider]  ELIQUIS 5 MG TABS tablet TAKE 1 TABLET BY MOUTH TWICE  DAILY Patient taking differently: Take 5 mg by mouth 2 (two) times daily. 12/05/21  Yes Sherran Needs, NP  escitalopram (LEXAPRO) 20 MG tablet Take 20 mg by mouth daily.  12/27/16  Yes [provider]  Multiple Vitamins-Minerals (PRESERVISION/LUTEIN) CAPS Take 1 capsule by mouth 2 (two) times daily.    Yes [provider]  MYRBETRIQ 25 MG TB24 tablet Take 25 mg by mouth daily. 01/03/19  Yes [provider]  ipratropium (ATROVENT) 0.03 % nasal spray Place 1 spray into both nostrils as needed.  Patient not taking: Reported on 02/03/2022 03/17/19   [provider]    Current Facility-Administered Medications  Medication Dose Route Frequency Provider Last Rate Last Admin   0.9 %  sodium chloride infusion   Intravenous Continuous Mansy, Jan A, MD 100 mL/hr at 02/04/22 1015 Infusion Verify at 02/04/22 1015   acetaminophen (TYLENOL) tablet 650 mg  650 mg Oral Q6H PRN Mansy, Jan A, MD       Or   acetaminophen (TYLENOL) suppository 650 mg  650 mg  Rectal Q6H PRN Mansy, Arvella Merles, MD       [START ON 02/06/2022] amiodarone (PACERONE) tablet 100 mg  100 mg Oral Q M,W,F Mansy, Jan A, MD       cholecalciferol (VITAMIN D3) tablet 2,000 Units  2,000 Units Oral q morning Mansy, Jan A, MD   2,000 Units at 02/04/22 0920   diltiazem (CARDIZEM CD) 24 hr capsule 300 mg  300 mg Oral Daily Mansy, Jan A, MD   300 mg at 02/04/22 0920   enoxaparin (LOVENOX) injection 40 mg  40 mg Subcutaneous Q24H Mansy, Jan A, MD   40 mg at 02/04/22 0919   escitalopram (LEXAPRO) tablet 20 mg  20 mg Oral Daily Mansy, Jan A, MD   20 mg  at 02/04/22 8338   ipratropium (ATROVENT) 0.03 % nasal spray 1 spray  1 spray Each Nare PRN Mansy, Jan A, MD       magnesium hydroxide (MILK OF MAGNESIA) suspension 30 mL  30 mL Oral Daily PRN Mansy, Jan A, MD       mirabegron ER (MYRBETRIQ) tablet 25 mg  25 mg Oral Daily Mansy, Jan A, MD   25 mg at 02/04/22 0920   morphine (PF) 2 MG/ML injection 2 mg  2 mg Intravenous Q4H PRN Mansy, Jan A, MD       multivitamin (PROSIGHT) tablet 1 tablet  1 tablet Oral Daily Mansy, Jan A, MD   1 tablet at 02/04/22 0920   ondansetron (ZOFRAN) tablet 4 mg  4 mg Oral Q6H PRN Mansy, Jan A, MD       Or   ondansetron [facility]) injection 4 mg  4 mg Intravenous Q6H PRN Mansy, Jan A, MD       piperacillin-tazobactam (ZOSYN) IVPB 3.375 g  3.375 g Intravenous Q8H Poindexter, Leann T, RPH 12.5 mL/hr at 02/04/22 0810 3.375 g at 02/04/22 0810   tetrahydrozoline 0.05 % ophthalmic solution 2 drop  2 drop Both Eyes PRN Mansy, Jan A, MD       traZODone (DESYREL) tablet 25 mg  25 mg Oral QHS PRN Mansy, Jan A, MD       vitamin B-12 (CYANOCOBALAMIN) tablet 1,000 mcg  1,000 mcg Oral Daily Mansy, Jan A, MD   1,000 mcg at 02/04/22 2505   Current Outpatient Medications  Medication Sig Dispense Refill   acetaminophen (TYLENOL) 500 MG tablet Take 1,000 mg by mouth daily as needed (for pain).     amiodarone (PACERONE) 200 MG tablet Take 0.5 tablets (100 mg total) by mouth every Monday, Wednesday, and Friday. 45 tablet 3   Carboxymethylcellulose Sodium (EYE DROPS OP) Place 2 drops into both eyes as needed (for dry eyes).      Cholecalciferol (VITAMIN D3) 25 MCG (1000 UT) CAPS Take 1,000 Units by mouth See admin instructions. Take 1 capsule by mouth four times a week     Cyanocobalamin (VITAMIN B12) 1000 MCG TBCR Take 1,000 mcg by mouth daily.     diltiazem (CARDIZEM CD) 300 MG 24 hr capsule Take 300 mg by mouth daily.     ELIQUIS 5 MG TABS tablet TAKE 1 TABLET BY MOUTH TWICE  DAILY (Patient taking differently: Take 5 mg by mouth 2 (two)  times daily.) 180 tablet 3   escitalopram (LEXAPRO) 20 MG tablet Take 20 mg by mouth daily.   11   Multiple Vitamins-Minerals (PRESERVISION/LUTEIN) CAPS Take 1 capsule by mouth 2 (two) times daily.      MYRBETRIQ 25 MG TB24 tablet Take 25  mg by mouth daily.     ipratropium (ATROVENT) 0.03 % nasal spray Place 1 spray into both nostrils as needed.  (Patient not taking: Reported on 02/03/2022)      Allergies as of 02/03/2022 - Review Complete 02/03/2022  Allergen Reaction Noted   Asa [aspirin] Other (See Comments) 01/08/2017   Nsaids Other (See Comments) 06/04/2017   Mirtazapine Other (See Comments) 05/09/2021    Family History  Family history unknown: Yes    Social History   Socioeconomic History   Marital status: Married    Spouse name: Not on file   Number of children: Not on file   Years of education: Not on file   Highest education level: Not on file  Occupational History   Not on file  Tobacco Use   Smoking status: Former    Types: Cigarettes   Smokeless tobacco: Never   Tobacco comments:    Pt quit 50+ years ago  Vaping Use   Vaping Use: Never used  Substance and Sexual Activity   Alcohol use: Yes    Alcohol/week: 1.0 standard drink of alcohol    Types: 1 Glasses of wine per week    Comment: occasionally   Drug use: No   Sexual activity: Not on file  Other Topics Concern   Not on file  Social History Narrative   Not on file   Social Determinants of Health   Financial Resource Strain: Not on file  Food Insecurity: Not on file  Transportation Needs: Not on file  Physical Activity: Not on file  Stress: Not on file  Social Connections: Not on file  Intimate Partner Violence: Not on file    Review of Systems: Unable to obtain due to patient's memory impairment  Physical Exam: Vital signs in last 24 hours: Temp:  [97.9 F (36.6 C)-98.3 F (36.8 C)] 97.9 F (36.6 C) (07/07 2200) Pulse Rate:  [78-91] 91 (07/08 0930) Resp:  [14-18] 16 (07/08 0930) BP:  (106-191)/(55-80) 125/55 (07/08 0930) SpO2:  [89 %-96 %] 93 % (07/08 0930)   General:   Alert,  Well-developed, well-nourished, pleasant and cooperative in NAD Head:  Normocephalic and atraumatic. Eyes:  Sclera icteric bilaterally,   Conjunctiva pink. Ears:  Normal auditory acuity. Nose:  No deformity, discharge,  or lesions. Mouth:  No deformity or lesions.  Oropharynx pink & moist. Neck:  Supple; no masses or thyromegaly. Lungs:  Clear throughout to auscultation.   No wheezes, crackles, or rhonchi. No acute distress. Heart:  Regular rate and rhythm; no murmurs, clicks, rubs,  or gallops. Abdomen:  Reducible 3 cm supraumbilical hernia, right upper quadrant and epigastric tenderness with voluntary guarding;  Msk:  Symmetrical without gross deformities. Normal posture. Pulses:  Normal pulses noted. Extremities:  Without clubbing or edema. Neurologic:  Intermittent confusion; no focal neuromotor deficits Skin:  Scattered ecchymoses, otherwise intact without significant lesions or rashes. Psych:  Alert and cooperative. Normal mood and affect.   Lab Results: Recent Labs    02/03/22 1417 02/04/22 0532  WBC 16.9* 17.8*  HGB 14.1 13.0  HCT 43.0 39.3  PLT 155 104*   BMET Recent Labs    02/03/22 1417 02/04/22 0532  NA 134* 136  K 3.8 3.6  CL 101 103  CO2 24 22  GLUCOSE 140* 92  BUN 25* 27*  CREATININE 0.84 1.13*  CALCIUM 8.4* 8.0*   LFT Recent Labs    02/04/22 0532  PROT 6.2*  ALBUMIN 3.2*  AST 256*  ALT 329*  ALKPHOS  133*  BILITOT 4.9*   PT/INR No results for input(s): "LABPROT", "INR" in the last 72 hours.  Studies/Results: US Abdomen Limited RUQ (LIVER/GB)  Result Date: 02/03/2022 CLINICAL DATA:  Elevated LFT EXAM: ULTRASOUND ABDOMEN LIMITED RIGHT UPPER QUADRANT COMPARISON:  CT 02/03/2022 FINDINGS: Gallbladder: Markedly dilated. Moderate sludge within the gallbladder. No shadowing stones. Echogenic area within the lumen of the gallbladder containing vascularity  suggesting solid tissue. Negative sonographic Murphy. Common bile duct: Diameter: 13 mm Liver: Positive for intra hepatic biliary dilatation. No focal hepatic abnormality. Portal vein is patent on color Doppler imaging with normal direction of blood flow towards the liver. Other: None. IMPRESSION: 1. Severe gallbladder distension to the level of right lower quadrant. Hyper and hypoechoic material is present within the gallbladder lumen, this appears to contain vascularity and raises concern for gallbladder mass lesion. Surgical consultation is recommended. Negative sonographic Murphy 2. Severe intra and extrahepatic biliary dilatation. Electronically Signed   By: Donavan Foil M.D.   On: 02/03/2022 18:20   CT Abdomen Pelvis W Contrast  Result Date: 02/03/2022 CLINICAL DATA:  Abdominal pain, nausea, vomiting, elevated liver enzymes EXAM: CT ABDOMEN AND PELVIS WITH CONTRAST TECHNIQUE: Multidetector CT imaging of the abdomen and pelvis was performed using the standard protocol following bolus administration of intravenous contrast. RADIATION DOSE REDUCTION: This exam was performed according to the departmental dose-optimization program which includes automated exposure control, adjustment of the mA and/or kV according to patient size and/or use of iterative reconstruction technique. CONTRAST:  160m OMNIPAQUE IOHEXOL 300 MG/ML  SOLN COMPARISON:  01/06/2019 FINDINGS: Lower chest: Cardiomegaly. Coronary artery calcifications. Trace bilateral pleural effusions. Irregular scarring and or atelectasis in the bilateral lung bases. Hepatobiliary: No solid liver abnormality is seen. Severe intra and extrahepatic biliary ductal dilatation, central common bile duct measuring up to 1.7 cm in caliber. There is a faintly calcified calculus near the ampulla measuring 0.9 cm (series 4, image 24). Faintly calcified sludge and or additional gallstones in the gallbladder. Pancreas: Unremarkable. No pancreatic ductal dilatation or  surrounding inflammatory changes. Spleen: Normal in size without significant abnormality. Adrenals/Urinary Tract: Adrenal glands are unremarkable. Simple, benign bilateral renal cortical cysts, for which no further follow-up or characterization is required. Kidneys are otherwise normal, without renal calculi, solid lesion, or hydronephrosis. Bladder is unremarkable. Stomach/Bowel: Stomach is within normal limits. Appendix not clearly visualized. No evidence of bowel wall thickening, distention, or inflammatory changes. Severe pancolonic diverticulosis. Vascular/Lymphatic: Aortic atherosclerosis. No enlarged abdominal or pelvic lymph nodes. Reproductive: No mass or other significant abnormality. Other: No abdominal wall hernia or abnormality. No ascites. Musculoskeletal: No acute or significant osseous findings. Status post bilateral hip arthroplasty. IMPRESSION: 1. Severe intra and extrahepatic biliary ductal dilatation, central common bile duct measuring up to 1.7 cm in caliber. There is a faintly calcified calculus near the ampulla measuring 0.9 cm. Faintly calcified sludge and or additional gallstones in the distended gallbladder. Findings are consistent with choledocholithiasis. 2. Severe pancolonic diverticulosis without evidence of acute diverticulitis. 3. Cardiomegaly and coronary artery disease. 4. Trace bilateral pleural effusions. Aortic Atherosclerosis (ICD10-I70.0). Electronically Signed   By: ADelanna AhmadiM.D.   On: 02/03/2022 16:06    Impression:   Abdominal pain.  Suspected cholecystitis + choledocholithiasis (and cholangitis). Elevated LFTs Gallstones and CBD stones. Chronic anticoagulation, on Eliquis, last dose yesterday ~ noon. Wall thickening gallbladder on U/S, suspect cholecystitis, but GB malignancy not ruled out per radiology report.  Plan:   Clear liquid diet OK, NPO after midnight.  IV antibiotics (Zosyn or  equivalent).  Hold anticoagulation.  IV hydration, replete  electrolytes.  Case reviewed with Dr. Carlean Purl.  ERCP tomorrow ~ noon with Dr. Carlean Purl, after two days off Eliquis, for anticipated biliary sphincterotomy and stone extraction. Surgery team following for consideration of cholecystectomy vs cholecystostomy. Risks (up to and including bleeding, infection, perforation, pancreatitis that can be complicated by infected necrosis and death), benefits (removal of stones, alleviating blockage, decreasing risk of cholangitis or choledocholithiasis-related pancreatitis), and alternatives (watchful waiting, percutaneous transhepatic cholangiography) of ERCP were explained to patient/family in detail and patient elects to proceed.  [facility] will follow.   LOS: 1 day   Kansas Spainhower M  02/04/2022, 10:36 AM  Cell [phone] If no answer or after 5 PM call [phone]

## 2022-02-04 NOTE — Assessment & Plan Note (Addendum)
-   We will continue amiodarone and Cardizem CD. - We will hold off Eliquis for potential surgical intervention.

## 2022-02-04 NOTE — Assessment & Plan Note (Signed)
-   We will continue Cardizem CD ?

## 2022-02-04 NOTE — Progress Notes (Signed)
PROGRESS NOTE  Stephanie Ritter [facility]:[id number] DOB: September 19, 1926 DOA: 02/03/2022 PCP: Lajean Manes, MD   LOS: 1 day   Brief Narrative / Interim history: 88 year old female with history of PAF on Eliquis, HTN, who comes into the hospital with abdominal pain with nausea and vomiting for the past day prior to presentation.  She was found to have elevated LFTs, choledocholithiasis/cholecystitis.  GI and general surgery were consulted  Subjective / 24h Interval events: Still has some abdominal soreness.  No nausea this morning.  Daughter is at bedside.  Assesement and Plan: Principal Problem:   Choledocholithiasis Active Problems:   Acute hepatitis   Persistent atrial fibrillation (HCC)   Essential hypertension   Cholelithiasis with cholecystitis   Anticoagulated   Gallbladder mass   Depression   Vitamin B12 deficiency  Principal problem Choledocholithiasis, elevated LFTs-symptomatic with abdominal pain.  While AST and ALT are slightly better this morning T. bili increased some.  GI and general surgery to see, plan for ERCP tomorrow after Eliquis washout.  Continue antibiotics with Zosyn  Active problems Persistent A-fib-continue amiodarone, Cardizem.  Eliquis on hold.  Last dose 7/7 at noon  Questionable gallbladder mass-General surgery following  Essential hypertension-continue Cardizem  B12 deficiency-continue B12  Scheduled Meds:  [START ON 02/06/2022] amiodarone  100 mg Oral Q M,W,F   cholecalciferol  2,000 Units Oral q morning   diltiazem  300 mg Oral Daily   enoxaparin (LOVENOX) injection  40 mg Subcutaneous Q24H   escitalopram  20 mg Oral Daily   mirabegron ER  25 mg Oral Daily   multivitamin  1 tablet Oral Daily   vitamin B-12  1,000 mcg Oral Daily   Continuous Infusions:  sodium chloride 100 mL/hr at 02/04/22 1015   piperacillin-tazobactam (ZOSYN)  IV 3.375 g (02/04/22 0810)   PRN Meds:.acetaminophen **OR** acetaminophen, ipratropium, magnesium hydroxide,  morphine injection, ondansetron **OR** ondansetron (ZOFRAN) IV, tetrahydrozoline, traZODone  Diet Orders (From admission, onward)     Start     Ordered   02/05/22 0001  Diet NPO time specified  Diet effective midnight        02/04/22 1047   02/04/22 1045  Diet clear liquid Room service appropriate? Yes; Fluid consistency: Thin  Diet effective now       Question Answer Comment  Room service appropriate? Yes   Fluid consistency: Thin      02/04/22 1047            DVT prophylaxis: enoxaparin (LOVENOX) injection 40 mg Start: 02/04/22 1000   Lab Results  Component Value Date   PLT 104 (L) 02/04/2022      Code Status: DNR  Family Communication: Daughter at bedside  Status is: Inpatient  Remains inpatient appropriate because: Severity of illness   Level of care: Telemetry  Consultants:  GI Surgery   Objective: Vitals:   02/03/22 2300 02/04/22 0222 02/04/22 0555 02/04/22 0930  BP: 117/63 (!) 165/69 (!) 130/58 (!) 125/55  Pulse: 78 87 87 91  Resp: '16 16 18 16  '$ Temp:      TempSrc:      SpO2: 94% 96% 93% 93%   No intake or output data in the 24 hours ending 02/04/22 1122 Wt Readings from Last 3 Encounters:  08/31/21 69.9 kg  11/23/20 76.3 kg  05/25/20 76.7 kg    Examination:  Constitutional: NAD Eyes: no scleral icterus ENMT: Mucous membranes are moist.  Neck: normal, supple Respiratory: clear to auscultation bilaterally, no wheezing, no crackles. Cardiovascular: Regular rate and rhythm,  no murmurs / rubs / gallops.  Abdomen: Tender mainly in the epigastric and right upper quadrant areas.  No guarding or rebound. Musculoskeletal: no clubbing / cyanosis.  Skin: no rashes Neurologic: non focal    Data Reviewed: I have independently reviewed following labs and imaging studies   CBC Recent Labs  Lab 02/03/22 1417 02/04/22 0532  WBC 16.9* 17.8*  HGB 14.1 13.0  HCT 43.0 39.3  PLT 155 104*  MCV 95.3 94.2  MCH 31.3 31.2  MCHC 32.8 33.1  RDW 14.6  15.1    Recent Labs  Lab 02/03/22 1417 02/04/22 0532  NA 134* 136  K 3.8 3.6  CL 101 103  CO2 24 22  GLUCOSE 140* 92  BUN 25* 27*  CREATININE 0.84 1.13*  CALCIUM 8.4* 8.0*  AST 546* 256*  ALT 502* 329*  ALKPHOS 150* 133*  BILITOT 4.2* 4.9*  ALBUMIN 3.5 3.2*    ------------------------------------------------------------------------------------------------------------------ No results for input(s): "CHOL", "HDL", "LDLCALC", "TRIG", "CHOLHDL", "LDLDIRECT" in the last 72 hours.  No results found for: "HGBA1C" ------------------------------------------------------------------------------------------------------------------ No results for input(s): "TSH", "T4TOTAL", "T3FREE", "THYROIDAB" in the last 72 hours.  Invalid input(s): "FREET3"  Cardiac Enzymes No results for input(s): "CKMB", "TROPONINI", "MYOGLOBIN" in the last 168 hours.  Invalid input(s): "CK" ------------------------------------------------------------------------------------------------------------------ No results found for: "BNP"  CBG: No results for input(s): "GLUCAP" in the last 168 hours.  No results found for this or any previous visit (from the past 240 hour(s)).   Radiology Studies: US Abdomen Limited RUQ (LIVER/GB)  Result Date: 02/03/2022 CLINICAL DATA:  Elevated LFT EXAM: ULTRASOUND ABDOMEN LIMITED RIGHT UPPER QUADRANT COMPARISON:  CT 02/03/2022 FINDINGS: Gallbladder: Markedly dilated. Moderate sludge within the gallbladder. No shadowing stones. Echogenic area within the lumen of the gallbladder containing vascularity suggesting solid tissue. Negative sonographic Murphy. Common bile duct: Diameter: 13 mm Liver: Positive for intra hepatic biliary dilatation. No focal hepatic abnormality. Portal vein is patent on color Doppler imaging with normal direction of blood flow towards the liver. Other: None. IMPRESSION: 1. Severe gallbladder distension to the level of right lower quadrant. Hyper and hypoechoic  material is present within the gallbladder lumen, this appears to contain vascularity and raises concern for gallbladder mass lesion. Surgical consultation is recommended. Negative sonographic Murphy 2. Severe intra and extrahepatic biliary dilatation. Electronically Signed   By: Donavan Foil M.D.   On: 02/03/2022 18:20   CT Abdomen Pelvis W Contrast  Result Date: 02/03/2022 CLINICAL DATA:  Abdominal pain, nausea, vomiting, elevated liver enzymes EXAM: CT ABDOMEN AND PELVIS WITH CONTRAST TECHNIQUE: Multidetector CT imaging of the abdomen and pelvis was performed using the standard protocol following bolus administration of intravenous contrast. RADIATION DOSE REDUCTION: This exam was performed according to the departmental dose-optimization program which includes automated exposure control, adjustment of the mA and/or kV according to patient size and/or use of iterative reconstruction technique. CONTRAST:  161m OMNIPAQUE IOHEXOL 300 MG/ML  SOLN COMPARISON:  01/06/2019 FINDINGS: Lower chest: Cardiomegaly. Coronary artery calcifications. Trace bilateral pleural effusions. Irregular scarring and or atelectasis in the bilateral lung bases. Hepatobiliary: No solid liver abnormality is seen. Severe intra and extrahepatic biliary ductal dilatation, central common bile duct measuring up to 1.7 cm in caliber. There is a faintly calcified calculus near the ampulla measuring 0.9 cm (series 4, image 24). Faintly calcified sludge and or additional gallstones in the gallbladder. Pancreas: Unremarkable. No pancreatic ductal dilatation or surrounding inflammatory changes. Spleen: Normal in size without significant abnormality. Adrenals/Urinary Tract: Adrenal glands are unremarkable. Simple, benign bilateral  renal cortical cysts, for which no further follow-up or characterization is required. Kidneys are otherwise normal, without renal calculi, solid lesion, or hydronephrosis. Bladder is unremarkable. Stomach/Bowel: Stomach is  within normal limits. Appendix not clearly visualized. No evidence of bowel wall thickening, distention, or inflammatory changes. Severe pancolonic diverticulosis. Vascular/Lymphatic: Aortic atherosclerosis. No enlarged abdominal or pelvic lymph nodes. Reproductive: No mass or other significant abnormality. Other: No abdominal wall hernia or abnormality. No ascites. Musculoskeletal: No acute or significant osseous findings. Status post bilateral hip arthroplasty. IMPRESSION: 1. Severe intra and extrahepatic biliary ductal dilatation, central common bile duct measuring up to 1.7 cm in caliber. There is a faintly calcified calculus near the ampulla measuring 0.9 cm. Faintly calcified sludge and or additional gallstones in the distended gallbladder. Findings are consistent with choledocholithiasis. 2. Severe pancolonic diverticulosis without evidence of acute diverticulitis. 3. Cardiomegaly and coronary artery disease. 4. Trace bilateral pleural effusions. Aortic Atherosclerosis (ICD10-I70.0). Electronically Signed   By: Delanna Ahmadi M.D.   On: 02/03/2022 16:06     Marzetta Board, MD, PhD [facility]  Between 7 am - 7 pm I am available, please contact me via Amion (for emergencies) or Securechat (non urgent messages)  Between 7 pm - 7 am I am not available, please contact night coverage MD/APP via Amion

## 2022-02-04 NOTE — H&P (Addendum)
[place]   PATIENT NAME: Stephanie Ritter    MR#:  [id number]  DATE OF BIRTH:  Jun 11, 1927  DATE OF ADMISSION:  02/03/2022  PRIMARY CARE PHYSICIAN: Lajean Manes, MD   Patient is coming from: Home  REQUESTING/REFERRING PHYSICIAN: Aletta Edouard, MD  CHIEF COMPLAINT:   Chief Complaint  Patient presents with   Abdominal Pain    HISTORY OF PRESENT ILLNESS:  Stephanie Ritter is a 88 y.o. Caucasian female with medical history significant for CHF, hypertension and atrial fibrillation, who presented to the emergency room with acute onset of generalized abdominal pain mainly in the periumbilical area with associated nausea and vomiting since last night with associated tenderness.  She denies any fever or chills.  No dysuria, urinary frequency or urgency however she has been having diminished urine output.  No chest pain or cough or wheezing.  ED Course: Upon presentation to the emergency room, vital signs were within normal and later blood pressure was 191/80.  Labs revealed mild hyponatremia and BUN was 25.  AST was 546 and ALT 502 with total bili of 4.2 and alk phos 150.  CBC showed leukocytosis of 16.9 EKG as reviewed by me : EKG showed sinus rhythm with a rate of 66 with first-degree AV block and pulmonary disease pattern and left anterior fascicular block, LVH. Imaging: Abdominal pelvic CT scan revealed the following: 1. Severe intra and extrahepatic biliary ductal dilatation, central common bile duct measuring up to 1.7 cm in caliber. There is a faintly calcified calculus near the ampulla measuring 0.9 cm. Faintly calcified sludge and or additional gallstones in the distended gallbladder. Findings are consistent with choledocholithiasis. 2. Severe pancolonic diverticulosis without evidence of acute diverticulitis. 3. Cardiomegaly and coronary artery disease. 4. Trace bilateral pleural effusions. 5.  Aortic atherosclerosis  Right upper quadrant ultrasound revealed  the following: 1. Severe gallbladder distension to the level of right lower quadrant. Hyper and hypoechoic material is present within the gallbladder lumen, this appears to contain vascularity and raises concern for gallbladder mass lesion. Surgical consultation is recommended. Negative sonographic Murphy 2. Severe intra and extrahepatic biliary dilatation.   The patient was given 1 L bolus of IV normal saline and IV Zosyn.  She will be admitted to a telemetry bed for further evaluation and management.  PAST MEDICAL HISTORY:   Past Medical History:  Diagnosis Date   CHF (congestive heart failure) (HCC)    Hypertension    Persistent atrial fibrillation (HCC)    Tinnitus     PAST SURGICAL HISTORY:   Past Surgical History:  Procedure Laterality Date   APPENDECTOMY     60 years ago   ATRIAL FIBRILLATION ABLATION N/A    8 years ago   BOWEL RESECTION N/A 08/07/2018   Procedure: SMALL BOWEL RESECTION;  Surgeon: Clovis Riley, MD;  Location: [facility];  Service: General;  Laterality: N/A;   hip replacement Bilateral    4 and 8 years ago   [place] Right 08/07/2018   Procedure: Laparoscopic right femoral hernia repair with mesh, possible open, possible bowel resection;  Surgeon: Clovis Riley, MD;  Location: [facility];  Service: General;  Laterality: Right;    SOCIAL HISTORY:   Social History   Tobacco Use   Smoking status: Former    Types: Cigarettes   Smokeless tobacco: Never   Tobacco comments:    Pt quit 50+ years ago  Substance Use Topics   Alcohol use: Yes    Alcohol/week:  1.0 standard drink of alcohol    Types: 1 Glasses of wine per week    Comment: occasionally    FAMILY HISTORY:  Positive coronary artery disease, hypertension and Parkinson's disease.  DRUG ALLERGIES:   Allergies  Allergen Reactions   Asa [Aspirin] Other (See Comments)    Excessive bleeding   Nsaids Other (See Comments)    Bleeding, ulcers   Mirtazapine Other (See Comments)     Very groggy    REVIEW OF SYSTEMS:   ROS As per history of present illness. All pertinent systems were reviewed above. Constitutional, HEENT, cardiovascular, respiratory, GI, GU, musculoskeletal, neuro, psychiatric, endocrine, integumentary and hematologic systems were reviewed and are otherwise negative/unremarkable except for positive findings mentioned above in the HPI.   MEDICATIONS AT HOME:   Prior to Admission medications   Medication Sig Start Date End Date Taking? Authorizing Provider  acetaminophen (TYLENOL) 500 MG tablet Take 1,000 mg by mouth daily as needed (for pain). 02/01/18  Yes [provider]  amiodarone (PACERONE) 200 MG tablet Take 0.5 tablets (100 mg total) by mouth every Monday, Wednesday, and Friday. 08/31/21  Yes Sherran Needs, NP  Carboxymethylcellulose Sodium (EYE DROPS OP) Place 2 drops into both eyes as needed (for dry eyes).    Yes [provider]  Cholecalciferol (VITAMIN D3) 25 MCG (1000 UT) CAPS Take 1,000 Units by mouth See admin instructions. Take 1 capsule by mouth four times a week 02/06/19  Yes [provider]  Cyanocobalamin (VITAMIN B12) 1000 MCG TBCR Take 1,000 mcg by mouth daily. 05/22/19  Yes [provider]  diltiazem (CARDIZEM CD) 300 MG 24 hr capsule Take 300 mg by mouth daily. 07/28/21  Yes [provider]  ELIQUIS 5 MG TABS tablet TAKE 1 TABLET BY MOUTH TWICE  DAILY Patient taking differently: Take 5 mg by mouth 2 (two) times daily. 12/05/21  Yes Sherran Needs, NP  escitalopram (LEXAPRO) 20 MG tablet Take 20 mg by mouth daily.  12/27/16  Yes [provider]  Multiple Vitamins-Minerals (PRESERVISION/LUTEIN) CAPS Take 1 capsule by mouth 2 (two) times daily.    Yes [provider]  MYRBETRIQ 25 MG TB24 tablet Take 25 mg by mouth daily. 01/03/19  Yes [provider]  ipratropium (ATROVENT) 0.03 % nasal spray Place 1 spray into both nostrils as needed.  Patient not taking: Reported on  02/03/2022 03/17/19   [provider]      VITAL SIGNS:  Blood pressure (!) 165/69, pulse 87, temperature 97.9 F (36.6 C), temperature source Oral, resp. rate 16, SpO2 96 %.  PHYSICAL EXAMINATION:  Physical Exam  GENERAL:  88 y.o.-year-old Caucasian female patient lying in the bed with no acute distress.  EYES: Pupils equal, round, reactive to light and accommodation. No scleral icterus. Extraocular muscles intact.  HEENT: Head atraumatic, normocephalic. Oropharynx and nasopharynx clear.  NECK:  Supple, no jugular venous distention. No thyroid enlargement, no tenderness.  LUNGS: Normal breath sounds bilaterally, no wheezing, rales,rhonchi or crepitation. No use of accessory muscles of respiration.  CARDIOVASCULAR: Regular rate and rhythm, S1, S2 normal. No murmurs, rubs, or gallops.  ABDOMEN: Soft, nondistended, generalized abdominal tenderness mainly in the epigastric area and right upper quadrant.  Bowel sounds present. No organomegaly or mass.  EXTREMITIES: No pedal edema, cyanosis, or clubbing.  NEUROLOGIC: Cranial nerves II through XII are intact. Muscle strength 5/5 in all extremities. Sensation intact. Gait not checked.  PSYCHIATRIC: The patient is alert and oriented x 3.  Normal affect and  good eye contact. SKIN: No obvious rash, lesion, or ulcer.   LABORATORY PANEL:   CBC Recent Labs  Lab 02/03/22 1417  WBC 16.9*  HGB 14.1  HCT 43.0  PLT 155   ------------------------------------------------------------------------------------------------------------------  Chemistries  Recent Labs  Lab 02/03/22 1417  NA 134*  K 3.8  CL 101  CO2 24  GLUCOSE 140*  BUN 25*  CREATININE 0.84  CALCIUM 8.4*  AST 546*  ALT 502*  ALKPHOS 150*  BILITOT 4.2*   ------------------------------------------------------------------------------------------------------------------  Cardiac Enzymes No results for input(s): "TROPONINI" in the last 168  hours. ------------------------------------------------------------------------------------------------------------------  RADIOLOGY:  US Abdomen Limited RUQ (LIVER/GB)  Result Date: 02/03/2022 CLINICAL DATA:  Elevated LFT EXAM: ULTRASOUND ABDOMEN LIMITED RIGHT UPPER QUADRANT COMPARISON:  CT 02/03/2022 FINDINGS: Gallbladder: Markedly dilated. Moderate sludge within the gallbladder. No shadowing stones. Echogenic area within the lumen of the gallbladder containing vascularity suggesting solid tissue. Negative sonographic Murphy. Common bile duct: Diameter: 13 mm Liver: Positive for intra hepatic biliary dilatation. No focal hepatic abnormality. Portal vein is patent on color Doppler imaging with normal direction of blood flow towards the liver. Other: None. IMPRESSION: 1. Severe gallbladder distension to the level of right lower quadrant. Hyper and hypoechoic material is present within the gallbladder lumen, this appears to contain vascularity and raises concern for gallbladder mass lesion. Surgical consultation is recommended. Negative sonographic Murphy 2. Severe intra and extrahepatic biliary dilatation. Electronically Signed   By: Donavan Foil M.D.   On: 02/03/2022 18:20   CT Abdomen Pelvis W Contrast  Result Date: 02/03/2022 CLINICAL DATA:  Abdominal pain, nausea, vomiting, elevated liver enzymes EXAM: CT ABDOMEN AND PELVIS WITH CONTRAST TECHNIQUE: Multidetector CT imaging of the abdomen and pelvis was performed using the standard protocol following bolus administration of intravenous contrast. RADIATION DOSE REDUCTION: This exam was performed according to the departmental dose-optimization program which includes automated exposure control, adjustment of the mA and/or kV according to patient size and/or use of iterative reconstruction technique. CONTRAST:  168mL OMNIPAQUE IOHEXOL 300 MG/ML  SOLN COMPARISON:  01/06/2019 FINDINGS: Lower chest: Cardiomegaly. Coronary artery calcifications. Trace bilateral  pleural effusions. Irregular scarring and or atelectasis in the bilateral lung bases. Hepatobiliary: No solid liver abnormality is seen. Severe intra and extrahepatic biliary ductal dilatation, central common bile duct measuring up to 1.7 cm in caliber. There is a faintly calcified calculus near the ampulla measuring 0.9 cm (series 4, image 24). Faintly calcified sludge and or additional gallstones in the gallbladder. Pancreas: Unremarkable. No pancreatic ductal dilatation or surrounding inflammatory changes. Spleen: Normal in size without significant abnormality. Adrenals/Urinary Tract: Adrenal glands are unremarkable. Simple, benign bilateral renal cortical cysts, for which no further follow-up or characterization is required. Kidneys are otherwise normal, without renal calculi, solid lesion, or hydronephrosis. Bladder is unremarkable. Stomach/Bowel: Stomach is within normal limits. Appendix not clearly visualized. No evidence of bowel wall thickening, distention, or inflammatory changes. Severe pancolonic diverticulosis. Vascular/Lymphatic: Aortic atherosclerosis. No enlarged abdominal or pelvic lymph nodes. Reproductive: No mass or other significant abnormality. Other: No abdominal wall hernia or abnormality. No ascites. Musculoskeletal: No acute or significant osseous findings. Status post bilateral hip arthroplasty. IMPRESSION: 1. Severe intra and extrahepatic biliary ductal dilatation, central common bile duct measuring up to 1.7 cm in caliber. There is a faintly calcified calculus near the ampulla measuring 0.9 cm. Faintly calcified sludge and or additional gallstones in the distended gallbladder. Findings are consistent with choledocholithiasis. 2. Severe pancolonic diverticulosis without evidence of acute diverticulitis. 3. Cardiomegaly and coronary artery disease. 4.  Trace bilateral pleural effusions. Aortic Atherosclerosis (ICD10-I70.0). Electronically Signed   By: Delanna Ahmadi M.D.   On: 02/03/2022  16:06      IMPRESSION AND PLAN:  Assessment and Plan: * Choledocholithiasis - This is likely the main culprit for her abdominal pain that is mainly biliary colic. - The patient will be admitted to a medical telemetry bed. - We will keep her n.p.o. - She will be hydrated with IV normal saline. - We will continue IV Zosyn for now. - GI consultation will be obtained in a.m. - Dr. Paulita Fujita is aware about the patient. - We will follow LFTs. - She may need ERCP. - We will defer further work-up to our gastroenterologist.  Acute hepatitis - We will follow LFTs with hydration. - Pain management to be provided. - Viral hepatitis markers will be obtained. - Gastroenterology consult will be obtained as mentioned above  Persistent atrial fibrillation ([place]) - We will continue amiodarone and Cardizem CD. - We will hold off Eliquis for potential surgical intervention.  Essential hypertension - We will continue Cardizem CD  Vitamin B12 deficiency - We will continue vitamin B12.  Depression - We will continue Lexapro.  Gallbladder mass - General surgery was notified by the ER physician and consult will be obtained in a.m.   DVT prophylaxis: Lovenox.  Advanced Care Planning:  Code Status: The patient is DNR/DNI.  This was discussed with the patient and her daughter.   Family Communication:  The plan of care was discussed in details with the patient (and family). I answered all questions. The patient agreed to proceed with the above mentioned plan. Further management will depend upon hospital course. Disposition Plan: Back to previous home environment Consults called: Gastroenterology. All the records are reviewed and case discussed with ED provider.  Status is: Inpatient  At the time of the admission, it appears that the appropriate admission status for this patient is inpatient.  This is judged to be reasonable and necessary in order to provide the required intensity of service to  ensure the patient's safety given the presenting symptoms, physical exam findings and initial radiographic and laboratory data in the context of comorbid conditions.  The patient requires inpatient status due to high intensity of service, high risk of further deterioration and high frequency of surveillance required.  I certify that at the time of admission, it is my clinical judgment that the patient will require inpatient hospital care extending more than 2 midnights.                            Dispo: The patient is from: Home              Anticipated d/c is to: Home              Patient currently is not medically stable to d/c.              Difficult to place patient: No  Christel Mormon M.D on 02/04/2022 at 4:05 AM  [facility]   From 7 PM-7 AM, contact night-coverage [email or web address]  CC: Primary care physician; Lajean Manes, MD

## 2022-02-04 NOTE — Assessment & Plan Note (Signed)
-   We will continue Lexapro. 

## 2022-02-04 NOTE — Assessment & Plan Note (Signed)
-   We will continue vitamin B12. 

## 2022-02-04 NOTE — Progress Notes (Signed)
Assessment & Plan: HD#2 - Cholecystitis, cholelithiasis, choledocholithiasis with distal biliary obstruction Chronic anticoagulation on Eliquis Atrial fibrillation Dementia  Patient admitted to medical service - Dr. Sidney Ace and Dr. Cruzita Lederer Still in ER - awaiting bed - daughter at bedside IV Zosyn NPO AM labs - Tbili now 4.9, WBC 17K Consult to gastroenterology - Dr. Paulita Fujita to evaluate Hold Eliquis in anticipation of procedure   Will follow with you.        Armandina Gemma, MD [facility] [place] practice Office: [phone]        Chief Complaint: Abdominal pain  Subjective: Patient on stretcher, daughter at bedside.  Persistent abdominal pain, improved.  Objective: Vital signs in last 24 hours: Temp:  [97.9 F (36.6 C)-98.3 F (36.8 C)] 97.9 F (36.6 C) (07/07 2200) Pulse Rate:  [78-87] 87 (07/08 0555) Resp:  [14-18] 18 (07/08 0555) BP: (106-191)/(58-80) 130/58 (07/08 0555) SpO2:  [89 %-96 %] 93 % (07/08 0555)    Intake/Output from previous day: No intake/output data recorded. Intake/Output this shift: No intake/output data recorded.  Physical Exam: HEENT - sclerae clear, mucous membranes moist Neck - soft Abdomen - soft, mild distension; tender RUQ and right lateral abdomen, palpable mass  Lab Results:  Recent Labs    02/03/22 1417 02/04/22 0532  WBC 16.9* 17.8*  HGB 14.1 13.0  HCT 43.0 39.3  PLT 155 104*   BMET Recent Labs    02/03/22 1417 02/04/22 0532  NA 134* 136  K 3.8 3.6  CL 101 103  CO2 24 22  GLUCOSE 140* 92  BUN 25* 27*  CREATININE 0.84 1.13*  CALCIUM 8.4* 8.0*   PT/INR No results for input(s): "LABPROT", "INR" in the last 72 hours. Comprehensive Metabolic Panel:    Component Value Date/Time   NA 136 02/04/2022 0532   NA 134 (L) 02/03/2022 1417   NA 139 06/05/2017 1446   K 3.6 02/04/2022 0532   K 3.8 02/03/2022 1417   CL 103 02/04/2022 0532   CL 101 02/03/2022 1417   CO2 22 02/04/2022 0532   CO2 24  02/03/2022 1417   BUN 27 (H) 02/04/2022 0532   BUN 25 (H) 02/03/2022 1417   BUN 28 (H) 06/05/2017 1446   CREATININE 1.13 (H) 02/04/2022 0532   CREATININE 0.84 02/03/2022 1417   GLUCOSE 92 02/04/2022 0532   GLUCOSE 140 (H) 02/03/2022 1417   CALCIUM 8.0 (L) 02/04/2022 0532   CALCIUM 8.4 (L) 02/03/2022 1417   AST 256 (H) 02/04/2022 0532   AST 546 (H) 02/03/2022 1417   ALT 329 (H) 02/04/2022 0532   ALT 502 (H) 02/03/2022 1417   ALKPHOS 133 (H) 02/04/2022 0532   ALKPHOS 150 (H) 02/03/2022 1417   BILITOT 4.9 (H) 02/04/2022 0532   BILITOT 4.2 (H) 02/03/2022 1417   BILITOT 0.4 06/05/2017 1446   PROT 6.2 (L) 02/04/2022 0532   PROT 6.5 02/03/2022 1417   PROT 6.4 06/05/2017 1446   ALBUMIN 3.2 (L) 02/04/2022 0532   ALBUMIN 3.5 02/03/2022 1417   ALBUMIN 3.8 06/05/2017 1446    Studies/Results: US Abdomen Limited RUQ (LIVER/GB)  Result Date: 02/03/2022 CLINICAL DATA:  Elevated LFT EXAM: ULTRASOUND ABDOMEN LIMITED RIGHT UPPER QUADRANT COMPARISON:  CT 02/03/2022 FINDINGS: Gallbladder: Markedly dilated. Moderate sludge within the gallbladder. No shadowing stones. Echogenic area within the lumen of the gallbladder containing vascularity suggesting solid tissue. Negative sonographic Murphy. Common bile duct: Diameter: 13 mm Liver: Positive for intra hepatic biliary dilatation. No focal hepatic abnormality. Portal vein is  patent on color Doppler imaging with normal direction of blood flow towards the liver. Other: None. IMPRESSION: 1. Severe gallbladder distension to the level of right lower quadrant. Hyper and hypoechoic material is present within the gallbladder lumen, this appears to contain vascularity and raises concern for gallbladder mass lesion. Surgical consultation is recommended. Negative sonographic Murphy 2. Severe intra and extrahepatic biliary dilatation. Electronically Signed   By: Donavan Foil M.D.   On: 02/03/2022 18:20   CT Abdomen Pelvis W Contrast  Result Date: 02/03/2022 CLINICAL  DATA:  Abdominal pain, nausea, vomiting, elevated liver enzymes EXAM: CT ABDOMEN AND PELVIS WITH CONTRAST TECHNIQUE: Multidetector CT imaging of the abdomen and pelvis was performed using the standard protocol following bolus administration of intravenous contrast. RADIATION DOSE REDUCTION: This exam was performed according to the departmental dose-optimization program which includes automated exposure control, adjustment of the mA and/or kV according to patient size and/or use of iterative reconstruction technique. CONTRAST:  131m OMNIPAQUE IOHEXOL 300 MG/ML  SOLN COMPARISON:  01/06/2019 FINDINGS: Lower chest: Cardiomegaly. Coronary artery calcifications. Trace bilateral pleural effusions. Irregular scarring and or atelectasis in the bilateral lung bases. Hepatobiliary: No solid liver abnormality is seen. Severe intra and extrahepatic biliary ductal dilatation, central common bile duct measuring up to 1.7 cm in caliber. There is a faintly calcified calculus near the ampulla measuring 0.9 cm (series 4, image 24). Faintly calcified sludge and or additional gallstones in the gallbladder. Pancreas: Unremarkable. No pancreatic ductal dilatation or surrounding inflammatory changes. Spleen: Normal in size without significant abnormality. Adrenals/Urinary Tract: Adrenal glands are unremarkable. Simple, benign bilateral renal cortical cysts, for which no further follow-up or characterization is required. Kidneys are otherwise normal, without renal calculi, solid lesion, or hydronephrosis. Bladder is unremarkable. Stomach/Bowel: Stomach is within normal limits. Appendix not clearly visualized. No evidence of bowel wall thickening, distention, or inflammatory changes. Severe pancolonic diverticulosis. Vascular/Lymphatic: Aortic atherosclerosis. No enlarged abdominal or pelvic lymph nodes. Reproductive: No mass or other significant abnormality. Other: No abdominal wall hernia or abnormality. No ascites. Musculoskeletal: No  acute or significant osseous findings. Status post bilateral hip arthroplasty. IMPRESSION: 1. Severe intra and extrahepatic biliary ductal dilatation, central common bile duct measuring up to 1.7 cm in caliber. There is a faintly calcified calculus near the ampulla measuring 0.9 cm. Faintly calcified sludge and or additional gallstones in the distended gallbladder. Findings are consistent with choledocholithiasis. 2. Severe pancolonic diverticulosis without evidence of acute diverticulitis. 3. Cardiomegaly and coronary artery disease. 4. Trace bilateral pleural effusions. Aortic Atherosclerosis (ICD10-I70.0). Electronically Signed   By: ADelanna AhmadiM.D.   On: 02/03/2022 16:06      TArmandina Gemma7/02/2022   Patient ID: LRoderic Ritter female   DOB: 411-Aug-1928 88y.o.   MRN: [id number]

## 2022-02-05 ENCOUNTER — Encounter (HOSPITAL_COMMUNITY): Payer: Self-pay | Admitting: Family Medicine

## 2022-02-05 ENCOUNTER — Inpatient Hospital Stay (HOSPITAL_COMMUNITY): Payer: Medicare Other | Admitting: Certified Registered Nurse Anesthetist

## 2022-02-05 ENCOUNTER — Encounter (HOSPITAL_COMMUNITY)
Admission: EM | Disposition: A | Discharge status: Home Health | Payer: Self-pay | Source: Home / Self Care | Attending: Internal Medicine

## 2022-02-05 ENCOUNTER — Inpatient Hospital Stay (HOSPITAL_COMMUNITY): Payer: Medicare Other

## 2022-02-05 DIAGNOSIS — K8050 Calculus of bile duct without cholangitis or cholecystitis without obstruction: Secondary | ICD-10-CM | POA: Diagnosis not present

## 2022-02-05 DIAGNOSIS — K5710 Diverticulosis of small intestine without perforation or abscess without bleeding: Secondary | ICD-10-CM

## 2022-02-05 DIAGNOSIS — K8051 Calculus of bile duct without cholangitis or cholecystitis with obstruction: Secondary | ICD-10-CM | POA: Diagnosis not present

## 2022-02-05 DIAGNOSIS — I4891 Unspecified atrial fibrillation: Secondary | ICD-10-CM

## 2022-02-05 DIAGNOSIS — K835 Biliary cyst: Secondary | ICD-10-CM

## 2022-02-05 HISTORY — PX: ERCP: SHX5425

## 2022-02-05 HISTORY — PX: REMOVAL OF STONES: SHX5545

## 2022-02-05 HISTORY — PX: SPHINCTEROTOMY: SHX5544

## 2022-02-05 LAB — GLUCOSE, CAPILLARY
Glucose-Capillary: 60 mg/dL — ABNORMAL LOW (ref 70–99)
Glucose-Capillary: 104 mg/dL — ABNORMAL HIGH (ref 70–99)

## 2022-02-05 LAB — COMPREHENSIVE METABOLIC PANEL
ALT: 190 U/L — ABNORMAL HIGH (ref 0–44)
AST: 98 U/L — ABNORMAL HIGH (ref 15–41)
Albumin: 2.8 g/dL — ABNORMAL LOW (ref 3.5–5.0)
Alkaline Phosphatase: 134 U/L — ABNORMAL HIGH (ref 38–126)
Anion gap: 10 (ref 5–15)
BUN: 19 mg/dL (ref 8–23)
CO2: 23 mmol/L (ref 22–32)
Calcium: 7.8 mg/dL — ABNORMAL LOW (ref 8.9–10.3)
Chloride: 104 mmol/L (ref 98–111)
Creatinine, Ser: 0.94 mg/dL (ref 0.44–1.00)
GFR, Estimated: 57 mL/min — ABNORMAL LOW (ref >60)
Glucose, Bld: 61 mg/dL — ABNORMAL LOW (ref 70–99)
Potassium: 3.1 mmol/L — ABNORMAL LOW (ref 3.5–5.1)
Sodium: 137 mmol/L (ref 135–145)
Total Bilirubin: 3 mg/dL — ABNORMAL HIGH (ref 0.3–1.2)
Total Protein: 5.6 g/dL — ABNORMAL LOW (ref 6.5–8.1)

## 2022-02-05 LAB — CBC
HCT: 39 % (ref 36.0–46.0)
Hemoglobin: 12.8 g/dL (ref 12.0–15.0)
MCH: 31.1 pg (ref 26.0–34.0)
MCHC: 32.8 g/dL (ref 30.0–36.0)
MCV: 94.7 fL (ref 80.0–100.0)
Platelets: 98 10*3/uL — ABNORMAL LOW (ref 150–400)
RBC: 4.12 MIL/uL (ref 3.87–5.11)
RDW: 15 % (ref 11.5–15.5)
WBC: 14.4 10*3/uL — ABNORMAL HIGH (ref 4.0–10.5)
nRBC: 0 % (ref 0.0–0.2)

## 2022-02-05 LAB — MAGNESIUM: Magnesium: 2.2 mg/dL (ref 1.7–2.4)

## 2022-02-05 SURGERY — ERCP, WITH INTERVENTION IF INDICATED
Anesthesia: General

## 2022-02-05 MED ORDER — ONDANSETRON HCL 4 MG/2ML IJ SOLN
4.0000 mg | Freq: Once | INTRAMUSCULAR | Status: DC | PRN
Start: 2022-02-05 — End: 2022-02-05

## 2022-02-05 MED ORDER — LIDOCAINE 2% (20 MG/ML) 5 ML SYRINGE
INTRAMUSCULAR | Status: DC | PRN
  Administered 2022-02-05: 60 mg via INTRAVENOUS

## 2022-02-05 MED ORDER — SUGAMMADEX SODIUM 200 MG/2ML IV SOLN
INTRAVENOUS | Status: DC | PRN
  Administered 2022-02-05: 200 mg via INTRAVENOUS

## 2022-02-05 MED ORDER — GLUCAGON HCL RDNA (DIAGNOSTIC) 1 MG IJ SOLR
INTRAMUSCULAR | Status: AC
  Filled 2022-02-05: qty 1

## 2022-02-05 MED ORDER — DEXTROSE 50 % IV SOLN
INTRAVENOUS | Status: AC
  Filled 2022-02-05: qty 50

## 2022-02-05 MED ORDER — LACTATED RINGERS IV SOLN: INTRAVENOUS | Status: DC

## 2022-02-05 MED ORDER — PROPOFOL 10 MG/ML IV BOLUS
INTRAVENOUS | Status: DC | PRN
  Administered 2022-02-05: 80 mg via INTRAVENOUS

## 2022-02-05 MED ORDER — LACTATED RINGERS IV SOLN: INTRAVENOUS | Status: DC | PRN

## 2022-02-05 MED ORDER — DEXTROSE 50 % IV SOLN
INTRAVENOUS | Status: DC | PRN
  Administered 2022-02-05: 12.5 g via INTRAVENOUS

## 2022-02-05 MED ORDER — FENTANYL CITRATE (PF) 100 MCG/2ML IJ SOLN
INTRAMUSCULAR | Status: AC
  Filled 2022-02-05: qty 2

## 2022-02-05 MED ORDER — ROCURONIUM BROMIDE 10 MG/ML (PF) SYRINGE
PREFILLED_SYRINGE | INTRAVENOUS | Status: DC | PRN
  Administered 2022-02-05: 60 mg via INTRAVENOUS

## 2022-02-05 MED ORDER — GLUCAGON HCL RDNA (DIAGNOSTIC) 1 MG IJ SOLR
INTRAMUSCULAR | Status: DC | PRN
  Administered 2022-02-05: .5 mg via INTRAVENOUS

## 2022-02-05 MED ORDER — FENTANYL CITRATE (PF) 100 MCG/2ML IJ SOLN
INTRAMUSCULAR | Status: DC | PRN
  Administered 2022-02-05: 100 ug via INTRAVENOUS

## 2022-02-05 MED ORDER — PHENYLEPHRINE 80 MCG/ML (10ML) SYRINGE FOR IV PUSH (FOR BLOOD PRESSURE SUPPORT)
PREFILLED_SYRINGE | INTRAVENOUS | Status: DC | PRN
  Administered 2022-02-05 (×6): 120 ug via INTRAVENOUS

## 2022-02-05 MED ORDER — POTASSIUM CHLORIDE 10 MEQ/100ML IV SOLN
10.0000 meq | INTRAVENOUS | Status: AC
  Administered 2022-02-05 (×2): 10 meq via INTRAVENOUS
  Filled 2022-02-05 (×2): qty 100

## 2022-02-05 MED ORDER — POTASSIUM CHLORIDE CRYS ER 20 MEQ PO TBCR: 40.0000 meq | EXTENDED_RELEASE_TABLET | ORAL | Status: DC

## 2022-02-05 MED ORDER — PROPOFOL 10 MG/ML IV BOLUS
INTRAVENOUS | Status: AC
  Filled 2022-02-05: qty 20

## 2022-02-05 MED ORDER — POTASSIUM CHLORIDE CRYS ER 20 MEQ PO TBCR
40.0000 meq | EXTENDED_RELEASE_TABLET | ORAL | Status: AC
  Administered 2022-02-05: 40 meq via ORAL
  Filled 2022-02-05: qty 2

## 2022-02-05 MED ORDER — DICLOFENAC SUPPOSITORY 100 MG
RECTAL | Status: AC
  Filled 2022-02-05: qty 1

## 2022-02-05 MED ORDER — FENTANYL CITRATE (PF) 100 MCG/2ML IJ SOLN: 25.0000 ug | INTRAMUSCULAR | Status: DC | PRN

## 2022-02-05 MED ORDER — PROPOFOL 500 MG/50ML IV EMUL
INTRAVENOUS | Status: DC | PRN
  Administered 2022-02-05: 125 ug/kg/min via INTRAVENOUS

## 2022-02-05 MED ORDER — SODIUM CHLORIDE 0.9 % IV SOLN
INTRAVENOUS | Status: DC | PRN
  Administered 2022-02-05: 50 mL

## 2022-02-05 NOTE — Anesthesia Postprocedure Evaluation (Signed)
Anesthesia Post Note  Patient: Krissie Merrick Gotwalt  Procedure(s) Performed: ENDOSCOPIC RETROGRADE CHOLANGIOPANCREATOGRAPHY (ERCP) SPHINCTEROTOMY REMOVAL OF STONES     Patient location during evaluation: PACU Anesthesia Type: General Level of consciousness: awake and alert Pain management: pain level controlled Vital Signs Assessment: post-procedure vital signs reviewed and stable Respiratory status: spontaneous breathing, nonlabored ventilation and respiratory function stable Cardiovascular status: blood pressure returned to baseline and stable Postop Assessment: no apparent nausea or vomiting Anesthetic complications: no   No notable events documented.  Last Vitals:  Vitals:   02/05/22 1450 02/05/22 1500  BP: 107/61 (!) 105/58  Pulse: 75 (!) 110  Resp: 17 15  Temp: 36.9 C   SpO2: 97% 96%    Last Pain:  Vitals:   02/05/22 1438  TempSrc:   PainSc: Asleep                 Pritesh Sobecki A.

## 2022-02-05 NOTE — Transfer of Care (Signed)
Immediate Anesthesia Transfer of Care Note  Patient: Stephanie Ritter  Procedure(s) Performed: ENDOSCOPIC RETROGRADE CHOLANGIOPANCREATOGRAPHY (ERCP) SPHINCTEROTOMY REMOVAL OF STONES  Patient Location: PACU  Anesthesia Type:General  Level of Consciousness: drowsy  Airway & Oxygen Therapy: Patient Spontanous Breathing and Patient connected to face mask  Post-op Assessment: Report given to RN and Post -op Vital signs reviewed and stable  Post vital signs: Reviewed and stable  Last Vitals:  Vitals Value Taken Time  BP 98/55 02/05/22 1438  Temp    Pulse 106 02/05/22 1439  Resp 17 02/05/22 1439  SpO2 99 % 02/05/22 1439  Vitals shown include unvalidated device data.  Last Pain:  Vitals:   02/05/22 1300  TempSrc: Temporal  PainSc: 0-No pain         Complications: No notable events documented.

## 2022-02-05 NOTE — Op Note (Signed)
[facility] Patient Name: Stephanie Ritter Procedure Date: 02/05/2022 MRN: [id number] Attending MD: Gatha Mayer , MD Date of Birth: 06/29/1927 CSN: [id number] Age: 88 Admit Type: Inpatient Procedure:                ERCP Indications:              Bile duct stone(s) Providers:                Gatha Mayer, MD, Jeanella Cara, RN,                            Ladona Ridgel, Technician Referring MD:              Medicines:                General Anesthesia, On running Zosyn, diclofenac                            100 mg per rectum Complications:            No immediate complications. Estimated Blood Loss:     Estimated blood loss was minimal. Procedure:                Pre-Anesthesia Assessment:                           - Prior to the procedure, a History and Physical                            was performed, and patient medications and                            allergies were reviewed. The patient's tolerance of                            previous anesthesia was also reviewed. The risks                            and benefits of the procedure and the sedation                            options and risks were discussed with the patient.                            All questions were answered, and informed consent                            was obtained. Prior Anticoagulants: The patient                            last took Eliquis (apixaban) 2 days prior to the                            procedure. ASA Grade Assessment: III - A patient  with severe systemic disease. After reviewing the                            risks and benefits, the patient was deemed in                            satisfactory condition to undergo the procedure.                           After obtaining informed consent, the scope was                            passed under direct vision. Throughout the                            procedure, the patient's blood pressure,  pulse, and                            oxygen saturations were monitored continuously. The                            [id number]-Q190V ([id number] Olympus duodenoscope was                            introduced through the mouth, and used to inject                            contrast into and used to inject contrast into the                            bile duct. The ERCP was somewhat difficult due to                            poor endoscopic visualization. The patient                            tolerated the procedure well. Scope In: Scope Out: Findings:      The scout film was normal. Esophagus not seen well. Stomach normal.       Duodenum - small diverticulum in vicinity of papilla. The papilla was       erythematous but otherwise normal. Hydrotome + wire used to cannulate       the bile duct deeply and contrast injected showing a diffusely dilated       biliary tree. Stone seen on CT not obvious, sphincterotomy performed -       somewhat difficult due to bowel spasm and limited visualization -       overcome with glucagon and waiting. Then the 12-15 mm balloon was       inserted and multiple sweeps w/ 12 mm size resulted in removal of 8 mm       stone and large amounts of sludge and debris which had cleared after       multiple sweeps. Occlusion cholangiogram negative. cystic duct filled       and gallbladder filled partially. Pancreas not entered by intent. Slight  blood on ampulla after sphincterotomy and balloon sweeps but was not       actrively bleeding. Impression:               - Choledocholithiasis with an obstruction was                            found. Complete removal was accomplished by biliary                            sphincterotomy and balloon extraction.                           - The entire biliary tree was moderately dilated.                           - Duodenal diverticulum - periampullary Moderate Sedation:      Not Applicable - Patient had care per  Anesthesia. Recommendation:           - Return patient to hospital ward for ongoing care.                           - Clear liquids only today                           Continue to hold Eliquis                           Consider cholecystectomy - per [facility] and                            patient/family                           If no cholecystectomy planned restart Eliquis 7/11,                            and dc Zosyn tomorrow then                           [facility] to f/u tomorrow Procedure Code(s):        --- Professional ---                           [phone], Endoscopic retrograde                            cholangiopancreatography (ERCP); with removal of                            calculi/debris from biliary/pancreatic duct(s)                           43262, Endoscopic retrograde                            cholangiopancreatography (ERCP); with  sphincterotomy/papillotomy Diagnosis Code(s):        --- Professional ---                           K80.51, Calculus of bile duct without cholangitis                            or cholecystitis with obstruction                           K83.8, Other specified diseases of biliary tract                           K57.10, Diverticulosis of small intestine without                            perforation or abscess without bleeding CPT copyright 2019 American Medical Association. All rights reserved. The codes documented in this report are preliminary and upon coder review may  be revised to meet current compliance requirements. Gatha Mayer, MD 02/05/2022 2:36:31 PM This report has been signed electronically. Number of Addenda: 0

## 2022-02-05 NOTE — Interval H&P Note (Signed)
History and Physical Interval Note:  02/05/2022 1:09 PM  Stephanie Ritter  has presented today for surgery, with the diagnosis of cholangitis, choledocholithiasis.  The various methods of treatment have been discussed with the patient and family. After consideration of risks, benefits and other options for treatment, the patient has consented to  Procedure(s): ENDOSCOPIC RETROGRADE CHOLANGIOPANCREATOGRAPHY (ERCP) (N/A) as a surgical intervention.  The patient's history has been reviewed, patient examined, no change in status, stable for surgery.  I have reviewed the patient's chart and labs.  Questions were answered to the patient's satisfaction.     Silvano Rusk

## 2022-02-05 NOTE — Anesthesia Preprocedure Evaluation (Addendum)
Anesthesia Evaluation  Patient identified by MRN, date of birth, ID band Patient awake    Reviewed: Allergy & Precautions, NPO status , Patient's Chart, lab work & pertinent test results, reviewed documented beta blocker date and time   History of Anesthesia Complications (+) Emergence Delirium and history of anesthetic complications  Airway Mallampati: III  TM Distance: >3 FB Neck ROM: Full    Dental no notable dental hx. (+) Dental Advisory Given, Caps   Pulmonary former smoker,    Pulmonary exam normal breath sounds clear to auscultation       Cardiovascular hypertension, Pt. on medications +CHF  Normal cardiovascular exam+ dysrhythmias Atrial Fibrillation  Rhythm:Regular Rate:Normal  Chronic atrial fibrillation- on pacerone  EKG 02/04/22 NSR, 1st deg AV block, Incomplete LBBB pattern  Echo 01/23/2017 from [facility] LVEF 70%, dilated LA   Neuro/Psych PSYCHIATRIC DISORDERS Depression negative neurological ROS     GI/Hepatic (+) Hepatitis -Cholangitis Elevated LFT's Intra and extrahepatic biliary dilation Biliary pancreatitis Choledocholithiasis with possible GB mass   Endo/Other  negative endocrine ROS  Renal/GU Renal InsufficiencyRenal disease  negative genitourinary   Musculoskeletal negative musculoskeletal ROS (+)   Abdominal   Peds  Hematology Eliquis therapy- last dose 7/7   Anesthesia Other Findings   Reproductive/Obstetrics                           Anesthesia Physical Anesthesia Plan  ASA: 3 and emergent  Anesthesia Plan: General   Post-op Pain Management: Minimal or no pain anticipated   Induction: Intravenous, Rapid sequence and Cricoid pressure planned  PONV Risk Score and Plan: 4 or greater and Treatment may vary due to age or medical condition and Ondansetron  Airway Management Planned: Oral ETT  Additional Equipment: None  Intra-op Plan:    Post-operative Plan: Extubation in OR  Informed Consent: I have reviewed the patients History and Physical, chart, labs and discussed the procedure including the risks, benefits and alternatives for the proposed anesthesia with the patient or authorized representative who has indicated his/her understanding and acceptance.   Patient has DNR.  Discussed DNR with patient and Suspend DNR.   Dental advisory given  Plan Discussed with: CRNA and Anesthesiologist  Anesthesia Plan Comments:        Anesthesia Quick Evaluation

## 2022-02-05 NOTE — Progress Notes (Signed)
Assessment & Plan: HD#3 - Cholecystitis, cholelithiasis, choledocholithiasis with distal biliary obstruction Chronic anticoagulation on Eliquis Atrial fibrillation Dementia  Patient admitted to medical service - Dr. Sidney Ace and Dr. Cruzita Lederer Still in ER - awaiting bed - daughter at bedside IV Zosyn NPO AM labs - Tbili now 3.0, WBC 14K Consult to gastroenterology - Dr. Carlean Purl to perform ERCP today Hold Eliquis in anticipation of procedure   Will follow with you.        Armandina Gemma, MD [facility] A [place] practice Office: [phone]        Chief Complaint: Abdominal pain  Subjective: Patient on stretcher, daughter at bedside.  abdominal pain, improved.  Objective: Vital signs in last 24 hours: Temp:  [97.9 F (36.6 C)-98.8 F (37.1 C)] 97.9 F (36.6 C) (07/09 0506) Pulse Rate:  [79-107] 107 (07/09 0506) Resp:  [16-22] 18 (07/09 0506) BP: (124-152)/(55-82) 128/69 (07/09 0506) SpO2:  [92 %-99 %] 99 % (07/08 1445) Weight:  [71 kg] 71 kg (07/08 1600) Last BM Date : 02/04/22  Intake/Output from previous day: 07/08 0701 - 07/09 0700 In: 1132.7 [I.V.:1000; IV Piggyback:132.7] Out: -  Intake/Output this shift: No intake/output data recorded.  Physical Exam: HEENT - sclerae clear, mucous membranes moist Neck - soft Abdomen - soft, mild distension; tender RUQ, palpable mass  Lab Results:  Recent Labs    02/04/22 0532 02/05/22 0626  WBC 17.8* 14.4*  HGB 13.0 12.8  HCT 39.3 39.0  PLT 104* 98*    BMET Recent Labs    02/04/22 0532 02/05/22 0626  NA 136 137  K 3.6 3.1*  CL 103 104  CO2 22 23  GLUCOSE 92 61*  BUN 27* 19  CREATININE 1.13* 0.94  CALCIUM 8.0* 7.8*    PT/INR No results for input(s): "LABPROT", "INR" in the last 72 hours. Comprehensive Metabolic Panel:    Component Value Date/Time   NA 137 02/05/2022 0626   NA 136 02/04/2022 0532   NA 139 06/05/2017 1446   K 3.1 (L) 02/05/2022 0626   K 3.6 02/04/2022 0532   CL  104 02/05/2022 0626   CL 103 02/04/2022 0532   CO2 23 02/05/2022 0626   CO2 22 02/04/2022 0532   BUN 19 02/05/2022 0626   BUN 27 (H) 02/04/2022 0532   BUN 28 (H) 06/05/2017 1446   CREATININE 0.94 02/05/2022 0626   CREATININE 1.13 (H) 02/04/2022 0532   GLUCOSE 61 (L) 02/05/2022 0626   GLUCOSE 92 02/04/2022 0532   CALCIUM 7.8 (L) 02/05/2022 0626   CALCIUM 8.0 (L) 02/04/2022 0532   AST 98 (H) 02/05/2022 0626   AST 256 (H) 02/04/2022 0532   ALT 190 (H) 02/05/2022 0626   ALT 329 (H) 02/04/2022 0532   ALKPHOS 134 (H) 02/05/2022 0626   ALKPHOS 133 (H) 02/04/2022 0532   BILITOT 3.0 (H) 02/05/2022 0626   BILITOT 4.9 (H) 02/04/2022 0532   BILITOT 0.4 06/05/2017 1446   PROT 5.6 (L) 02/05/2022 0626   PROT 6.2 (L) 02/04/2022 0532   PROT 6.4 06/05/2017 1446   ALBUMIN 2.8 (L) 02/05/2022 0626   ALBUMIN 3.2 (L) 02/04/2022 0532   ALBUMIN 3.8 06/05/2017 1446    Studies/Results: US Abdomen Limited RUQ (LIVER/GB)  Result Date: 02/03/2022 CLINICAL DATA:  Elevated LFT EXAM: ULTRASOUND ABDOMEN LIMITED RIGHT UPPER QUADRANT COMPARISON:  CT 02/03/2022 FINDINGS: Gallbladder: Markedly dilated. Moderate sludge within the gallbladder. No shadowing stones. Echogenic area within the lumen of the gallbladder containing vascularity suggesting solid tissue. Negative sonographic Murphy.  Common bile duct: Diameter: 13 mm Liver: Positive for intra hepatic biliary dilatation. No focal hepatic abnormality. Portal vein is patent on color Doppler imaging with normal direction of blood flow towards the liver. Other: None. IMPRESSION: 1. Severe gallbladder distension to the level of right lower quadrant. Hyper and hypoechoic material is present within the gallbladder lumen, this appears to contain vascularity and raises concern for gallbladder mass lesion. Surgical consultation is recommended. Negative sonographic Murphy 2. Severe intra and extrahepatic biliary dilatation. Electronically Signed   By: Donavan Foil M.D.   On:  02/03/2022 18:20   CT Abdomen Pelvis W Contrast  Result Date: 02/03/2022 CLINICAL DATA:  Abdominal pain, nausea, vomiting, elevated liver enzymes EXAM: CT ABDOMEN AND PELVIS WITH CONTRAST TECHNIQUE: Multidetector CT imaging of the abdomen and pelvis was performed using the standard protocol following bolus administration of intravenous contrast. RADIATION DOSE REDUCTION: This exam was performed according to the departmental dose-optimization program which includes automated exposure control, adjustment of the mA and/or kV according to patient size and/or use of iterative reconstruction technique. CONTRAST:  114m OMNIPAQUE IOHEXOL 300 MG/ML  SOLN COMPARISON:  01/06/2019 FINDINGS: Lower chest: Cardiomegaly. Coronary artery calcifications. Trace bilateral pleural effusions. Irregular scarring and or atelectasis in the bilateral lung bases. Hepatobiliary: No solid liver abnormality is seen. Severe intra and extrahepatic biliary ductal dilatation, central common bile duct measuring up to 1.7 cm in caliber. There is a faintly calcified calculus near the ampulla measuring 0.9 cm (series 4, image 24). Faintly calcified sludge and or additional gallstones in the gallbladder. Pancreas: Unremarkable. No pancreatic ductal dilatation or surrounding inflammatory changes. Spleen: Normal in size without significant abnormality. Adrenals/Urinary Tract: Adrenal glands are unremarkable. Simple, benign bilateral renal cortical cysts, for which no further follow-up or characterization is required. Kidneys are otherwise normal, without renal calculi, solid lesion, or hydronephrosis. Bladder is unremarkable. Stomach/Bowel: Stomach is within normal limits. Appendix not clearly visualized. No evidence of bowel wall thickening, distention, or inflammatory changes. Severe pancolonic diverticulosis. Vascular/Lymphatic: Aortic atherosclerosis. No enlarged abdominal or pelvic lymph nodes. Reproductive: No mass or other significant  abnormality. Other: No abdominal wall hernia or abnormality. No ascites. Musculoskeletal: No acute or significant osseous findings. Status post bilateral hip arthroplasty. IMPRESSION: 1. Severe intra and extrahepatic biliary ductal dilatation, central common bile duct measuring up to 1.7 cm in caliber. There is a faintly calcified calculus near the ampulla measuring 0.9 cm. Faintly calcified sludge and or additional gallstones in the distended gallbladder. Findings are consistent with choledocholithiasis. 2. Severe pancolonic diverticulosis without evidence of acute diverticulitis. 3. Cardiomegaly and coronary artery disease. 4. Trace bilateral pleural effusions. Aortic Atherosclerosis (ICD10-I70.0). Electronically Signed   By: ADelanna AhmadiM.D.   On: [id number]     ARosario [id number]  Patient ID: LRoderic Ritter female   DOB: 404/07/1926 88y.o.   MRN: [id number]

## 2022-02-05 NOTE — Anesthesia Procedure Notes (Signed)
Procedure Name: Intubation Date/Time: 02/05/2022 1:44 PM  Performed by: Claudia Desanctis, CRNAPre-anesthesia Checklist: Patient identified, Emergency Drugs available, Suction available and Patient being monitored Patient Re-evaluated:Patient Re-evaluated prior to induction Oxygen Delivery Method: Circle system utilized Preoxygenation: Pre-oxygenation with 100% oxygen Induction Type: IV induction Ventilation: Mask ventilation without difficulty Laryngoscope Size: 2 and Miller Grade View: Grade II Tube type: Oral Tube size: 7.0 mm Number of attempts: 1 Airway Equipment and Method: Stylet Placement Confirmation: ETT inserted through vocal cords under direct vision, positive ETCO2 and breath sounds checked- equal and bilateral Secured at: 20 cm Tube secured with: Tape Dental Injury: Teeth and Oropharynx as per pre-operative assessment

## 2022-02-05 NOTE — Progress Notes (Signed)
PROGRESS NOTE  Stephanie Ritter OZD:[id number] DOB: 10-13-1926 DOA: 02/03/2022 PCP: Lajean Manes, MD   LOS: 2 days   Brief Narrative / Interim history: 88 year old female with history of PAF on Eliquis, HTN, who comes into the hospital with abdominal pain with nausea and vomiting for the past day prior to presentation.  She was found to have elevated LFTs, choledocholithiasis/cholecystitis.  GI and general surgery were consulted  Subjective / 24h Interval events: No nausea.  Awaiting ERCP this morning  Assesement and Plan: Principal Problem:   Choledocholithiasis Active Problems:   Acute hepatitis   Persistent atrial fibrillation (HCC)   Essential hypertension   Cholelithiasis with cholecystitis   Anticoagulated   Gallbladder mass   Depression   Vitamin B12 deficiency  Principal problem Choledocholithiasis, elevated LFTs-symptomatic with abdominal pain, nausea.  Gastroenterology as well as general surgery consulted and following.  She has been placed on antibiotics with Zosyn, continue.  She is to undergo an ERCP, surgical plans to be determined  Active problems Persistent A-fib-Eliquis on hold.  Last dose 7/7 at noon.  Continue amiodarone, diltiazem  Questionable gallbladder mass-General surgery following  Essential hypertension-continue Cardizem  B12 deficiency-continue B12  Scheduled Meds:  [START ON 02/06/2022] amiodarone  100 mg Oral Q M,W,F   cholecalciferol  2,000 Units Oral q morning   diltiazem  300 mg Oral Daily   enoxaparin (LOVENOX) injection  40 mg Subcutaneous Q24H   escitalopram  20 mg Oral Daily   mirabegron ER  25 mg Oral Daily   multivitamin  1 tablet Oral Daily   vitamin B-12  1,000 mcg Oral Daily   Continuous Infusions:  sodium chloride 100 mL/hr at 02/05/22 0508   sodium chloride Stopped (02/04/22 1521)   piperacillin-tazobactam (ZOSYN)  IV 3.375 g (02/05/22 0757)   potassium chloride 10 mEq (02/05/22 1056)   PRN Meds:.acetaminophen  **OR** acetaminophen, ipratropium, magnesium hydroxide, morphine injection, ondansetron **OR** ondansetron (ZOFRAN) IV, tetrahydrozoline, traZODone  Diet Orders (From admission, onward)     Start     Ordered   02/05/22 0957  Diet NPO time specified Except for: Sips with Meds  Diet effective midnight       Question:  Except for  Answer:  Sips with Meds   02/05/22 0957            DVT prophylaxis: enoxaparin (LOVENOX) injection 40 mg Start: 02/04/22 1000   Lab Results  Component Value Date   PLT 98 (L) 02/05/2022      Code Status: DNR  Family Communication: Daughter at bedside  Status is: Inpatient  Remains inpatient appropriate because: Severity of illness   Level of care: Telemetry  Consultants:  GI Surgery   Objective: Vitals:   02/04/22 1445 02/04/22 1600 02/04/22 1837 02/05/22 0506  BP: 134/61  (!) 141/60 128/69  Pulse: 79  86 (!) 107  Resp: (!) 22  (!) 22 18  Temp: 98.2 F (36.8 C)  98.8 F (37.1 C) 97.9 F (36.6 C)  TempSrc: Oral  Oral Oral  SpO2: 99%     Weight:  71 kg    Height:  '5\' 4"'$  (1.626 m)      Intake/Output Summary (Last 24 hours) at 02/05/2022 1132 Last data filed at 02/05/2022 1000 Gross per 24 hour  Intake 1132.68 ml  Output --  Net 1132.68 ml   Wt Readings from Last 3 Encounters:  02/04/22 71 kg  08/31/21 69.9 kg  11/23/20 76.3 kg    Examination:  .Constitutional: NAD Eyes: lids and  conjunctivae normal, no scleral icterus ENMT: mmm Neck: normal, supple Respiratory: clear to auscultation bilaterally, no wheezing, no crackles.  Cardiovascular: Regular rate and rhythm, no murmurs / rubs / gallops. No LE edema. Abdomen: soft, no distention, mild tenderness throughout without guarding or rebound Neurologic: no focal deficits, equal strength   Data Reviewed: I have independently reviewed following labs and imaging studies   CBC Recent Labs  Lab 02/03/22 1417 02/04/22 0532 02/05/22 0626  WBC 16.9* 17.8* 14.4*  HGB 14.1 13.0  12.8  HCT 43.0 39.3 39.0  PLT 155 104* 98*  MCV 95.3 94.2 94.7  MCH 31.3 31.2 31.1  MCHC 32.8 33.1 32.8  RDW 14.6 15.1 15.0     Recent Labs  Lab 02/03/22 1417 02/04/22 0532 02/05/22 0626  NA 134* 136 137  K 3.8 3.6 3.1*  CL 101 103 104  CO2 '24 22 23  '$ GLUCOSE 140* 92 61*  BUN 25* 27* 19  CREATININE 0.84 1.13* 0.94  CALCIUM 8.4* 8.0* 7.8*  AST 546* 256* 98*  ALT 502* 329* 190*  ALKPHOS 150* 133* 134*  BILITOT 4.2* 4.9* 3.0*  ALBUMIN 3.5 3.2* 2.8*  MG  --   --  2.2     ------------------------------------------------------------------------------------------------------------------ No results for input(s): "CHOL", "HDL", "LDLCALC", "TRIG", "CHOLHDL", "LDLDIRECT" in the last 72 hours.  No results found for: "HGBA1C" ------------------------------------------------------------------------------------------------------------------ No results for input(s): "TSH", "T4TOTAL", "T3FREE", "THYROIDAB" in the last 72 hours.  Invalid input(s): "FREET3"  Cardiac Enzymes No results for input(s): "CKMB", "TROPONINI", "MYOGLOBIN" in the last 168 hours.  Invalid input(s): "CK" ------------------------------------------------------------------------------------------------------------------ No results found for: "BNP"  CBG: No results for input(s): "GLUCAP" in the last 168 hours.  No results found for this or any previous visit (from the past 240 hour(s)).   Radiology Studies: No results found.   Marzetta Board, MD, PhD [facility]  Between 7 am - 7 pm I am available, please contact me via Amion (for emergencies) or Securechat (non urgent messages)  Between 7 pm - 7 am I am not available, please contact night coverage MD/APP via Amion

## 2022-02-06 DIAGNOSIS — K8050 Calculus of bile duct without cholangitis or cholecystitis without obstruction: Secondary | ICD-10-CM | POA: Diagnosis not present

## 2022-02-06 LAB — COMPREHENSIVE METABOLIC PANEL
ALT: 130 U/L — ABNORMAL HIGH (ref 0–44)
AST: 44 U/L — ABNORMAL HIGH (ref 15–41)
Albumin: 2.6 g/dL — ABNORMAL LOW (ref 3.5–5.0)
Alkaline Phosphatase: 123 U/L (ref 38–126)
Anion gap: 9 (ref 5–15)
BUN: 19 mg/dL (ref 8–23)
CO2: 23 mmol/L (ref 22–32)
Calcium: 8 mg/dL — ABNORMAL LOW (ref 8.9–10.3)
Chloride: 107 mmol/L (ref 98–111)
Creatinine, Ser: 0.84 mg/dL (ref 0.44–1.00)
GFR, Estimated: >60 mL/min (ref >60)
Glucose, Bld: 79 mg/dL (ref 70–99)
Potassium: 4.1 mmol/L (ref 3.5–5.1)
Sodium: 139 mmol/L (ref 135–145)
Total Bilirubin: 2.8 mg/dL — ABNORMAL HIGH (ref 0.3–1.2)
Total Protein: 5.6 g/dL — ABNORMAL LOW (ref 6.5–8.1)

## 2022-02-06 LAB — CBC
HCT: 39.6 % (ref 36.0–46.0)
Hemoglobin: 12.8 g/dL (ref 12.0–15.0)
MCH: 31.1 pg (ref 26.0–34.0)
MCHC: 32.3 g/dL (ref 30.0–36.0)
MCV: 96.1 fL (ref 80.0–100.0)
Platelets: 111 10*3/uL — ABNORMAL LOW (ref 150–400)
RBC: 4.12 MIL/uL (ref 3.87–5.11)
RDW: 15.2 % (ref 11.5–15.5)
WBC: 11.3 10*3/uL — ABNORMAL HIGH (ref 4.0–10.5)
nRBC: 0 % (ref 0.0–0.2)

## 2022-02-06 NOTE — Progress Notes (Signed)
  Transition of Care [facility]) Screening Note   Patient Details  Name: Stephanie Ritter Date of Birth: November 09, 1926   [facility] of Care [facility]) CM/SW Contact:    Lennart Pall, LCSW Phone Number: 02/06/2022, 10:18 AM    Transition of Care Department [facility]) has reviewed patient and no TOC needs have been identified at this time. We will continue to monitor patient advancement through interdisciplinary progression rounds. If new patient transition needs arise, please place a TOC consult.

## 2022-02-06 NOTE — Progress Notes (Signed)
PROGRESS NOTE  Stephanie Ritter HCW:[id number] DOB: 1926/10/25 DOA: 02/03/2022 PCP: Lajean Manes, MD   LOS: 3 days   Brief Narrative / Interim history: 88 year old female with history of PAF on Eliquis, HTN, who comes into the hospital with abdominal pain with nausea and vomiting for the past day prior to presentation.  She was found to have elevated LFTs, choledocholithiasis/cholecystitis.  GI and general surgery were consulted  Subjective / 24h Interval events: No nausea, no vomiting.  Some right upper quadrant soreness persists  Assesement and Plan: Principal Problem:   Choledocholithiasis Active Problems:   Acute hepatitis   Persistent atrial fibrillation (HCC)   Essential hypertension   Cholelithiasis with cholecystitis   Anticoagulated   Gallbladder mass   Depression   Vitamin B12 deficiency  Principal problem Choledocholithiasis, elevated LFTs-symptomatic with abdominal pain, nausea.  Gastroenterology as well as general surgery consulted and following.  She has been placed on antibiotics with Zosyn, continue.  She underwent an ERCP 7/9 for her obstructive choledocholithiasis, status post complete removal with biliary sphincterotomy and balloon extraction.  Active problems Persistent A-fib-Eliquis on hold.  Last dose 7/7 at noon.  Continue amiodarone, diltiazem  Questionable gallbladder mass-General surgery following  Essential hypertension-continue Cardizem  B12 deficiency-continue B12  Scheduled Meds:  amiodarone  100 mg Oral Q M,W,F   cholecalciferol  2,000 Units Oral q morning   diltiazem  300 mg Oral Daily   enoxaparin (LOVENOX) injection  40 mg Subcutaneous Q24H   escitalopram  20 mg Oral Daily   mirabegron ER  25 mg Oral Daily   multivitamin  1 tablet Oral Daily   vitamin B-12  1,000 mcg Oral Daily   Continuous Infusions:  sodium chloride 100 mL/hr at 02/05/22 0508   piperacillin-tazobactam (ZOSYN)  IV 3.375 g (02/06/22 0956)   PRN  Meds:.acetaminophen **OR** acetaminophen, ipratropium, magnesium hydroxide, morphine injection, ondansetron **OR** ondansetron (ZOFRAN) IV, tetrahydrozoline, traZODone  Diet Orders (From admission, onward)     Start     Ordered   02/06/22 1146  DIET SOFT Room service appropriate? Yes; Fluid consistency: Thin  Diet effective now       Question Answer Comment  Room service appropriate? Yes   Fluid consistency: Thin      02/06/22 1145            DVT prophylaxis: enoxaparin (LOVENOX) injection 40 mg Start: 02/04/22 1000   Lab Results  Component Value Date   PLT 111 (L) 02/06/2022      Code Status: DNR  Family Communication: Daughter at bedside  Status is: Inpatient  Remains inpatient appropriate because: Severity of illness   Level of care: Telemetry  Consultants:  GI Surgery   Objective: Vitals:   02/05/22 1836 02/05/22 2223 02/06/22 0534 02/06/22 0956  BP: (!) 131/55 137/76 125/68   Pulse: 92 89 77   Resp: '17 18 18   '$ Temp: 98.3 F (36.8 C) 97.6 F (36.4 C) 97.7 F (36.5 C)   TempSrc: Oral Oral Oral   SpO2: 95% 96% 95% 94%  Weight:   73.5 kg   Height:        Intake/Output Summary (Last 24 hours) at 02/06/2022 1244 Last data filed at 02/05/2022 1441 Gross per 24 hour  Intake 1000 ml  Output 0 ml  Net 1000 ml    Wt Readings from Last 3 Encounters:  02/06/22 73.5 kg  08/31/21 69.9 kg  11/23/20 76.3 kg    Examination:  Constitutional: NAD Eyes: lids and conjunctivae normal, no scleral  icterus ENMT: mmm Neck: normal, supple Respiratory: clear to auscultation bilaterally, no wheezing, no crackles. Normal respiratory effort.  Cardiovascular: Regular rate and rhythm, no murmurs / rubs / gallops. No LE edema. Abdomen: soft, mild tenderness to palpation in the right upper quadrant Skin: no rashes Neurologic: no focal deficits, equal strength   Data Reviewed: I have independently reviewed following labs and imaging studies   CBC Recent Labs  Lab  02/03/22 1417 02/04/22 0532 02/05/22 0626 02/06/22 0626  WBC 16.9* 17.8* 14.4* 11.3*  HGB 14.1 13.0 12.8 12.8  HCT 43.0 39.3 39.0 39.6  PLT 155 104* 98* 111*  MCV 95.3 94.2 94.7 96.1  MCH 31.3 31.2 31.1 31.1  MCHC 32.8 33.1 32.8 32.3  RDW 14.6 15.1 15.0 15.2     Recent Labs  Lab 02/03/22 1417 02/04/22 0532 02/05/22 0626 02/06/22 0626  NA 134* 136 137 139  K 3.8 3.6 3.1* 4.1  CL 101 103 104 107  CO2 '24 22 23 23  '$ GLUCOSE 140* 92 61* 79  BUN 25* 27* 19 19  CREATININE 0.84 1.13* 0.94 0.84  CALCIUM 8.4* 8.0* 7.8* 8.0*  AST 546* 256* 98* 44*  ALT 502* 329* 190* 130*  ALKPHOS 150* 133* 134* 123  BILITOT 4.2* 4.9* 3.0* 2.8*  ALBUMIN 3.5 3.2* 2.8* 2.6*  MG  --   --  2.2  --      ------------------------------------------------------------------------------------------------------------------ No results for input(s): "CHOL", "HDL", "LDLCALC", "TRIG", "CHOLHDL", "LDLDIRECT" in the last 72 hours.  No results found for: "HGBA1C" ------------------------------------------------------------------------------------------------------------------ No results for input(s): "TSH", "T4TOTAL", "T3FREE", "THYROIDAB" in the last 72 hours.  Invalid input(s): "FREET3"  Cardiac Enzymes No results for input(s): "CKMB", "TROPONINI", "MYOGLOBIN" in the last 168 hours.  Invalid input(s): "CK" ------------------------------------------------------------------------------------------------------------------ No results found for: "BNP"  CBG: Recent Labs  Lab 02/05/22 1309 02/05/22 1441  GLUCAP 60* 104*    No results found for this or any previous visit (from the past 240 hour(s)).   Radiology Studies: DG ERCP  Result Date: 02/06/2022 CLINICAL DATA:  Biliary stones. EXAM: ERCP TECHNIQUE: Multiple spot images obtained with the fluoroscopic device and submitted for interpretation post-procedure. FLUOROSCOPY TIME: FLUOROSCOPY TIME 2 minutes, 52 seconds (43 mGy) COMPARISON:  CT abdomen  and pelvis-02/03/2022 FINDINGS: Six spot fluoroscopic images the right upper abdominal quadrant during ERCP are provided for review. Initial image demonstrates an ERCP probe overlying the right upper abdominal quadrant. Subsequent images demonstrate selective cannulation and opacification of the common bile duct which appears mildly dilated. Subsequent images demonstrate insufflation of a balloon within distal aspect of the CBD with subsequent biliary sweeping and presumed sphincterotomy. There is minimal opacification of the intrahepatic biliary tree which appears moderately dilated. There is opacification of the cystic duct as well faint opacification of the gallbladder lumen. IMPRESSION: ERCP with biliary sweeping and presumed sphincterotomy as above. These images were submitted for radiologic interpretation only. Please see the procedural report for the amount of contrast and the fluoroscopy time utilized. Electronically Signed   By: Sandi Mariscal M.D.   On: 02/06/2022 08:34     Marzetta Board, MD, PhD [facility]  Between 7 am - 7 pm I am available, please contact me via Amion (for emergencies) or Securechat (non urgent messages)  Between 7 pm - 7 am I am not available, please contact night coverage MD/APP via Amion

## 2022-02-06 NOTE — Progress Notes (Signed)
Patient ID: Stephanie Ritter, female   DOB: Sep 13, 1926, 88 y.o.   MRN: [id number] [facility] Surgery Progress Note  1 Day Post-Op  Subjective: CC-  Daughter at bedside. Patient states she has no abdominal pain, n/v. She has tolerated some water since ERCP yesterday. WBC down 11.3, afebrile.  Objective: Vital signs in last 24 hours: Temp:  [96.7 F (35.9 C)-98.5 F (36.9 C)] 97.7 F (36.5 C) (07/10 0534) Pulse Rate:  [64-110] 77 (07/10 0534) Resp:  [15-20] 18 (07/10 0534) BP: (97-146)/(50-83) 125/68 (07/10 0534) SpO2:  [92 %-99 %] 94 % (07/10 0956) Weight:  [71 kg-73.5 kg] 73.5 kg (07/10 0534) Last BM Date : 02/04/22  Intake/Output from previous day: 07/09 0701 - 07/10 0700 In: 1000 [I.V.:1000] Out: 0  Intake/Output this shift: No intake/output data recorded.  PE: Gen:  Alert, NAD, pleasant Abd: soft, TTP RUQ  Lab Results:  Recent Labs    02/05/22 0626 02/06/22 0626  WBC 14.4* 11.3*  HGB 12.8 12.8  HCT 39.0 39.6  PLT 98* 111*   BMET Recent Labs    02/05/22 0626 02/06/22 0626  NA 137 139  K 3.1* 4.1  CL 104 107  CO2 23 23  GLUCOSE 61* 79  BUN 19 19  CREATININE 0.94 0.84  CALCIUM 7.8* 8.0*   PT/INR No results for input(s): "LABPROT", "INR" in the last 72 hours. CMP     Component Value Date/Time   NA 139 02/06/2022 0626   NA 139 06/05/2017 1446   K 4.1 02/06/2022 0626   CL 107 02/06/2022 0626   CO2 23 02/06/2022 0626   GLUCOSE 79 02/06/2022 0626   BUN 19 02/06/2022 0626   BUN 28 (H) 06/05/2017 1446   CREATININE 0.84 02/06/2022 0626   CALCIUM 8.0 (L) 02/06/2022 0626   PROT 5.6 (L) 02/06/2022 0626   PROT 6.4 06/05/2017 1446   ALBUMIN 2.6 (L) 02/06/2022 0626   ALBUMIN 3.8 06/05/2017 1446   AST 44 (H) 02/06/2022 0626   ALT 130 (H) 02/06/2022 0626   ALKPHOS 123 02/06/2022 0626   BILITOT 2.8 (H) 02/06/2022 0626   BILITOT 0.4 06/05/2017 1446   GFRNONAA >60 02/06/2022 0626   GFRAA >60 11/20/2019 1517   Lipase     Component Value  Date/Time   LIPASE 19 02/03/2022 1417       Studies/Results: DG ERCP  Result Date: 02/06/2022 CLINICAL DATA:  Biliary stones. EXAM: ERCP TECHNIQUE: Multiple spot images obtained with the fluoroscopic device and submitted for interpretation post-procedure. FLUOROSCOPY TIME: FLUOROSCOPY TIME 2 minutes, 52 seconds (43 mGy) COMPARISON:  CT abdomen and pelvis-02/03/2022 FINDINGS: Six spot fluoroscopic images the right upper abdominal quadrant during ERCP are provided for review. Initial image demonstrates an ERCP probe overlying the right upper abdominal quadrant. Subsequent images demonstrate selective cannulation and opacification of the common bile duct which appears mildly dilated. Subsequent images demonstrate insufflation of a balloon within distal aspect of the CBD with subsequent biliary sweeping and presumed sphincterotomy. There is minimal opacification of the intrahepatic biliary tree which appears moderately dilated. There is opacification of the cystic duct as well faint opacification of the gallbladder lumen. IMPRESSION: ERCP with biliary sweeping and presumed sphincterotomy as above. These images were submitted for radiologic interpretation only. Please see the procedural report for the amount of contrast and the fluoroscopy time utilized. Electronically Signed   By: Sandi Mariscal M.D.   On: 02/06/2022 08:34    Anti-infectives: Anti-infectives (From admission, onward)    Start  Dose/Rate Route Frequency Ordered Stop   02/04/22 0800  piperacillin-tazobactam (ZOSYN) IVPB 3.375 g        3.375 g 12.5 mL/hr over 240 Minutes Intravenous Every 8 hours 02/04/22 0227     02/03/22 2315  piperacillin-tazobactam (ZOSYN) IVPB 3.375 g        3.375 g 100 mL/hr over 30 Minutes Intravenous  Once 02/03/22 2313 02/04/22 0020        Assessment/Plan  Choledocholithiasis ?Cholecystitis  - u/s 7/7 shows severe gallbladder distension to the level of right lower quadrant; hyper and hypoechoic  material is present within the gallbladder lumen, this appears to contain vascularity and raises concern for gallbladder mass lesion; Negative sonographic Percell Miller; severe intra and extrahepatic biliary dilatation - s/p ERCP 7/9 with stone and large amounts of sludge and debris  cleared after multiple sweeps, occlusion cholangiogram negative; cystic duct filled and gallbladder filled partially - LFTs trending down today - Leukocytosis also downtrending but patient with some persistent RUQ tenderness on exam. Question some degree of cholecystitis. However, cholangiogram yesterday showed that the cystic duct filled and gallbladder filled partially. Discussed with MD, cystic duct not fully occluded. Will advance diet and continue IV antibiotics.  Discussed plan with patient's daughter. She would like to avoid surgery if possible, and understands the risk of recurrent choledocholithiasis without cholecystectomy. I think this is very reasonable given her age and multiple medical issues, making her high risk for surgery and general anesthesia.  Repeat labs in AM and monitor abdominal exam. Likely plan to send home on a few days of oral antibiotics.  ID - zosyn 7/7>> FEN - soft diet  VTE - lovenox Foley - none  A fib on eliquis HTN Dementia Code status DNR  I reviewed Consultant gastroenterology notes, hospitalist notes, last 24 h vitals and pain scores, last 48 h intake and output, last 24 h labs and trends, and last 24 h imaging results    LOS: 3 days    Wellington Hampshire, [facility] 02/06/2022, 12:33 PM Please see Amion for pager number during day hours 7:00am-4:30pm

## 2022-02-06 NOTE — Evaluation (Signed)
Physical Therapy Evaluation Patient Details Name: Stephanie Ritter MRN: [id number] DOB: 02-21-1927 Today's Date: 02/06/2022  History of Present Illness  Stephanie Ritter is a 88 y.o. presents with abdominal pain, nausea and vomiting. Pt admitted with Choledocholithiasis, s/p ERCP 02/05/22. PMH: CHF, hypertension and atrial fibrillation  Clinical Impression  Pt admitted with above diagnosis. Pt using SPC at baseline for ambulation, able to complete self care tasks, daughter completing household chores, not on home O2. Pt currently completing mobility with min guard-min assist, fatigues quickly with ambulation requiring return to room for seated rest break. Pt educated on body mechanics with STS from EOB and toilet, requiring min A to min guard to power up. Pt initially attempting ot mobilize with [facility] [address] but generally unsteady so transitioned to RW with improved steadiness. Poor waveform reading SpO2 due to tremors, RN aware and obtaining ear probe with plan to recheck due to pt's fatigue after ambulation bout; pt on RA with Spo2 92% once seated in room. Pt currently with functional limitations due to the deficits listed below (see PT Problem List). Pt will benefit from skilled PT to increase their independence and safety with mobility to allow discharge to the venue listed below.          Recommendations for follow up therapy are one component of a multi-disciplinary discharge planning process, led by the attending physician.  Recommendations may be updated based on patient status, additional functional criteria and insurance authorization.  Follow Up Recommendations Home health PT      Assistance Recommended at Discharge Frequent or constant Supervision/Assistance  Patient can return home with the following  A little help with walking and/or transfers;A little help with bathing/dressing/bathroom;Assistance with cooking/housework;Assist for transportation;Help with stairs or ramp for  entrance    Equipment Recommendations None recommended by PT  Recommendations for Other Services       Functional Status Assessment Patient has had a recent decline in their functional status and demonstrates the ability to make significant improvements in function in a reasonable and predictable amount of time.     Precautions / Restrictions Precautions Precautions: Fall Precaution Comments: monitor vitals Restrictions Weight Bearing Restrictions: No      Mobility  Bed Mobility Overal bed mobility: Needs Assistance Bed Mobility: Supine to Sit, Sit to Supine  Supine to sit: Min guard Sit to supine: Min guard  General bed mobility comments: slow to mobilize, uses bedrail to upright trunk into sitting, therapists clearing linen, slow to lift BLE using momentum to return to bed    Transfers Overall transfer level: Needs assistance Equipment used: Rolling walker (2 wheels), Straight cane Transfers: Sit to/from Stand Sit to Stand: Min assist, Min guard  General transfer comment: min A with initial trasnfers, cued for hand placement and using rocking momentum to power up, progressing to min guard for final rep to power up to stand    Ambulation/Gait Ambulation/Gait assistance: Min guard Gait Distance (Feet): 30 Feet Assistive device: Rolling walker (2 wheels) Gait Pattern/deviations: Step-through pattern, Decreased stride length, Trunk flexed Gait velocity: decreased  General Gait Details: short, quick steps with RW, fatigues easily limiting distance, trunk flexed, HR up to 140  Stairs            Wheelchair Mobility    Modified Rankin (Stroke Patients Only)       Balance Overall balance assessment: Needs assistance Sitting-balance support: Feet supported Sitting balance-Leahy Scale: Good  Standing balance support: During functional activity, Bilateral upper extremity supported, Reliant on assistive device  for balance Standing balance-Leahy Scale: Poor        Pertinent Vitals/Pain Pain Assessment Pain Assessment: No/denies pain    Home Living Family/patient expects to be discharged to:: Private residence Living Arrangements: Children Available Help at Discharge: Family;Available 24 hours/day Type of Home: House Home Access: Stairs to enter Entrance Stairs-Rails: Right Entrance Stairs-Number of Steps: 3   Home Layout: One level Home Equipment: Conservation officer, nature (2 wheels);Cane - single point;Grab bars - toilet;Grab bars - tub/shower      Prior Function Prior Level of Function : Independent/Modified Independent  Mobility Comments: pt using SPC in the home and community ADLs Comments: pt ind with self care, daughter completes household chores     Hand Dominance        Extremity/Trunk Assessment   Upper Extremity Assessment Upper Extremity Assessment: Overall WFL for tasks assessed    Lower Extremity Assessment Lower Extremity Assessment: Generalized weakness (AROM WNL, strength grossly 3+/5)    Cervical / Trunk Assessment Cervical / Trunk Assessment: Kyphotic  Communication   Communication: HOH  Cognition Arousal/Alertness: Awake/alert Behavior During Therapy: WFL for tasks assessed/performed Overall Cognitive Status: Within Functional Limits for tasks assessed  General Comments: pt pleasant, follows commands, minimally conversational        General Comments General comments (skin integrity, edema, etc.): Pt on RA with SpO2 varying 85-95% on RA, poor waveform due to tremors- RN aware and getting ear probe monitor, pt denies SOB and reports "just tired"    Exercises     Assessment/Plan    PT Assessment Patient needs continued PT services  PT Problem List Decreased strength;Decreased activity tolerance;Decreased balance;Decreased mobility;Decreased knowledge of use of DME;Cardiopulmonary status limiting activity       PT Treatment Interventions DME instruction;Gait training;Stair training;Functional mobility  training;Patient/family education;Therapeutic activities;Therapeutic exercise;Balance training    PT Goals (Current goals can be found in the Care Plan section)  Acute Rehab PT Goals Patient Stated Goal: return home with family support PT Goal Formulation: With patient/family Time For Goal Achievement: 02/20/22 Potential to Achieve Goals: Good    Frequency Min 3X/week     Co-evaluation               AM-PAC PT "6 Clicks" Mobility  Outcome Measure Help needed turning from your back to your side while in a flat bed without using bedrails?: A Little Help needed moving from lying on your back to sitting on the side of a flat bed without using bedrails?: A Little Help needed moving to and from a bed to a chair (including a wheelchair)?: A Little Help needed standing up from a chair using your arms (e.g., wheelchair or bedside chair)?: A Little Help needed to walk in hospital room?: A Lot Help needed climbing 3-5 steps with a railing? : A Lot 6 Click Score: 16    End of Session Equipment Utilized During Treatment: Gait belt Activity Tolerance: Patient tolerated treatment well;Patient limited by fatigue Patient left: in bed;with call bell/phone within reach;with family/visitor present Nurse Communication: Mobility status;Other (comment) (SpO2) PT Visit Diagnosis: Unsteadiness on feet (R26.81);Other abnormalities of gait and mobility (R26.89);Muscle weakness (generalized) (M62.81)    Time: 0086-7619 PT Time Calculation (min) (ACUTE ONLY): 26 min   Charges:   PT Evaluation $PT Eval Low Complexity: 1 Low PT Treatments $Therapeutic Activity: 8-22 mins         Tori Haddy Mullinax PT, DPT 02/06/22, 3:23 PM

## 2022-02-06 NOTE — Progress Notes (Signed)
[facility] Gastroenterology Progress Note  Quaneshia Wareing Taher 88 y.o. [birth year]  CC: Choledocholithiasis   Subjective: Daughter states that patient feels much better.  Patient denies abdominal pain, nausea, vomiting  ROS : Review of Systems  Unable to perform ROS: Dementia      Objective: Vital signs in last 24 hours: Vitals:   02/05/22 2223 02/06/22 0534  BP: 137/76 125/68  Pulse: 89 77  Resp: 18 18  Temp: 97.6 F (36.4 C) 97.7 F (36.5 C)  SpO2: 96% 95%    Physical Exam:  General:  Alert, cooperative, no distress, appears stated age  Head:  Normocephalic, without obvious abnormality, atraumatic  Eyes:  Anicteric sclera, EOM's intact  Lungs:   Clear to auscultation bilaterally, respirations unlabored  Heart:  Regular rate and rhythm, S1, S2 normal  Abdomen:   Soft, non-tender, bowel sounds active all four quadrants,  no masses,     Lab Results: Recent Labs    02/05/22 0626 02/06/22 0626  NA 137 139  K 3.1* 4.1  CL 104 107  CO2 23 23  GLUCOSE 61* 79  BUN 19 19  CREATININE 0.94 0.84  CALCIUM 7.8* 8.0*  MG 2.2  --    Recent Labs    02/05/22 0626 02/06/22 0626  AST 98* 44*  ALT 190* 130*  ALKPHOS 134* 123  BILITOT 3.0* 2.8*  PROT 5.6* 5.6*  ALBUMIN 2.8* 2.6*   Recent Labs    02/05/22 0626 02/06/22 0626  WBC 14.4* 11.3*  HGB 12.8 12.8  HCT 39.0 39.6  MCV 94.7 96.1  PLT 98* 111*   No results for input(s): "LABPROT", "INR" in the last 72 hours.    Assessment Choledocholithiasis, elevated LFTs -RUQ ultrasound 7/7 shows severe gallbladder distention -AST 44/ALT 130/alk phos 123, trending down -T. bili 2.8 (3.0 -Improvement in leukocytosis with WBC 11.3 (14.4) -S/p ERCP 7/9 with Dr. Carlean Purl: Choledocholithiasis with an obstruction was found.  Complete removal was accomplished by biliary sphincterotomy and balloon extraction.  Biliary tree moderately dilated.  Duodenal diverticulum.   Plan: LFTs continue to improve s/p successful ERCP 7/9  with choledocholithiasis removal. Appreciate surgical assistance.  If no recommendations for cholecystectomy, can resume blood thinner. [facility] will sign off. Please contact us if we can be of any further assistance during this hospital stay.   Garnette Scheuermann PA-C 02/06/2022, 9:55 AM  Contact #  [phone]

## 2022-02-07 ENCOUNTER — Encounter (HOSPITAL_COMMUNITY): Payer: Self-pay | Admitting: Internal Medicine

## 2022-02-07 DIAGNOSIS — K8050 Calculus of bile duct without cholangitis or cholecystitis without obstruction: Secondary | ICD-10-CM | POA: Diagnosis not present

## 2022-02-07 LAB — CBC
HCT: 39.7 % (ref 36.0–46.0)
Hemoglobin: 13 g/dL (ref 12.0–15.0)
MCH: 30.9 pg (ref 26.0–34.0)
MCHC: 32.7 g/dL (ref 30.0–36.0)
MCV: 94.3 fL (ref 80.0–100.0)
Platelets: 120 10*3/uL — ABNORMAL LOW (ref 150–400)
RBC: 4.21 MIL/uL (ref 3.87–5.11)
RDW: 15.2 % (ref 11.5–15.5)
WBC: 6.8 10*3/uL (ref 4.0–10.5)
nRBC: 0 % (ref 0.0–0.2)

## 2022-02-07 LAB — COMPREHENSIVE METABOLIC PANEL
ALT: 85 U/L — ABNORMAL HIGH (ref 0–44)
AST: 24 U/L (ref 15–41)
Albumin: 2.6 g/dL — ABNORMAL LOW (ref 3.5–5.0)
Alkaline Phosphatase: 112 U/L (ref 38–126)
Anion gap: 6 (ref 5–15)
BUN: 18 mg/dL (ref 8–23)
CO2: 27 mmol/L (ref 22–32)
Calcium: 7.9 mg/dL — ABNORMAL LOW (ref 8.9–10.3)
Chloride: 107 mmol/L (ref 98–111)
Creatinine, Ser: 0.7 mg/dL (ref 0.44–1.00)
GFR, Estimated: >60 mL/min (ref >60)
Glucose, Bld: 114 mg/dL — ABNORMAL HIGH (ref 70–99)
Potassium: 3.6 mmol/L (ref 3.5–5.1)
Sodium: 140 mmol/L (ref 135–145)
Total Bilirubin: 2.2 mg/dL — ABNORMAL HIGH (ref 0.3–1.2)
Total Protein: 5.7 g/dL — ABNORMAL LOW (ref 6.5–8.1)

## 2022-02-07 LAB — LIPASE, BLOOD: Lipase: 17 U/L (ref 11–51)

## 2022-02-07 MED ORDER — FUROSEMIDE 10 MG/ML IJ SOLN
20.0000 mg | Freq: Once | INTRAMUSCULAR | Status: AC
  Administered 2022-02-07: 20 mg via INTRAVENOUS
  Filled 2022-02-07: qty 2

## 2022-02-07 MED ORDER — HYDROXYZINE HCL 10 MG/5ML PO SYRP
10.0000 mg | ORAL_SOLUTION | Freq: Three times a day (TID) | ORAL | Status: DC | PRN
  Administered 2022-02-07 – 2022-02-08 (×2): 10 mg via ORAL
  Filled 2022-02-07 (×3): qty 5

## 2022-02-07 NOTE — Care Management Important Message (Signed)
Important Message  Patient Details IM Letter given to family member. Name: Stephanie Ritter MRN: [id number] Date of Birth: Jun 05, 1927   Medicare Important Message Given:  Yes     Kerin Salen 02/07/2022, 10:23 AM

## 2022-02-07 NOTE — Progress Notes (Signed)
PROGRESS NOTE  Stephanie Ritter GUY:[id number] DOB: 06/08/1927 DOA: 02/03/2022 PCP: Lajean Manes, MD   LOS: 4 days   Brief Narrative / Interim history: 88 year old female with history of PAF on Eliquis, HTN, who comes into the hospital with abdominal pain with nausea and vomiting for the past day prior to presentation.  She was found to have elevated LFTs, choledocholithiasis/cholecystitis.  GI and general surgery were consulted, and she is status post ERCP on 7/9.  Subjective / 24h Interval events: Seen twice today.  Initially, this morning, she was feeling well, no abdominal pain, and plans are in place for patient to be discharged, however later on started experiencing right upper quadrant abdominal pain and increased confusion per daughter  Assesement and Plan: Principal Problem:   Choledocholithiasis Active Problems:   Acute hepatitis   Persistent atrial fibrillation ([place])   Essential hypertension   Cholelithiasis with cholecystitis   Anticoagulated   Gallbladder mass   Depression   Vitamin B12 deficiency  Principal problem Choledocholithiasis, elevated LFTs-symptomatic with abdominal pain, nausea.  Gastroenterology as well as general surgery consulted and following.  She has been placed on antibiotics with Zosyn, continue, plan for total of 7 days of antibiotics.  She underwent an ERCP 7/9 for her obstructive choledocholithiasis, status post complete removal with biliary sphincterotomy and balloon extraction. Due to clinical improvement and improvement in her LFTs, WBC, cholecystectomy has now been deferred, however this afternoon she has been having increased right upper quadrant pain.  Surgery to see -Due to increased abdominal pain, check a lipase to rule out post ERCP pancreatitis  Active problems Persistent A-fib-Eliquis on hold.  Last dose 7/7 at noon.  Continue amiodarone, diltiazem.  If no cholecystectomy, resume Eliquis 7/11 per GI.  Awaiting surgery MD input this  afternoon  Acute metabolic encephalopathy, underlying dementia-likely a degree of in-hospital delirium, fluctuating mental status but for most part she is pleasant, easily orientable, and anticipate improvement when she would get home.  Questionable gallbladder mass-General surgery following  Essential hypertension-continue Cardizem  B12 deficiency-continue B12  Scheduled Meds:  amiodarone  100 mg Oral Q M,W,F   cholecalciferol  2,000 Units Oral q morning   diltiazem  300 mg Oral Daily   enoxaparin (LOVENOX) injection  40 mg Subcutaneous Q24H   escitalopram  20 mg Oral Daily   mirabegron ER  25 mg Oral Daily   multivitamin  1 tablet Oral Daily   vitamin B-12  1,000 mcg Oral Daily   Continuous Infusions:  sodium chloride Stopped (02/05/22 1234)   piperacillin-tazobactam (ZOSYN)  IV 3.375 g (02/07/22 0901)   PRN Meds:.acetaminophen **OR** acetaminophen, ipratropium, magnesium hydroxide, morphine injection, ondansetron **OR** ondansetron (ZOFRAN) IV, tetrahydrozoline, traZODone  Diet Orders (From admission, onward)     Start     Ordered   02/06/22 1146  DIET SOFT Room service appropriate? Yes; Fluid consistency: Thin  Diet effective now       Question Answer Comment  Room service appropriate? Yes   Fluid consistency: Thin      02/06/22 1145            DVT prophylaxis: enoxaparin (LOVENOX) injection 40 mg Start: 02/04/22 1000   Lab Results  Component Value Date   PLT 120 (L) 02/07/2022      Code Status: DNR  Family Communication: Daughter at bedside  Status is: Inpatient  Remains inpatient appropriate because: Increased abdominal pain   Level of care: Telemetry  Consultants:  GI Surgery   Objective: Vitals:   02/06/22  1740 02/06/22 1741 02/06/22 2108 02/07/22 0603  BP:   137/69 139/87  Pulse:   89 92  Resp:   17 17  Temp:   98 F (36.7 C) 98 F (36.7 C)  TempSrc:   Oral   SpO2: (!) 88% 96% 96% 93%  Weight:      Height:        Intake/Output  Summary (Last 24 hours) at 02/07/2022 1315 Last data filed at 02/07/2022 0622 Gross per 24 hour  Intake 985.47 ml  Output --  Net 985.47 ml    Wt Readings from Last 3 Encounters:  02/06/22 73.5 kg  08/31/21 69.9 kg  11/23/20 76.3 kg    Examination:  Constitutional: NAD Eyes: lids and conjunctivae normal, no scleral icterus ENMT: mmm Neck: normal, supple Respiratory: clear to auscultation bilaterally, no wheezing, no crackles. Normal respiratory effort.  Cardiovascular: Regular rate and rhythm, no murmurs / rubs / gallops. No LE edema. Abdomen: Right upper quadrant tenderness, no guarding or rebound, bowel sounds positive Skin: no rashes Neurologic: no focal deficits, equal strength   Data Reviewed: I have independently reviewed following labs and imaging studies   CBC Recent Labs  Lab 02/03/22 1417 02/04/22 0532 02/05/22 0626 02/06/22 0626 02/07/22 0638  WBC 16.9* 17.8* 14.4* 11.3* 6.8  HGB 14.1 13.0 12.8 12.8 13.0  HCT 43.0 39.3 39.0 39.6 39.7  PLT 155 104* 98* 111* 120*  MCV 95.3 94.2 94.7 96.1 94.3  MCH 31.3 31.2 31.1 31.1 30.9  MCHC 32.8 33.1 32.8 32.3 32.7  RDW 14.6 15.1 15.0 15.2 15.2     Recent Labs  Lab 02/03/22 1417 02/04/22 0532 02/05/22 0626 02/06/22 0626 02/07/22 0638  NA 134* 136 137 139 140  K 3.8 3.6 3.1* 4.1 3.6  CL 101 103 104 107 107  CO2 '24 22 23 23 27  '$ GLUCOSE 140* 92 61* 79 114*  BUN 25* 27* '19 19 18  '$ CREATININE 0.84 1.13* 0.94 0.84 0.70  CALCIUM 8.4* 8.0* 7.8* 8.0* 7.9*  AST 546* 256* 98* 44* 24  ALT 502* 329* 190* 130* 85*  ALKPHOS 150* 133* 134* 123 112  BILITOT 4.2* 4.9* 3.0* 2.8* 2.2*  ALBUMIN 3.5 3.2* 2.8* 2.6* 2.6*  MG  --   --  2.2  --   --      ------------------------------------------------------------------------------------------------------------------ No results for input(s): "CHOL", "HDL", "LDLCALC", "TRIG", "CHOLHDL", "LDLDIRECT" in the last 72 hours.  No results found for:  "HGBA1C" ------------------------------------------------------------------------------------------------------------------ No results for input(s): "TSH", "T4TOTAL", "T3FREE", "THYROIDAB" in the last 72 hours.  Invalid input(s): "FREET3"  Cardiac Enzymes No results for input(s): "CKMB", "TROPONINI", "MYOGLOBIN" in the last 168 hours.  Invalid input(s): "CK" ------------------------------------------------------------------------------------------------------------------ No results found for: "BNP"  CBG: Recent Labs  Lab 02/05/22 1309 02/05/22 1441  GLUCAP 60* 104*     No results found for this or any previous visit (from the past 240 hour(s)).   Radiology Studies: No results found.   Marzetta Board, MD, PhD [facility]  Between 7 am - 7 pm I am available, please contact me via Amion (for emergencies) or Securechat (non urgent messages)  Between 7 pm - 7 am I am not available, please contact night coverage MD/APP via Amion

## 2022-02-07 NOTE — Progress Notes (Signed)
Patient ID: Stephanie Ritter, female   DOB: [birth year], 88 y.o.   MRN: [id number] [facility] Surgery Progress Note  2 Days Post-Op  Subjective: CC-  Daughter at bedside. Doing well this morning. Denies abdominal pain, n/v. Tolerating diet. WBC normalized and LFTs continue to trend down.  Objective: Vital signs in last 24 hours: Temp:  [97.9 F (36.6 C)-98 F (36.7 C)] 98 F (36.7 C) (07/11 0603) Pulse Rate:  [89-110] 92 (07/11 0603) Resp:  [16-17] 17 (07/11 0603) BP: (128-139)/(69-87) 139/87 (07/11 0603) SpO2:  [88 %-96 %] 93 % (07/11 0603) Last BM Date : 02/06/22  Intake/Output from previous day: 07/10 0701 - 07/11 0700 In: 985.5 [P.O.:120; I.V.:422.7; IV Piggyback:442.7] Out: -  Intake/Output this shift: No intake/output data recorded.  PE: Gen:  Alert, NAD, pleasant Abd: soft, ND, NT  Lab Results:  Recent Labs    02/06/22 0626 02/07/22 0638  WBC 11.3* 6.8  HGB 12.8 13.0  HCT 39.6 39.7  PLT 111* 120*   BMET Recent Labs    02/06/22 0626 02/07/22 0638  NA 139 140  K 4.1 3.6  CL 107 107  CO2 23 27  GLUCOSE 79 114*  BUN 19 18  CREATININE 0.84 0.70  CALCIUM 8.0* 7.9*   PT/INR No results for input(s): "LABPROT", "INR" in the last 72 hours. CMP     Component Value Date/Time   NA 140 02/07/2022 0638   NA 139 06/05/2017 1446   K 3.6 02/07/2022 0638   CL 107 02/07/2022 0638   CO2 27 02/07/2022 0638   GLUCOSE 114 (H) 02/07/2022 0638   BUN 18 02/07/2022 0638   BUN 28 (H) 06/05/2017 1446   CREATININE 0.70 02/07/2022 0638   CALCIUM 7.9 (L) 02/07/2022 0638   PROT 5.7 (L) 02/07/2022 0638   PROT 6.4 06/05/2017 1446   ALBUMIN 2.6 (L) 02/07/2022 0638   ALBUMIN 3.8 06/05/2017 1446   AST 24 02/07/2022 0638   ALT 85 (H) 02/07/2022 0638   ALKPHOS 112 02/07/2022 0638   BILITOT 2.2 (H) 02/07/2022 0638   BILITOT 0.4 06/05/2017 1446   GFRNONAA >60 02/07/2022 0638   GFRAA >60 11/20/2019 1517   Lipase     Component Value Date/Time   LIPASE 19  02/03/2022 1417       Studies/Results: DG ERCP  Result Date: 02/06/2022 CLINICAL DATA:  Biliary stones. EXAM: ERCP TECHNIQUE: Multiple spot images obtained with the fluoroscopic device and submitted for interpretation post-procedure. FLUOROSCOPY TIME: FLUOROSCOPY TIME 2 minutes, 52 seconds (43 mGy) COMPARISON:  CT abdomen and pelvis-02/03/2022 FINDINGS: Six spot fluoroscopic images the right upper abdominal quadrant during ERCP are provided for review. Initial image demonstrates an ERCP probe overlying the right upper abdominal quadrant. Subsequent images demonstrate selective cannulation and opacification of the common bile duct which appears mildly dilated. Subsequent images demonstrate insufflation of a balloon within distal aspect of the CBD with subsequent biliary sweeping and presumed sphincterotomy. There is minimal opacification of the intrahepatic biliary tree which appears moderately dilated. There is opacification of the cystic duct as well faint opacification of the gallbladder lumen. IMPRESSION: ERCP with biliary sweeping and presumed sphincterotomy as above. These images were submitted for radiologic interpretation only. Please see the procedural report for the amount of contrast and the fluoroscopy time utilized. Electronically Signed   By: Sandi Mariscal M.D.   On: 02/06/2022 08:34    Anti-infectives: Anti-infectives (From admission, onward)    Start     Dose/Rate Route Frequency Ordered Stop   02/04/22  0800  piperacillin-tazobactam (ZOSYN) IVPB 3.375 g        3.375 g 12.5 mL/hr over 240 Minutes Intravenous Every 8 hours 02/04/22 0227     02/03/22 2315  piperacillin-tazobactam (ZOSYN) IVPB 3.375 g        3.375 g 100 mL/hr over 30 Minutes Intravenous  Once 02/03/22 2313 02/04/22 0020        Assessment/Plan Choledocholithiasis - u/s 7/7 shows severe gallbladder distension to the level of right lower quadrant; hyper and hypoechoic material is present within the gallbladder  lumen, this appears to contain vascularity and raises concern for gallbladder mass lesion; Negative sonographic Percell Miller; severe intra and extrahepatic biliary dilatation - s/p ERCP 7/9 with stone and large amounts of sludge and debris  cleared after multiple sweeps, occlusion cholangiogram negative; cystic duct filled and gallbladder filled partially - WBC normalized and LFTs continue to trend down - Tolerating diet and nontender on abdominal exam today. Discussed plan again with patient's daughter. She understands that this could happen again without cholecystectomy but patient is higher risk with surgical intervention. We will continue conservative management for now. Recommend 3 more days of oral antibiotics to complete 1 week. [place] for discharge from surgical standpoint.   ID - zosyn 7/7>> FEN - soft diet  VTE - lovenox Foley - none   A fib on eliquis HTN Dementia Code status DNR   I reviewed Consultant gastroenterology notes, hospitalist notes, last 24 h vitals and pain scores, last 48 h intake and output, last 24 h labs and trends, and last 24 h imaging results    LOS: 4 days    Wellington Hampshire, [facility] Surgery 02/07/2022, 9:26 AM Please see Amion for pager number during day hours 7:00am-4:30pm

## 2022-02-08 DIAGNOSIS — K8050 Calculus of bile duct without cholangitis or cholecystitis without obstruction: Secondary | ICD-10-CM | POA: Diagnosis not present

## 2022-02-08 LAB — COMPREHENSIVE METABOLIC PANEL
ALT: 64 U/L — ABNORMAL HIGH (ref 0–44)
AST: 22 U/L (ref 15–41)
Albumin: 2.8 g/dL — ABNORMAL LOW (ref 3.5–5.0)
Alkaline Phosphatase: 100 U/L (ref 38–126)
Anion gap: 11 (ref 5–15)
BUN: 13 mg/dL (ref 8–23)
CO2: 26 mmol/L (ref 22–32)
Calcium: 8 mg/dL — ABNORMAL LOW (ref 8.9–10.3)
Chloride: 106 mmol/L (ref 98–111)
Creatinine, Ser: 0.81 mg/dL (ref 0.44–1.00)
GFR, Estimated: >60 mL/min (ref >60)
Glucose, Bld: 97 mg/dL (ref 70–99)
Potassium: 3.1 mmol/L — ABNORMAL LOW (ref 3.5–5.1)
Sodium: 143 mmol/L (ref 135–145)
Total Bilirubin: 1.9 mg/dL — ABNORMAL HIGH (ref 0.3–1.2)
Total Protein: 5.8 g/dL — ABNORMAL LOW (ref 6.5–8.1)

## 2022-02-08 LAB — CBC
HCT: 42 % (ref 36.0–46.0)
Hemoglobin: 13.8 g/dL (ref 12.0–15.0)
MCH: 30.9 pg (ref 26.0–34.0)
MCHC: 32.9 g/dL (ref 30.0–36.0)
MCV: 94.2 fL (ref 80.0–100.0)
Platelets: 151 10*3/uL (ref 150–400)
RBC: 4.46 MIL/uL (ref 3.87–5.11)
RDW: 15.1 % (ref 11.5–15.5)
WBC: 7.1 10*3/uL (ref 4.0–10.5)
nRBC: 0 % (ref 0.0–0.2)

## 2022-02-08 LAB — MAGNESIUM: Magnesium: 1.9 mg/dL (ref 1.7–2.4)

## 2022-02-08 MED ORDER — POTASSIUM CHLORIDE CRYS ER 20 MEQ PO TBCR
40.0000 meq | EXTENDED_RELEASE_TABLET | Freq: Once | ORAL | Status: AC
  Administered 2022-02-08: 40 meq via ORAL
  Filled 2022-02-08: qty 2

## 2022-02-08 MED ORDER — QUETIAPINE FUMARATE 25 MG PO TABS
25.0000 mg | ORAL_TABLET | Freq: Every day | ORAL | Status: DC
  Administered 2022-02-09 – 2022-02-10 (×2): 25 mg via ORAL
  Filled 2022-02-08 (×3): qty 1

## 2022-02-08 MED ORDER — MELATONIN 3 MG PO TABS
3.0000 mg | ORAL_TABLET | Freq: Every day | ORAL | Status: DC
  Administered 2022-02-09 – 2022-02-10 (×2): 3 mg via ORAL
  Filled 2022-02-08 (×3): qty 1

## 2022-02-08 NOTE — Progress Notes (Signed)
Spoke to patient's daughter this morning who is concerned regarding patient's increased confusion/delirium. Reassured daughter that the care team is doing their best to manage symptoms and encouraged daughter to continue supporting patient by being present and comforting patient as much as possible. RN explained that the doctor would be by to round this morning and might have more answers for her at that time.

## 2022-02-08 NOTE — H&P (View-Only) (Signed)
Patient ID: Stephanie Ritter, female   DOB: 1927-02-13, 88 y.o.   MRN: [id number] [facility] Surgery Progress Note  3 Days Post-Op  Subjective: CC-  Family at bedside. Patient had a rough day yesterday with worsening confusion. She had return in RUQ pain although is feeling a little better this morning. Tolerated breakfast without worsening symptoms. Reports RUQ abdominal pain as 2/10.   Objective: Vital signs in last 24 hours: Temp:  [97.4 F (36.3 C)-97.8 F (36.6 C)] 97.8 F (36.6 C) (07/12 0447) Pulse Rate:  [102-110] 105 (07/12 0447) Resp:  [16-18] 16 (07/12 0447) BP: (136-149)/(77-85) 136/83 (07/12 0447) SpO2:  [92 %-95 %] 94 % (07/12 0447) Last BM Date : 02/07/22  Intake/Output from previous day: 07/11 0701 - 07/12 0700 In: 115.8 [I.V.:33; IV Piggyback:82.8] Out: -  Intake/Output this shift: Total I/O In: 480 [P.O.:480] Out: -   PE: Gen:  Alert, NAD, pleasant Abd: soft, ND, mild focal RUQ TTP  Lab Results:  Recent Labs    02/07/22 0638 02/08/22 0748  WBC 6.8 7.1  HGB 13.0 13.8  HCT 39.7 42.0  PLT 120* 151   BMET Recent Labs    02/07/22 0638 02/08/22 0748  NA 140 143  K 3.6 3.1*  CL 107 106  CO2 27 26  GLUCOSE 114* 97  BUN 18 13  CREATININE 0.70 0.81  CALCIUM 7.9* 8.0*   PT/INR No results for input(s): "LABPROT", "INR" in the last 72 hours. CMP     Component Value Date/Time   NA 143 02/08/2022 0748   NA 139 06/05/2017 1446   K 3.1 (L) 02/08/2022 0748   CL 106 02/08/2022 0748   CO2 26 02/08/2022 0748   GLUCOSE 97 02/08/2022 0748   BUN 13 02/08/2022 0748   BUN 28 (H) 06/05/2017 1446   CREATININE 0.81 02/08/2022 0748   CALCIUM 8.0 (L) 02/08/2022 0748   PROT 5.8 (L) 02/08/2022 0748   PROT 6.4 06/05/2017 1446   ALBUMIN 2.8 (L) 02/08/2022 0748   ALBUMIN 3.8 06/05/2017 1446   AST 22 02/08/2022 0748   ALT 64 (H) 02/08/2022 0748   ALKPHOS 100 02/08/2022 0748   BILITOT 1.9 (H) 02/08/2022 0748   BILITOT 0.4 06/05/2017 1446    GFRNONAA >60 02/08/2022 0748   GFRAA >60 11/20/2019 1517   Lipase     Component Value Date/Time   LIPASE 17 02/07/2022 1348       Studies/Results: No results found.  Anti-infectives: Anti-infectives (From admission, onward)    Start     Dose/Rate Route Frequency Ordered Stop   02/04/22 0800  piperacillin-tazobactam (ZOSYN) IVPB 3.375 g        3.375 g 12.5 mL/hr over 240 Minutes Intravenous Every 8 hours 02/04/22 0227 02/11/22 0759   02/03/22 2315  piperacillin-tazobactam (ZOSYN) IVPB 3.375 g        3.375 g 100 mL/hr over 30 Minutes Intravenous  Once 02/03/22 2313 02/04/22 0020        Assessment/Plan Choledocholithiasis - u/s 7/7 shows severe gallbladder distension to the level of right lower quadrant; hyper and hypoechoic material is present within the gallbladder lumen, this appears to contain vascularity and raises concern for gallbladder mass lesion; Negative sonographic Percell Miller; severe intra and extrahepatic biliary dilatation - s/p ERCP 7/9 with stone and large amounts of sludge and debris  cleared after multiple sweeps, occlusion cholangiogram negative; cystic duct filled and gallbladder filled partially - WBC normalized and LFTs continue to trend down - Tolerating diet but still with some  RUQ tenderness on exam. I'm worried she will continue to be symptomatic if she goes home, but she is also high risk with surgical intervention. Discussed treatment options with daughter and she would like to think about everything. I will also discuss with MD then talk to family again later today.   ID - zosyn 7/7>> FEN - soft diet  VTE - lovenox Foley - none   A fib on eliquis HTN Dementia Code status DNR   I reviewed hospitalist notes, last 24 h vitals and pain scores, last 48 h intake and output, last 24 h labs and trends   LOS: 5 days    Wellington Hampshire, [facility] Surgery 02/08/2022, 11:58 AM Please see Amion for pager number during day hours  7:00am-4:30pm

## 2022-02-08 NOTE — Progress Notes (Signed)
PROGRESS NOTE  Stephanie Ritter  DOB: 21-Jul-1927  PCP: Stephanie Manes, MD RSW:[id number]  DOA: 02/03/2022  LOS: 5 days  Hospital Day: 6  Brief narrative: Stephanie Ritter is a 88 y.o. female with PMH significant for HTN, CHF, A-fib on Eliquis, tinnitus. Patient presented to the ED on 7/7 with complaint of abdominal pain, nausea, vomiting. Initial labs showed elevated LFTs Ultrasound showed severe gallbladder distention to the level of right lower quadrant, hyperand hypoechoic material present in the gallbladder lumen, negative Murphy sign, severe intra and extrahepatic biliary dilatation. Admitted to hospitalist service GI and general surgery consulted 7/9, patient underwent ERCP.  Per GI note, stone and large amounts of sludge and debris's were cleared after multiple sweeps, negative occlusion cholangiogram. See below for details  Subjective: Patient was seen and examined this morning.  Pleasant elderly female.  Sitting up in chair.  Cheerful demented.  Hard of hearing.  Daughter at bedside.  Not in distress.  Assessment and plan: Choledocholithiasis Elevated liver enzymes -Presented with abdominal pain, nausea, vomiting.  Underwent ERCP.  Findings as above. -Liver enzymes improving as below. Recent Labs  Lab 02/04/22 0532 02/05/22 0626 02/06/22 0626 02/07/22 0638 02/08/22 0748  AST 256* 98* 44* 24 22  ALT 329* 190* 130* 85* 64*  ALKPHOS 133* 134* 123 112 100  BILITOT 4.9* 3.0* 2.8* 2.2* 1.9*  PROT 6.2* 5.6* 5.6* 5.7* 5.8*  ALBUMIN 3.2* 2.8* 2.6* 2.6* 2.8*   Severe gallbladder distention Questionable gallbladder mass -Ultrasound 7/7 showed severe gallbladder distention to the level of right lower quadrant.  But negative sonographic Murphy sign. -Seen by general surgery.  Cholecystectomy was deferred for now but 7/11, patient started having abdominal pain.  Pending general surgery plan. -Currently on IV Zosyn for 7-day course.  Persistent A-fib -Continue  amiodarone, diltiazem.   -Eliquis on hold.  Last dose 7/7 at noon.  Hypokalemia -Potassium level low at 3.1 today.  Oral replacement given. Recent Labs  Lab 02/04/22 0532 02/05/22 0938 02/06/22 0626 02/07/22 0638 02/08/22 0748  K 3.6 3.1* 4.1 3.6 3.1*  MG  --  2.2  --   --  1.9   Hypertension -Continue Cardizem   Acute metabolic encephalopathy, underlying dementia -likely a degree of in-hospital delirium, fluctuating mental status but for most part she is pleasant, easily orientable, and anticipate improvement when she would get home. -Started on melatonin and Seroquel for tonight.  B12 deficiency -continue B12  Goals of care   Code Status: DNR    Mobility: Encourage ambulation.   Skin assessment:     Nutritional status:  Body mass index is 27.81 kg/m.          Diet:  Diet Order             Diet NPO time specified Except for: Sips with Meds  Diet effective midnight           DIET SOFT Room service appropriate? Yes; Fluid consistency: Thin  Diet effective now                   DVT prophylaxis:  enoxaparin (LOVENOX) injection 40 mg Start: 02/04/22 1000   Antimicrobials: IV Zosyn Fluid: NS at 100 mill per hour Consultants: General surgery Family Communication: Daughter at bedside  Status is: Patient  Continue in-hospital care because: Pending clearance for surgery Level of care: Telemetry   Dispo: The patient is from: Home              Anticipated d/c is  to: Home with therapy              Patient currently is not medically stable to d/c.   Difficult to place patient No     Infusions:   sodium chloride 100 mL/hr at 02/08/22 0300   piperacillin-tazobactam (ZOSYN)  IV 3.375 g (02/08/22 1625)    Scheduled Meds:  amiodarone  100 mg Oral Q M,W,F   cholecalciferol  2,000 Units Oral q morning   diltiazem  300 mg Oral Daily   enoxaparin (LOVENOX) injection  40 mg Subcutaneous Q24H   escitalopram  20 mg Oral Daily   melatonin  3 mg Oral QHS    mirabegron ER  25 mg Oral Daily   multivitamin  1 tablet Oral Daily   potassium chloride  40 mEq Oral Once   QUEtiapine  25 mg Oral QHS   vitamin B-12  1,000 mcg Oral Daily    PRN meds: acetaminophen **OR** acetaminophen, hydrOXYzine, ipratropium, magnesium hydroxide, morphine injection, ondansetron **OR** ondansetron (ZOFRAN) IV, tetrahydrozoline, traZODone   Antimicrobials: Anti-infectives (From admission, onward)    Start     Dose/Rate Route Frequency Ordered Stop   02/04/22 0800  piperacillin-tazobactam (ZOSYN) IVPB 3.375 g        3.375 g 12.5 mL/hr over 240 Minutes Intravenous Every 8 hours 02/04/22 0227 02/11/22 0759   02/03/22 2315  piperacillin-tazobactam (ZOSYN) IVPB 3.375 g        3.375 g 100 mL/hr over 30 Minutes Intravenous  Once 02/03/22 2313 02/04/22 0020       Objective: Vitals:   02/08/22 0447 02/08/22 1419  BP: 136/83 (!) 124/55  Pulse: (!) 105 75  Resp: 16 16  Temp: 97.8 F (36.6 C) (!) 97.4 F (36.3 C)  SpO2: 94% 92%    Intake/Output Summary (Last 24 hours) at 02/08/2022 1629 Last data filed at 02/08/2022 0850 Gross per 24 hour  Intake 512.98 ml  Output --  Net 512.98 ml   Filed Weights   02/04/22 1600 02/05/22 1300 02/06/22 0534  Weight: 71 kg 71 kg 73.5 kg   Weight change:  Body mass index is 27.81 kg/m.   Physical Exam: General exam: Pleasant, elderly female.  Not in physical distress Skin: No rashes, lesions or ulcers. HEENT: Atraumatic, normocephalic, no obvious bleeding Lungs: Clear to auscultation bilaterally CVS: Regular rate and rhythm, no murmur GI/Abd soft, nontender, nondistended, bowel sound present CNS: Alert, awake, oriented to place only.  Cheerfully demented Psychiatry: Mood appropriate Extremities: No pedal edema, no calf tenderness  Data Review: I have personally reviewed the laboratory data and studies available.  F/u labs ordered Unresulted Labs (From admission, onward)     Start     Ordered   02/09/22 0500   Lipase, blood  Tomorrow morning,   R        02/08/22 1129   02/09/22 0500  CBC with Differential/Platelet  Tomorrow morning,   R        02/08/22 1629   02/09/22 0500  Comprehensive metabolic panel  Tomorrow morning,   R        02/08/22 1629   02/03/22 1404  Urine Culture  Once,   URGENT       Question:  Indication  Answer:  Suprapubic pain   02/03/22 1404   02/03/22 1347  Urinalysis, Routine w reflex microscopic  Once,   URGENT        02/03/22 1347            Signed, Marlowe Aschoff Darriana Deboy,  MD [facility] 02/08/2022

## 2022-02-08 NOTE — Progress Notes (Signed)
Patient ID: Stephanie Ritter, female   DOB: 11/14/1926, 88 y.o.   MRN: [id number] [facility] Surgery Progress Note  3 Days Post-Op  Subjective: CC-  Family at bedside. Patient had a rough day yesterday with worsening confusion. She had return in RUQ pain although is feeling a little better this morning. Tolerated breakfast without worsening symptoms. Reports RUQ abdominal pain as 2/10.   Objective: Vital signs in last 24 hours: Temp:  [97.4 F (36.3 C)-97.8 F (36.6 C)] 97.8 F (36.6 C) (07/12 0447) Pulse Rate:  [102-110] 105 (07/12 0447) Resp:  [16-18] 16 (07/12 0447) BP: (136-149)/(77-85) 136/83 (07/12 0447) SpO2:  [92 %-95 %] 94 % (07/12 0447) Last BM Date : 02/07/22  Intake/Output from previous day: 07/11 0701 - 07/12 0700 In: 115.8 [I.V.:33; IV Piggyback:82.8] Out: -  Intake/Output this shift: Total I/O In: 480 [P.O.:480] Out: -   PE: Gen:  Alert, NAD, pleasant Abd: soft, ND, mild focal RUQ TTP  Lab Results:  Recent Labs    02/07/22 0638 02/08/22 0748  WBC 6.8 7.1  HGB 13.0 13.8  HCT 39.7 42.0  PLT 120* 151   BMET Recent Labs    02/07/22 0638 02/08/22 0748  NA 140 143  K 3.6 3.1*  CL 107 106  CO2 27 26  GLUCOSE 114* 97  BUN 18 13  CREATININE 0.70 0.81  CALCIUM 7.9* 8.0*   PT/INR No results for input(s): "LABPROT", "INR" in the last 72 hours. CMP     Component Value Date/Time   NA 143 02/08/2022 0748   NA 139 06/05/2017 1446   K 3.1 (L) 02/08/2022 0748   CL 106 02/08/2022 0748   CO2 26 02/08/2022 0748   GLUCOSE 97 02/08/2022 0748   BUN 13 02/08/2022 0748   BUN 28 (H) 06/05/2017 1446   CREATININE 0.81 02/08/2022 0748   CALCIUM 8.0 (L) 02/08/2022 0748   PROT 5.8 (L) 02/08/2022 0748   PROT 6.4 06/05/2017 1446   ALBUMIN 2.8 (L) 02/08/2022 0748   ALBUMIN 3.8 06/05/2017 1446   AST 22 02/08/2022 0748   ALT 64 (H) 02/08/2022 0748   ALKPHOS 100 02/08/2022 0748   BILITOT 1.9 (H) 02/08/2022 0748   BILITOT 0.4 06/05/2017 1446    GFRNONAA >60 02/08/2022 0748   GFRAA >60 11/20/2019 1517   Lipase     Component Value Date/Time   LIPASE 17 02/07/2022 1348       Studies/Results: No results found.  Anti-infectives: Anti-infectives (From admission, onward)    Start     Dose/Rate Route Frequency Ordered Stop   02/04/22 0800  piperacillin-tazobactam (ZOSYN) IVPB 3.375 g        3.375 g 12.5 mL/hr over 240 Minutes Intravenous Every 8 hours 02/04/22 0227 02/11/22 0759   02/03/22 2315  piperacillin-tazobactam (ZOSYN) IVPB 3.375 g        3.375 g 100 mL/hr over 30 Minutes Intravenous  Once 02/03/22 2313 02/04/22 0020        Assessment/Plan Choledocholithiasis - u/s 7/7 shows severe gallbladder distension to the level of right lower quadrant; hyper and hypoechoic material is present within the gallbladder lumen, this appears to contain vascularity and raises concern for gallbladder mass lesion; Negative sonographic Percell Miller; severe intra and extrahepatic biliary dilatation - s/p ERCP 7/9 with stone and large amounts of sludge and debris  cleared after multiple sweeps, occlusion cholangiogram negative; cystic duct filled and gallbladder filled partially - WBC normalized and LFTs continue to trend down - Tolerating diet but still with some  RUQ tenderness on exam. I'm worried she will continue to be symptomatic if she goes home, but she is also high risk with surgical intervention. Discussed treatment options with daughter and she would like to think about everything. I will also discuss with MD then talk to family again later today.   ID - zosyn 7/7>> FEN - soft diet  VTE - lovenox Foley - none   A fib on eliquis HTN Dementia Code status DNR   I reviewed hospitalist notes, last 24 h vitals and pain scores, last 48 h intake and output, last 24 h labs and trends   LOS: 5 days    Wellington Hampshire, [facility] Surgery 02/08/2022, 11:58 AM Please see Amion for pager number during day hours  7:00am-4:30pm

## 2022-02-09 ENCOUNTER — Encounter (HOSPITAL_COMMUNITY): Payer: Self-pay | Admitting: Family Medicine

## 2022-02-09 ENCOUNTER — Inpatient Hospital Stay (HOSPITAL_COMMUNITY): Payer: Medicare Other | Admitting: Certified Registered"

## 2022-02-09 ENCOUNTER — Other Ambulatory Visit: Payer: Self-pay

## 2022-02-09 ENCOUNTER — Encounter (HOSPITAL_COMMUNITY)
Admission: EM | Disposition: A | Discharge status: Home Health | Payer: Self-pay | Source: Home / Self Care | Attending: Internal Medicine

## 2022-02-09 DIAGNOSIS — Z87891 Personal history of nicotine dependence: Secondary | ICD-10-CM

## 2022-02-09 DIAGNOSIS — K8050 Calculus of bile duct without cholangitis or cholecystitis without obstruction: Secondary | ICD-10-CM | POA: Diagnosis not present

## 2022-02-09 DIAGNOSIS — I110 Hypertensive heart disease with heart failure: Secondary | ICD-10-CM

## 2022-02-09 DIAGNOSIS — I5041 Acute combined systolic (congestive) and diastolic (congestive) heart failure: Secondary | ICD-10-CM

## 2022-02-09 DIAGNOSIS — K8000 Calculus of gallbladder with acute cholecystitis without obstruction: Secondary | ICD-10-CM

## 2022-02-09 HISTORY — PX: CHOLECYSTECTOMY: SHX55

## 2022-02-09 LAB — COMPREHENSIVE METABOLIC PANEL
ALT: 56 U/L — ABNORMAL HIGH (ref 0–44)
AST: 25 U/L (ref 15–41)
Albumin: 2.8 g/dL — ABNORMAL LOW (ref 3.5–5.0)
Alkaline Phosphatase: 102 U/L (ref 38–126)
Anion gap: 7 (ref 5–15)
BUN: 15 mg/dL (ref 8–23)
CO2: 29 mmol/L (ref 22–32)
Calcium: 8.4 mg/dL — ABNORMAL LOW (ref 8.9–10.3)
Chloride: 111 mmol/L (ref 98–111)
Creatinine, Ser: 1 mg/dL (ref 0.44–1.00)
GFR, Estimated: 53 mL/min — ABNORMAL LOW (ref >60)
Glucose, Bld: 100 mg/dL — ABNORMAL HIGH (ref 70–99)
Potassium: 3.7 mmol/L (ref 3.5–5.1)
Sodium: 147 mmol/L — ABNORMAL HIGH (ref 135–145)
Total Bilirubin: 1.3 mg/dL — ABNORMAL HIGH (ref 0.3–1.2)
Total Protein: 6.4 g/dL — ABNORMAL LOW (ref 6.5–8.1)

## 2022-02-09 LAB — CBC WITH DIFFERENTIAL/PLATELET
Abs Immature Granulocytes: 0.49 10*3/uL — ABNORMAL HIGH (ref 0.00–0.07)
Basophils Absolute: 0.1 10*3/uL (ref 0.0–0.1)
Basophils Relative: 1 %
Eosinophils Absolute: 0.1 10*3/uL (ref 0.0–0.5)
Eosinophils Relative: 2 %
HCT: 45.5 % (ref 36.0–46.0)
Hemoglobin: 14.4 g/dL (ref 12.0–15.0)
Immature Granulocytes: 7 %
Lymphocytes Relative: 22 %
Lymphs Abs: 1.5 10*3/uL (ref 0.7–4.0)
MCH: 30.3 pg (ref 26.0–34.0)
MCHC: 31.6 g/dL (ref 30.0–36.0)
MCV: 95.8 fL (ref 80.0–100.0)
Monocytes Absolute: 0.9 10*3/uL (ref 0.1–1.0)
Monocytes Relative: 13 %
Neutro Abs: 3.7 10*3/uL (ref 1.7–7.7)
Neutrophils Relative %: 55 %
Platelets: 204 10*3/uL (ref 150–400)
RBC: 4.75 MIL/uL (ref 3.87–5.11)
RDW: 15.2 % (ref 11.5–15.5)
WBC: 6.7 10*3/uL (ref 4.0–10.5)
nRBC: 0 % (ref 0.0–0.2)

## 2022-02-09 LAB — LIPASE, BLOOD: Lipase: 17 U/L (ref 11–51)

## 2022-02-09 SURGERY — LAPAROSCOPIC CHOLECYSTECTOMY
Anesthesia: General

## 2022-02-09 MED ORDER — SUGAMMADEX SODIUM 200 MG/2ML IV SOLN
INTRAVENOUS | Status: DC | PRN
  Administered 2022-02-09: 200 mg via INTRAVENOUS

## 2022-02-09 MED ORDER — 0.9 % SODIUM CHLORIDE (POUR BTL) OPTIME
TOPICAL | Status: DC | PRN
  Administered 2022-02-09: 1000 mL

## 2022-02-09 MED ORDER — HEMOSTATIC AGENTS (NO CHARGE) OPTIME
TOPICAL | Status: DC | PRN
  Administered 2022-02-09: 1 via TOPICAL

## 2022-02-09 MED ORDER — FENTANYL CITRATE PF 50 MCG/ML IJ SOSY: 25.0000 ug | PREFILLED_SYRINGE | INTRAMUSCULAR | Status: DC | PRN

## 2022-02-09 MED ORDER — CHLORHEXIDINE GLUCONATE 0.12 % MT SOLN
15.0000 mL | Freq: Once | OROMUCOSAL | Status: AC
  Administered 2022-02-09: 15 mL via OROMUCOSAL

## 2022-02-09 MED ORDER — BUPIVACAINE-EPINEPHRINE 0.25% -1:200000 IJ SOLN
INTRAMUSCULAR | Status: DC | PRN
  Administered 2022-02-09: 10 mL

## 2022-02-09 MED ORDER — BUPIVACAINE-EPINEPHRINE (PF) 0.25% -1:200000 IJ SOLN
INTRAMUSCULAR | Status: AC
  Filled 2022-02-09: qty 30

## 2022-02-09 MED ORDER — ROCURONIUM BROMIDE 10 MG/ML (PF) SYRINGE
PREFILLED_SYRINGE | INTRAVENOUS | Status: DC | PRN
  Administered 2022-02-09: 40 mg via INTRAVENOUS

## 2022-02-09 MED ORDER — ETOMIDATE 2 MG/ML IV SOLN
INTRAVENOUS | Status: DC | PRN
  Administered 2022-02-09: 10 mg via INTRAVENOUS

## 2022-02-09 MED ORDER — FENTANYL CITRATE (PF) 100 MCG/2ML IJ SOLN
INTRAMUSCULAR | Status: AC
  Filled 2022-02-09: qty 2

## 2022-02-09 MED ORDER — SODIUM CHLORIDE 0.45 % IV SOLN: INTRAVENOUS | Status: DC

## 2022-02-09 MED ORDER — ENOXAPARIN SODIUM 40 MG/0.4ML IJ SOSY
40.0000 mg | PREFILLED_SYRINGE | INTRAMUSCULAR | Status: DC
  Administered 2022-02-10 – 2022-02-11 (×2): 40 mg via SUBCUTANEOUS
  Filled 2022-02-09 (×2): qty 0.4

## 2022-02-09 MED ORDER — DEXAMETHASONE SODIUM PHOSPHATE 10 MG/ML IJ SOLN
INTRAMUSCULAR | Status: DC | PRN
  Administered 2022-02-09: 10 mg via INTRAVENOUS

## 2022-02-09 MED ORDER — OXYCODONE HCL 5 MG PO TABS: 2.5000 mg | ORAL_TABLET | ORAL | Status: DC | PRN

## 2022-02-09 MED ORDER — ALBUMIN HUMAN 5 % IV SOLN: INTRAVENOUS | Status: DC | PRN

## 2022-02-09 MED ORDER — ENOXAPARIN SODIUM 40 MG/0.4ML IJ SOSY: 40.0000 mg | PREFILLED_SYRINGE | INTRAMUSCULAR | Status: DC

## 2022-02-09 MED ORDER — FENTANYL CITRATE (PF) 100 MCG/2ML IJ SOLN
INTRAMUSCULAR | Status: DC | PRN
  Administered 2022-02-09: 50 ug via INTRAVENOUS
  Administered 2022-02-09 (×2): 25 ug via INTRAVENOUS

## 2022-02-09 MED ORDER — LACTATED RINGERS IR SOLN
Status: DC | PRN
  Administered 2022-02-09: 1000 mL

## 2022-02-09 MED ORDER — PHENYLEPHRINE 80 MCG/ML (10ML) SYRINGE FOR IV PUSH (FOR BLOOD PRESSURE SUPPORT)
PREFILLED_SYRINGE | INTRAVENOUS | Status: DC | PRN
  Administered 2022-02-09: 120 ug via INTRAVENOUS

## 2022-02-09 MED ORDER — LACTATED RINGERS IV SOLN: INTRAVENOUS | Status: DC

## 2022-02-09 SURGICAL SUPPLY — 44 items
APPLICATOR ARISTA FLEXITIP XL (MISCELLANEOUS) ×1 IMPLANT
APPLIER CLIP ROT 10 11.4 M/L (STAPLE) ×2
BAG COUNTER SPONGE SURGICOUNT (BAG) IMPLANT
CABLE HIGH FREQUENCY MONO STRZ (ELECTRODE) ×1 IMPLANT
CLIP APPLIE ROT 10 11.4 M/L (STAPLE) ×1 IMPLANT
COVER MAYO STAND STRL (DRAPES) ×2 IMPLANT
DERMABOND ADVANCED (GAUZE/BANDAGES/DRESSINGS) ×1
DERMABOND ADVANCED .7 DNX12 (GAUZE/BANDAGES/DRESSINGS) IMPLANT
DRAPE C-ARM 42X120 X-RAY (DRAPES) IMPLANT
DRAPE UTILITY XL STRL (DRAPES) ×2 IMPLANT
DRAPE WARM FLUID 44X44 (DRAPES) ×2 IMPLANT
ELECT REM PT RETURN 15FT ADLT (MISCELLANEOUS) ×2 IMPLANT
GLOVE INDICATOR 8.0 STRL GRN (GLOVE) ×2 IMPLANT
GLOVE SS BIOGEL STRL SZ 7.5 (GLOVE) ×1 IMPLANT
GLOVE SUPERSENSE BIOGEL SZ 7.5 (GLOVE) ×1
GOWN STRL REUS W/ TWL XL LVL3 (GOWN DISPOSABLE) ×2 IMPLANT
GOWN STRL REUS W/TWL XL LVL3 (GOWN DISPOSABLE) ×2
HEMOSTAT ARISTA ABSORB 3G PWDR (HEMOSTASIS) ×1 IMPLANT
HEMOSTAT SURGICEL 4X8 (HEMOSTASIS) IMPLANT
IRRIG SUCT STRYKERFLOW 2 WTIP (MISCELLANEOUS) ×2
IRRIGATION SUCT STRKRFLW 2 WTP (MISCELLANEOUS) ×1 IMPLANT
KIT BASIN OR (CUSTOM PROCEDURE TRAY) ×2 IMPLANT
KIT TURNOVER KIT A (KITS) IMPLANT
PAD POSITIONING PINK XL (MISCELLANEOUS) IMPLANT
PENCIL SMOKE EVACUATOR (MISCELLANEOUS) IMPLANT
POUCH RETRIEVAL ECOSAC 10 (ENDOMECHANICALS) IMPLANT
POUCH RETRIEVAL ECOSAC 10MM (ENDOMECHANICALS) ×1
PROTECTOR NERVE ULNAR (MISCELLANEOUS) IMPLANT
SCISSORS LAP 5X35 DISP (ENDOMECHANICALS) ×2 IMPLANT
SET CHOLANGIOGRAPH MIX (MISCELLANEOUS) ×1 IMPLANT
SET TUBE SMOKE EVAC HIGH FLOW (TUBING) IMPLANT
SLEEVE Z-THREAD 5X100MM (TROCAR) ×2 IMPLANT
SPIKE FLUID TRANSFER (MISCELLANEOUS) ×1 IMPLANT
SUT MNCRL AB 4-0 PS2 18 (SUTURE) ×2 IMPLANT
SUT VICRYL 0 UR6 27IN ABS (SUTURE) ×1 IMPLANT
SYS BAG RETRIEVAL 10MM (BASKET) ×2
SYSTEM BAG RETRIEVAL 10MM (BASKET) ×1 IMPLANT
TAPE CLOTH 4X10 WHT NS (GAUZE/BANDAGES/DRESSINGS) IMPLANT
TOWEL OR 17X26 10 PK STRL BLUE (TOWEL DISPOSABLE) ×2 IMPLANT
TOWEL OR NON WOVEN STRL DISP B (DISPOSABLE) ×2 IMPLANT
TRAY LAPAROSCOPIC (CUSTOM PROCEDURE TRAY) ×2 IMPLANT
TROCAR 11X100 Z THREAD (TROCAR) IMPLANT
TROCAR BALLN 12MMX100 BLUNT (TROCAR) ×2 IMPLANT
TROCAR Z-THREAD OPTICAL 5X100M (TROCAR) ×2 IMPLANT

## 2022-02-09 NOTE — Interval H&P Note (Signed)
History and Physical Interval Note:  02/09/2022 10:30 AM  Stephanie Ritter  has presented today for surgery, with the diagnosis of cholelitiasis.  The various methods of treatment have been discussed with the patient and family. After consideration of risks, benefits and other options for treatment, the patient has consented to  Procedure(s): LAPAROSCOPIC CHOLECYSTECTOMY (N/A) as a surgical intervention.  The patient's history has been reviewed, patient examined, no change in status, stable for surgery.  I have reviewed the patient's chart and labs.  Questions were answered to the patient's satisfaction.     [place]

## 2022-02-09 NOTE — Transfer of Care (Signed)
Immediate Anesthesia Transfer of Care Note  Patient: Stephanie Ritter  Procedure(s) Performed: LAPAROSCOPIC CHOLECYSTECTOMY  Patient Location: PACU  Anesthesia Type:General  Level of Consciousness: alert , drowsy and patient cooperative  Airway & Oxygen Therapy: Patient connected to face mask oxygen  Post-op Assessment: Report given to RN and Post -op Vital signs reviewed and stable  Post vital signs: stable  Last Vitals:  Vitals Value Taken Time  BP    Temp    Pulse    Resp    SpO2      Last Pain:  Vitals:   02/09/22 1013  TempSrc:   PainSc: 0-No pain      Patients Stated Pain Goal: 0 (61/44/31 5400)  Complications: No notable events documented.

## 2022-02-09 NOTE — Op Note (Signed)
Laparoscopic Cholecystectomy Procedure Note  Indications: This patient presents with symptomatic gallbladder disease and will undergo laparoscopic cholecystectomy.  Pre-operative Diagnosis: Calculus of gallbladder with acute cholecystitis, without mention of obstruction  Post-operative Diagnosis: Same  Surgeon: Turner Daniels MD    Assistants: none   Anesthesia: General endotracheal anesthesia and Local anesthesia 0.25.% bupivacaine, with epinephrine  ASA Class: 3  Procedure Details  The patient was seen again in the Holding Room. The risks, benefits, complications, treatment options, and expected outcomes were discussed with the patient. The possibilities of reaction to medication, pulmonary aspiration, perforation of viscus, bleeding, recurrent infection, finding a normal gallbladder, the need for additional procedures, failure to diagnose a condition, the possible need to convert to an open procedure, and creating a complication requiring transfusion or operation were discussed with the patient. The patient and/or family concurred with the proposed plan, giving informed consent. The site of surgery properly noted/marked. The patient was taken to Operating Room, identified as Stephanie Ritter and the procedure verified as Laparoscopic Cholecystectomy with Intraoperative Cholangiograms. A Time Out was held and the above information confirmed.  Prior to the induction of general anesthesia, antibiotic prophylaxis was administered. General endotracheal anesthesia was then administered and tolerated well. After the induction, the abdomen was prepped in the usual sterile fashion. The patient was positioned in the supine position with the left arm comfortably tucked, along with some reverse Trendelenburg.  Local anesthetic agent was injected into the skin near the umbilicus and an incision made. The midline fascia was incised and the Hasson technique was used to introduce a 12 mm port under  direct vision. It was secured with a figure of eight Vicryl suture placed in the usual fashion. Pneumoperitoneum was then created with CO2 and tolerated well without any adverse changes in the patient's vital signs. Additional trocars were introduced under direct vision with an 11 mm trocar in the epigastrium and 2 5 mm trocars in the right upper quadrant. All skin incisions were infiltrated with a local anesthetic agent before making the incision and placing the trocars.   The gallbladder was identified, the fundus grasped and retracted cephalad. Adhesions were lysed bluntly and with the electrocautery where indicated, taking care not to injure any adjacent organs or viscus. The infundibulum was grasped and retracted laterally, exposing the peritoneum overlying the triangle of Calot. This was then divided and exposed in a blunt fashion. The cystic duct was clearly identified and bluntly dissected circumferentially. The junctions of the gallbladder, cystic duct and common bile duct were clearly identified prior to the division of any linear structure.    The critical view was obtained.  Circumferential view of the cystic duct gallbladder junction was achieved.  Cholangiogram was not performed due to small duct caliber size.  The common bile duct could be visualized due to a paucity of fat at the porta hepatis.  The cystic duct was then  ligated with surgical clips  on the patient side and  clipped on the gallbladder side and divided. The cystic artery was identified, dissected free, ligated with clips and divided as well. Posterior cystic artery clipped and divided.  The gallbladder was dissected from the liver bed in retrograde fashion with the electrocautery. The gallbladder was removed with the ECOS sac.. The liver bed was irrigated and inspected. Hemostasis was achieved with the electrocautery. Copious irrigation was utilized and was repeatedly aspirated until clear all particulate matter. Hemostasis  was achieved with no signs  Of bleeding or bile leakage.  Arista applied to gallbladder bed.  Pneumoperitoneum was completely reduced after viewing removal of the trocars under direct vision. The wound was thoroughly irrigated and the fascia was then closed with a figure of eight suture; the skin was then closed with 4 0 MONOCRYL  and a sterile dressing  of Dermabond was applied.  Instrument, sponge, and needle counts were correct at closure and at the conclusion of the case.   Findings: Cholecystitis with Cholelithiasis  Estimated Blood Loss: Minimal         Drains: NONE         Total IV Fluids: per OR record         Specimens: Gallbladder           Complications: None; patient tolerated the procedure well.         Disposition: PACU - hemodynamically stable.         Condition: stable

## 2022-02-09 NOTE — Anesthesia Postprocedure Evaluation (Signed)
Anesthesia Post Note  Patient: Stephanie Ritter  Procedure(s) Performed: [place]     Patient location during evaluation: PACU Anesthesia Type: General Level of consciousness: awake Pain management: pain level controlled Vital Signs Assessment: post-procedure vital signs reviewed and stable Respiratory status: spontaneous breathing Cardiovascular status: blood pressure returned to baseline Postop Assessment: no apparent nausea or vomiting Anesthetic complications: no   No notable events documented.  Last Vitals:  Vitals:   02/09/22 1345 02/09/22 1423  BP: 132/60 119/75  Pulse: 100 88  Resp: 15 (!) 21  Temp:  36.7 C  SpO2: 92%     Last Pain:  Vitals:   02/09/22 1848  TempSrc:   PainSc: Asleep                 Honesty Menta

## 2022-02-09 NOTE — Anesthesia Preprocedure Evaluation (Addendum)
Anesthesia Evaluation  Patient identified by MRN, date of birth, ID band Patient awake    Reviewed: Allergy & Precautions, NPO status , Patient's Chart, lab work & pertinent test results  Airway Mallampati: II  TM Distance: >3 FB     Dental   Pulmonary former smoker,    breath sounds clear to auscultation       Cardiovascular hypertension, +CHF   Rhythm:Regular Rate:Normal  History noted Dr. Nyoka Cowden   Neuro/Psych PSYCHIATRIC DISORDERS    GI/Hepatic negative GI ROS, (+) Hepatitis -  Endo/Other  negative endocrine ROS  Renal/GU negative Renal ROS     Musculoskeletal negative musculoskeletal ROS (+)   Abdominal   Peds  Hematology   Anesthesia Other Findings   Reproductive/Obstetrics                            Anesthesia Physical Anesthesia Plan  ASA: 3  Anesthesia Plan: General   Post-op Pain Management:    Induction: Intravenous  PONV Risk Score and Plan: 3 and Ondansetron, Dexamethasone and Treatment may vary due to age or medical condition  Airway Management Planned: Oral ETT  Additional Equipment:   Intra-op Plan:   Post-operative Plan: Extubation in OR  Informed Consent: I have reviewed the patients History and Physical, chart, labs and discussed the procedure including the risks, benefits and alternatives for the proposed anesthesia with the patient or authorized representative who has indicated his/her understanding and acceptance.    Suspend DNR.   Dental advisory given  Plan Discussed with: CRNA and Anesthesiologist  Anesthesia Plan Comments:        Anesthesia Quick Evaluation

## 2022-02-09 NOTE — Anesthesia Procedure Notes (Signed)
Procedure Name: Intubation Date/Time: 02/09/2022 11:28 AM  Performed by: Pilar Grammes, CRNAPre-anesthesia Checklist: Patient identified, Emergency Drugs available, Suction available, Patient being monitored and Timeout performed Patient Re-evaluated:Patient Re-evaluated prior to induction Oxygen Delivery Method: Circle system utilized Preoxygenation: Pre-oxygenation with 100% oxygen Induction Type: IV induction Ventilation: Mask ventilation without difficulty Laryngoscope Size: Miller and 2 Grade View: Grade II Tube type: Oral Tube size: 7.0 mm Number of attempts: 1 Airway Equipment and Method: Stylet Placement Confirmation: positive ETCO2, ETT inserted through vocal cords under direct vision, CO2 detector and breath sounds checked- equal and bilateral Secured at: 22 cm Tube secured with: Tape Dental Injury: Teeth and Oropharynx as per pre-operative assessment

## 2022-02-09 NOTE — Progress Notes (Signed)
PROGRESS NOTE  Stephanie Ritter  DOB: [birth year]  PCP: Lajean Manes, MD YTK:[id number]  DOA: 02/03/2022  LOS: 6 days  Hospital Day: 7  Brief narrative: Stephanie Ritter is a 88 y.o. female with PMH significant for HTN, CHF, A-fib on Eliquis, tinnitus. Patient presented to the ED on 7/7 with complaint of abdominal pain, nausea, vomiting. Initial labs showed elevated LFTs Ultrasound showed severe gallbladder distention to the level of right lower quadrant, hyperand hypoechoic material present in the gallbladder lumen, negative Murphy sign, severe intra and extrahepatic biliary dilatation. Admitted to hospitalist service GI and general surgery consulted 7/9, patient underwent ERCP.  Per GI note, stone and large amounts of sludge and debris's were cleared after multiple sweeps, negative occlusion cholangiogram. See below for details  Subjective: Patient was seen and examined this afternoon.  Underwent lap chole this morning.  Complain of mild abdominal soreness.  No other symptoms.  Daughter at bedside.  Assessment and plan+ Choledocholithiasis Elevated liver enzymes -Presented with abdominal pain, nausea, vomiting.  Underwent ERCP.  Findings as above. -Liver enzymes improving as below. Recent Labs  Lab 02/05/22 0626 02/06/22 0626 02/07/22 0638 02/08/22 0748 02/09/22 0611  AST 98* 44* '24 22 25  '$ ALT 190* 130* 85* 64* 56*  ALKPHOS 134* 123 112 100 102  BILITOT 3.0* 2.8* 2.2* 1.9* 1.3*  PROT 5.6* 5.6* 5.7* 5.8* 6.4*  ALBUMIN 2.8* 2.6* 2.6* 2.8* 2.8*    Severe gallbladder distention Questionable gallbladder mass -Ultrasound 7/7 showed severe gallbladder distention to the level of right lower quadrant.  But negative sonographic Murphy sign. -Seen by general surgery.  Cholecystectomy was deferred for now but 7/11, patient started having abdominal pain.   -Underwent lap chole today. -Currently on IV Zosyn for 7-day course.  Persistent A-fib -Continue amiodarone,  diltiazem.   -Eliquis on hold.  Last dose 7/7 at noon.  Will resume once cleared by general surgery.  Hypokalemia -Potassium level low at 3.1 today.  Oral replacement given. Recent Labs  Lab 02/05/22 0626 02/06/22 0626 02/07/22 5573 02/08/22 0748 02/09/22 0611  K 3.1* 4.1 3.6 3.1* 3.7  MG 2.2  --   --  1.9  --     Hypertension -Continue Cardizem   Acute metabolic encephalopathy, underlying dementia -likely a degree of in-hospital delirium, fluctuating mental status but for most part she is pleasant, easily orientable, and anticipate improvement when she would get home. -Started on melatonin and Seroquel yesterday.  B12 deficiency -continue B12  Goals of care   Code Status: DNR    Mobility: Encourage ambulation.   Skin assessment:     Nutritional status:  Body mass index is 27.78 kg/m.          Diet:  Diet Order             Diet clear liquid Room service appropriate? Yes; Fluid consistency: Thin  Diet effective now                   DVT prophylaxis:  enoxaparin (LOVENOX) injection 40 mg Start: 02/10/22 1000   Antimicrobials: IV Zosyn Fluid: NS at 50 mill per hour to continue Consultants: General surgery Family Communication: Daughter at bedside  Status is: Patient  Continue in-hospital care because: POD 0 Level of care: Telemetry   Dispo: The patient is from: Home              Anticipated d/c is to: Home with therapy in 1 to 2 days  Patient currently is not medically stable to d/c.   Difficult to place patient No     Infusions:   sodium chloride 50 mL/hr at 02/09/22 1429    Scheduled Meds:  amiodarone  100 mg Oral Q M,W,F   cholecalciferol  2,000 Units Oral q morning   diltiazem  300 mg Oral Daily   [START ON 02/10/2022] enoxaparin (LOVENOX) injection  40 mg Subcutaneous Q24H   escitalopram  20 mg Oral Daily   melatonin  3 mg Oral QHS   mirabegron ER  25 mg Oral Daily   multivitamin  1 tablet Oral Daily   QUEtiapine   25 mg Oral QHS   vitamin B-12  1,000 mcg Oral Daily    PRN meds: acetaminophen **OR** acetaminophen, hydrOXYzine, ipratropium, magnesium hydroxide, morphine injection, ondansetron **OR** ondansetron (ZOFRAN) IV, oxyCODONE, tetrahydrozoline, traZODone   Antimicrobials: Anti-infectives (From admission, onward)    Start     Dose/Rate Route Frequency Ordered Stop   02/04/22 0800  piperacillin-tazobactam (ZOSYN) IVPB 3.375 g  Status:  Discontinued        3.375 g 12.5 mL/hr over 240 Minutes Intravenous Every 8 hours 02/04/22 0227 02/09/22 1356   02/03/22 2315  piperacillin-tazobactam (ZOSYN) IVPB 3.375 g        3.375 g 100 mL/hr over 30 Minutes Intravenous  Once 02/03/22 2313 02/04/22 0020       Objective: Vitals:   02/09/22 1345 02/09/22 1423  BP: 132/60 119/75  Pulse: 100 88  Resp: 15 (!) 21  Temp:  98 F (36.7 C)  SpO2: 92%     Intake/Output Summary (Last 24 hours) at 02/09/2022 1627 Last data filed at 02/09/2022 1429 Gross per 24 hour  Intake 1310 ml  Output 25 ml  Net 1285 ml    Filed Weights   02/05/22 1300 02/06/22 0534 02/09/22 1027  Weight: 71 kg 73.5 kg 73.4 kg   Weight change:  Body mass index is 27.78 kg/m.   Physical Exam: General exam: Pleasant, elderly female.  Not in physical distress Skin: No rashes, lesions or ulcers. HEENT: Atraumatic, normocephalic, no obvious bleeding Lungs: Clear to auscultation bilaterally CVS: Regular rate and rhythm, no murmur GI/Abd soft, mild appropriate postop tenderness, nondistended, bowel sound present CNS: Alert, awake, oriented to place only.  Cheerfully demented Psychiatry: Mood appropriate Extremities: No pedal edema, no calf tenderness  Data Review: I have personally reviewed the laboratory data and studies available.  F/u labs ordered Unresulted Labs (From admission, onward)     Start     Ordered   02/03/22 1404  Urine Culture  Once,   URGENT       Question:  Indication  Answer:  Suprapubic pain    02/03/22 1404   02/03/22 1347  Urinalysis, Routine w reflex microscopic  Once,   URGENT        02/03/22 1347            Signed, Terrilee Croak, MD [facility] 02/09/2022

## 2022-02-09 NOTE — Progress Notes (Signed)
Patient ID: Stephanie Ritter, female   DOB: [birth year], 88 y.o.   MRN: [id number] [facility] Surgery Progress Note  4 Days Post-Op  Subjective: CC-  Daughter at bedside. Patient is tired this morning. Slept better last night. She has persistent RUQ pain on exam. WBC WNL, LFTs trending down.  Objective: Vital signs in last 24 hours: Temp:  [97.4 F (36.3 C)-97.8 F (36.6 C)] 97.5 F (36.4 C) (07/13 0555) Pulse Rate:  [56-75] 73 (07/13 0555) Resp:  [16-20] 16 (07/13 0555) BP: (124-161)/(55-95) 142/95 (07/13 0555) SpO2:  [92 %-95 %] 92 % (07/13 0555) Last BM Date : 02/07/22  Intake/Output from previous day: 07/12 0701 - 07/13 0700 In: 600 [P.O.:600] Out: -  Intake/Output this shift: No intake/output data recorded.  PE: Gen:  Sleep but wakes up and answers questions, NAD Abd: soft, ND, mild focal RUQ TTP  Lab Results:  Recent Labs    02/08/22 0748 02/09/22 0611  WBC 7.1 6.7  HGB 13.8 14.4  HCT 42.0 45.5  PLT 151 204   BMET Recent Labs    02/08/22 0748 02/09/22 0611  NA 143 147*  K 3.1* 3.7  CL 106 111  CO2 26 29  GLUCOSE 97 100*  BUN 13 15  CREATININE 0.81 1.00  CALCIUM 8.0* 8.4*   PT/INR No results for input(s): "LABPROT", "INR" in the last 72 hours. CMP     Component Value Date/Time   NA 147 (H) 02/09/2022 0611   NA 139 06/05/2017 1446   K 3.7 02/09/2022 0611   CL 111 02/09/2022 0611   CO2 29 02/09/2022 0611   GLUCOSE 100 (H) 02/09/2022 0611   BUN 15 02/09/2022 0611   BUN 28 (H) 06/05/2017 1446   CREATININE 1.00 02/09/2022 0611   CALCIUM 8.4 (L) 02/09/2022 0611   PROT 6.4 (L) 02/09/2022 0611   PROT 6.4 06/05/2017 1446   ALBUMIN 2.8 (L) 02/09/2022 0611   ALBUMIN 3.8 06/05/2017 1446   AST 25 02/09/2022 0611   ALT 56 (H) 02/09/2022 0611   ALKPHOS 102 02/09/2022 0611   BILITOT 1.3 (H) 02/09/2022 0611   BILITOT 0.4 06/05/2017 1446   GFRNONAA 53 (L) 02/09/2022 0611   GFRAA >60 11/20/2019 1517   Lipase     Component Value  Date/Time   LIPASE 17 02/09/2022 0611       Studies/Results: No results found.  Anti-infectives: Anti-infectives (From admission, onward)    Start     Dose/Rate Route Frequency Ordered Stop   02/04/22 0800  piperacillin-tazobactam (ZOSYN) IVPB 3.375 g        3.375 g 12.5 mL/hr over 240 Minutes Intravenous Every 8 hours 02/04/22 0227 02/11/22 0759   02/03/22 2315  piperacillin-tazobactam (ZOSYN) IVPB 3.375 g        3.375 g 100 mL/hr over 30 Minutes Intravenous  Once 02/03/22 2313 02/04/22 0020        Assessment/Plan Choledocholithiasis - u/s 7/7 shows severe gallbladder distension to the level of right lower quadrant; hyper and hypoechoic material is present within the gallbladder lumen, this appears to contain vascularity and raises concern for gallbladder mass lesion; Negative sonographic Percell Miller; severe intra and extrahepatic biliary dilatation - s/p ERCP 7/9 with stone and large amounts of sludge and debris  cleared after multiple sweeps, occlusion cholangiogram negative; cystic duct filled and gallbladder filled partially - WBC normalized and LFTs continue to trend down - Discussed at length options including operative vs nonoperative management. Patient's daughter understands that she is high risk with anesthesia/  surgery given her age and comorbidities. Given persistent RUQ tenderness on exam and risk of recurrence she would like to proceed with lap chole. Plan for surgery today. Keep NPO and continue IV antibiotics.   ID - zosyn 7/7>> FEN - NPO VTE - lovenox Foley - none   A fib on eliquis HTN Dementia Code status DNR   I reviewed hospitalist notes, last 24 h vitals and pain scores, last 48 h intake and output, last 24 h labs and trends    LOS: 6 days    Wellington Hampshire, [facility] 02/09/2022, 8:35 AM Please see Amion for pager number during day hours 7:00am-4:30pm

## 2022-02-09 NOTE — Progress Notes (Signed)
PT Cancellation Note  Patient Details Name: Stephanie Ritter MRN: [id number] DOB: July 19, 1927   Cancelled Treatment:     Pt off floor undergoing Laparoscopic Cholecystectomy will continue PT next available date/time per tolerance.   Josie Dixon 02/09/2022, 12:49 PM

## 2022-02-10 ENCOUNTER — Encounter (HOSPITAL_COMMUNITY): Payer: Self-pay | Admitting: Surgery

## 2022-02-10 DIAGNOSIS — K8050 Calculus of bile duct without cholangitis or cholecystitis without obstruction: Secondary | ICD-10-CM | POA: Diagnosis not present

## 2022-02-10 LAB — COMPREHENSIVE METABOLIC PANEL
ALT: 58 U/L — ABNORMAL HIGH (ref 0–44)
AST: 42 U/L — ABNORMAL HIGH (ref 15–41)
Albumin: 2.7 g/dL — ABNORMAL LOW (ref 3.5–5.0)
Alkaline Phosphatase: 81 U/L (ref 38–126)
Anion gap: 8 (ref 5–15)
BUN: 20 mg/dL (ref 8–23)
CO2: 25 mmol/L (ref 22–32)
Calcium: 8.1 mg/dL — ABNORMAL LOW (ref 8.9–10.3)
Chloride: 106 mmol/L (ref 98–111)
Creatinine, Ser: 0.79 mg/dL (ref 0.44–1.00)
GFR, Estimated: >60 mL/min (ref >60)
Glucose, Bld: 134 mg/dL — ABNORMAL HIGH (ref 70–99)
Potassium: 4.4 mmol/L (ref 3.5–5.1)
Sodium: 139 mmol/L (ref 135–145)
Total Bilirubin: 1.3 mg/dL — ABNORMAL HIGH (ref 0.3–1.2)
Total Protein: 5.8 g/dL — ABNORMAL LOW (ref 6.5–8.1)

## 2022-02-10 LAB — CBC
HCT: 39.3 % (ref 36.0–46.0)
Hemoglobin: 12.7 g/dL (ref 12.0–15.0)
MCH: 31.1 pg (ref 26.0–34.0)
MCHC: 32.3 g/dL (ref 30.0–36.0)
MCV: 96.1 fL (ref 80.0–100.0)
Platelets: 206 10*3/uL (ref 150–400)
RBC: 4.09 MIL/uL (ref 3.87–5.11)
RDW: 15.3 % (ref 11.5–15.5)
WBC: 11.8 10*3/uL — ABNORMAL HIGH (ref 4.0–10.5)
nRBC: 0 % (ref 0.0–0.2)

## 2022-02-10 LAB — SURGICAL PATHOLOGY

## 2022-02-10 MED ORDER — MORPHINE SULFATE (PF) 2 MG/ML IV SOLN: 2.0000 mg | INTRAVENOUS | Status: DC | PRN

## 2022-02-10 MED ORDER — POLYETHYLENE GLYCOL 3350 17 G PO PACK
17.0000 g | PACK | Freq: Every day | ORAL | Status: DC | PRN
Start: 2022-02-10 — End: 2022-02-11

## 2022-02-10 MED ORDER — DOCUSATE SODIUM 100 MG PO CAPS
100.0000 mg | ORAL_CAPSULE | Freq: Two times a day (BID) | ORAL | Status: DC
  Administered 2022-02-10 – 2022-02-11 (×3): 100 mg via ORAL
  Filled 2022-02-10 (×3): qty 1

## 2022-02-10 NOTE — TOC Initial Note (Signed)
Transition of Care [facility]) - Initial/Assessment Note    Patient Details  Name: Stephanie Ritter MRN: [id number] Date of Birth: 1926/08/24  Transition of Care [facility]) CM/SW Contact:    Lynnell Catalan, RN Phone Number: 02/10/2022, 2:39 PM  Clinical Narrative:                 Spoke with daughter for dc planning. Choice offered for home health services. Pt has used [facility] before and would like to use them again. [place] liaison called for referral.   Expected Discharge Plan: [place] Barriers to Discharge: Continued Medical Work up   Patient Goals and CMS Choice Patient states their goals for this hospitalization and ongoing recovery are:: home CMS Medicare.gov Compare Post Acute Care list provided to:: Patient Represenative (must comment) (Daughter) Choice offered to / list presented to : Adult Children  Expected Discharge Plan and Services Expected Discharge Plan: [place]   Discharge Planning Services: CM Consult Post Acute Care Choice: [place] arrangements for the past 2 months: Single Family Home                           HH Arranged: PT, OT HH Agency: [place] Date [place]: 02/10/22 Time HH Agency Contacted: 4 Representative spoke with at [place]: Claiborne Billings  Prior Living Arrangements/Services Living arrangements for the past 2 months: [place] with:: Adult Children Patient language and need for interpreter reviewed:: Yes Do you feel safe going back to the place where you live?: Yes      Need for Family Participation in Patient Care: Yes (Comment) Care giver support system in place?: Yes (comment)   Criminal Activity/Legal Involvement Pertinent to Current Situation/Hospitalization: No - Comment as needed  Activities of Daily Living Home Assistive Devices/Equipment: Cane (specify quad or straight) ADL Screening (condition at time of admission) Patient's cognitive  ability adequate to safely complete daily activities?: No Is the patient deaf or have difficulty hearing?: Yes Does the patient have difficulty seeing, even when wearing glasses/contacts?: No Does the patient have difficulty concentrating, remembering, or making decisions?: Yes Patient able to express need for assistance with ADLs?: No Does the patient have difficulty dressing or bathing?: Yes Independently performs ADLs?: No Communication: Independent Dressing (OT): Needs assistance Is this a change from baseline?: Pre-admission baseline Grooming: Independent Feeding: Independent Bathing: Needs assistance Is this a change from baseline?: Pre-admission baseline Toileting: Needs assistance Is this a change from baseline?: Pre-admission baseline In/Out Bed: Independent with device (comment) Walks in Home: Independent with device (comment) Does the patient have difficulty walking or climbing stairs?: Yes Weakness of Legs: Both Weakness of Arms/Hands: Both  Permission Sought/Granted Permission sought to share information with : Facility Art therapist granted to share information with : Yes, Verbal Permission Granted     Permission granted to share info w AGENCY: [place]        Emotional Assessment         Alcohol / Substance Use: Not Applicable    Admission diagnosis:  Choledocholithiasis [K80.50] Cholelithiasis with choledocholithiasis [K80.70] Patient Active Problem List   Diagnosis Date Noted   Acute hepatitis 02/04/2022   Gallbladder mass 02/04/2022   Depression 02/04/2022   Vitamin B12 deficiency 02/04/2022   Choledocholithiasis 02/03/2022   Cholelithiasis with cholecystitis 02/03/2022   Anticoagulated 02/03/2022   Femoral hernia 08/07/2018   Persistent atrial fibrillation ([place]) 01/26/2017   Essential hypertension  01/26/2017   Acute combined systolic and diastolic heart failure ([place]) 01/26/2017   PCP:  Lajean Manes, MD Pharmacy:    [facility] ([place], [place] [facility] [place] [address] [address] Phone: [phone] Fax: [phone]  [facility] [place], [place] [address] Phone: [phone] Fax: [phone]  [facility] ([facility] ) - [place], [place] [address] Phone: [phone] Fax: [place] [place] [address] Phone: [phone] Fax: [phone]     Social Determinants of Health (SDOH) Interventions    Readmission Risk Interventions    02/10/2022    2:27 PM  Readmission Risk Prevention Plan  Transportation Screening Complete  PCP or Specialist Appt within 5-7 Days Complete  Home Care Screening Complete

## 2022-02-10 NOTE — Progress Notes (Signed)
PROGRESS NOTE  Stephanie Ritter  DOB: November 16, 1926  PCP: Lajean Manes, MD HKV:[id number]  DOA: 02/03/2022  LOS: 7 days  Hospital Day: 8  Brief narrative: Stephanie Ritter is a 88 y.o. female with PMH significant for HTN, CHF, A-fib on Eliquis, tinnitus. Patient presented to the ED on 7/7 with complaint of abdominal pain, nausea, vomiting. Initial labs showed elevated LFTs Ultrasound showed severe gallbladder distention to the level of right lower quadrant, hyperand hypoechoic material present in the gallbladder lumen, negative Murphy sign, severe intra and extrahepatic biliary dilatation. Admitted to hospitalist service GI and general surgery consulted 7/9, patient underwent ERCP.  Per GI note, stone and large amounts of sludge and debris's were cleared after multiple sweeps, negative occlusion cholangiogram. See below for details  Subjective: Patient was seen and examined this morning.   Pleasant, propped up in bed.  On low-flow oxygen.  Slept well last night.  Pain controlled adequately. Went to the bathroom this morning.  Patient unable to tell if she had bowel movement or not.  Bowel sound present.  Assessment and plan+ Choledocholithiasis Elevated liver enzymes -Presented with abdominal pain, nausea, vomiting.  Underwent ERCP. Per GI note, stone and large amounts of sludge and debris's were cleared after multiple sweeps, negative occlusion cholangiogram. -Liver enzymes improving as below. Recent Labs  Lab 02/06/22 0626 02/07/22 5643 02/08/22 0748 02/09/22 0611 02/10/22 0729  AST 44* '24 22 25 '$ 42*  ALT 130* 85* 64* 56* 58*  ALKPHOS 123 112 100 102 81  BILITOT 2.8* 2.2* 1.9* 1.3* 1.3*  PROT 5.6* 5.7* 5.8* 6.4* 5.8*  ALBUMIN 2.6* 2.6* 2.8* 2.8* 2.7*    Severe gallbladder distention Questionable gallbladder mass -Ultrasound 7/7 showed severe gallbladder distention to the level of right lower quadrant.  But negative sonographic Murphy sign. -Seen by general  surgery.  Cholecystectomy was deferred for now but 7/11, patient started having abdominal pain.   -Underwent lap chole on 7/13. -To complete 7-day course of IV Zosyn.    Persistent A-fib -Continue amiodarone, diltiazem.   -Eliquis on hold.  Last dose 7/7 at noon.  Will resume once cleared by general surgery.  Hypokalemia -Potassium level low at 3.1 today.  Oral replacement given. Recent Labs  Lab 02/05/22 0626 02/06/22 0626 02/07/22 3295 02/08/22 0748 02/09/22 0611 02/10/22 0729  K 3.1* 4.1 3.6 3.1* 3.7 4.4  MG 2.2  --   --  1.9  --   --     Hypertension -Continue Cardizem   Acute metabolic encephalopathy, underlying dementia -likely a degree of in-hospital delirium, fluctuating mental status but for most part she is pleasant, easily orientable, and anticipate improvement when she would get home. -Started on melatonin and Seroquel yesterday.  B12 deficiency -continue B12  Goals of care   Code Status: DNR    Mobility: Encourage ambulation.   Skin assessment:     Nutritional status:  Body mass index is 27.78 kg/m.          Diet:  Diet Order             Diet regular Room service appropriate? Yes; Fluid consistency: Thin  Diet effective now                   DVT prophylaxis:  enoxaparin (LOVENOX) injection 40 mg Start: 02/10/22 1000   Antimicrobials: IV Zosyn Fluid: Switch IV fluid to half NS at 50 mill per hour to continue Consultants: General surgery Family Communication: Daughter at bedside  Status is: Patient  Continue in-hospital care because: POD 1 Level of care: Telemetry   Dispo: The patient is from: Home              Anticipated d/c is to: Home with therapy hopefully tomorrow              Patient currently is not medically stable to d/c.   Difficult to place patient No     Infusions:   sodium chloride 50 mL/hr at 02/10/22 0930    Scheduled Meds:  amiodarone  100 mg Oral Q M,W,F   cholecalciferol  2,000 Units Oral q morning    diltiazem  300 mg Oral Daily   enoxaparin (LOVENOX) injection  40 mg Subcutaneous Q24H   escitalopram  20 mg Oral Daily   melatonin  3 mg Oral QHS   mirabegron ER  25 mg Oral Daily   multivitamin  1 tablet Oral Daily   QUEtiapine  25 mg Oral QHS   vitamin B-12  1,000 mcg Oral Daily    PRN meds: acetaminophen **OR** acetaminophen, hydrOXYzine, ipratropium, magnesium hydroxide, morphine injection, ondansetron **OR** ondansetron (ZOFRAN) IV, oxyCODONE, tetrahydrozoline, traZODone   Antimicrobials: Anti-infectives (From admission, onward)    Start     Dose/Rate Route Frequency Ordered Stop   02/04/22 0800  piperacillin-tazobactam (ZOSYN) IVPB 3.375 g  Status:  Discontinued        3.375 g 12.5 mL/hr over 240 Minutes Intravenous Every 8 hours 02/04/22 0227 02/09/22 1356   02/03/22 2315  piperacillin-tazobactam (ZOSYN) IVPB 3.375 g        3.375 g 100 mL/hr over 30 Minutes Intravenous  Once 02/03/22 2313 02/04/22 0020       Objective: Vitals:   02/10/22 0510 02/10/22 0843  BP: 121/71   Pulse: (!) 106 68  Resp: 18   Temp: 98 F (36.7 C)   SpO2: 93% 92%    Intake/Output Summary (Last 24 hours) at 02/10/2022 1031 Last data filed at 02/10/2022 0742 Gross per 24 hour  Intake 2162.64 ml  Output 25 ml  Net 2137.64 ml    Filed Weights   02/05/22 1300 02/06/22 0534 02/09/22 1027  Weight: 71 kg 73.5 kg 73.4 kg   Weight change:  Body mass index is 27.78 kg/m.   Physical Exam: General exam: Pleasant, elderly female.  Not in physical distress Skin: No rashes, lesions or ulcers. HEENT: Atraumatic, normocephalic, no obvious bleeding Lungs: Clear to auscultation bilaterally CVS: Regular rate and rhythm, no murmur GI/Abd soft, mild appropriate postop tenderness, nondistended, bowel sound present CNS: Alert, awake, oriented to place only.  Cheerfully demented Psychiatry: Mood appropriate Extremities: No pedal edema, no calf tenderness  Data Review: I have personally reviewed  the laboratory data and studies available.  F/u labs ordered Unresulted Labs (From admission, onward)    None       Signed, Terrilee Croak, MD [facility] 02/10/2022

## 2022-02-10 NOTE — Progress Notes (Signed)
Patient ID: Stephanie Ritter, female   DOB: 09-01-1926, 88 y.o.   MRN: [id number] [facility] Surgery Progress Note  1 Day Post-Op  Subjective: CC-  Daughter at bedside. Patient slept well last time. Pain well controlled. Ambulated to restroom. Tolerating clear liquids. No n/v.  Objective: Vital signs in last 24 hours: Temp:  [97 F (36.1 C)-98 F (36.7 C)] 98 F (36.7 C) (07/14 0510) Pulse Rate:  [68-112] 68 (07/14 0843) Resp:  [14-21] 18 (07/14 0510) BP: (118-132)/(55-83) 121/71 (07/14 0510) SpO2:  [92 %-97 %] 92 % (07/14 0843) Last BM Date : 02/07/22  Intake/Output from previous day: 07/13 0701 - 07/14 0700 In: 2077.7 [P.O.:60; I.V.:1767.7; IV Piggyback:250] Out: 25 [Blood:25] Intake/Output this shift: Total I/O In: 85 [I.V.:85] Out: -   PE: Gen:  Alert, NAD, pleasant Abd: soft, appropriately tender, mild distension, +BS, lap incisions cdi  Lab Results:  Recent Labs    02/09/22 0611 02/10/22 0729  WBC 6.7 11.8*  HGB 14.4 12.7  HCT 45.5 39.3  PLT 204 206   BMET Recent Labs    02/09/22 0611 02/10/22 0729  NA 147* 139  K 3.7 4.4  CL 111 106  CO2 29 25  GLUCOSE 100* 134*  BUN 15 20  CREATININE 1.00 0.79  CALCIUM 8.4* 8.1*   PT/INR No results for input(s): "LABPROT", "INR" in the last 72 hours. CMP     Component Value Date/Time   NA 139 02/10/2022 0729   NA 139 06/05/2017 1446   K 4.4 02/10/2022 0729   CL 106 02/10/2022 0729   CO2 25 02/10/2022 0729   GLUCOSE 134 (H) 02/10/2022 0729   BUN 20 02/10/2022 0729   BUN 28 (H) 06/05/2017 1446   CREATININE 0.79 02/10/2022 0729   CALCIUM 8.1 (L) 02/10/2022 0729   PROT 5.8 (L) 02/10/2022 0729   PROT 6.4 06/05/2017 1446   ALBUMIN 2.7 (L) 02/10/2022 0729   ALBUMIN 3.8 06/05/2017 1446   AST 42 (H) 02/10/2022 0729   ALT 58 (H) 02/10/2022 0729   ALKPHOS 81 02/10/2022 0729   BILITOT 1.3 (H) 02/10/2022 0729   BILITOT 0.4 06/05/2017 1446   GFRNONAA >60 02/10/2022 0729   GFRAA >60 11/20/2019  1517   Lipase     Component Value Date/Time   LIPASE 17 02/09/2022 0611       Studies/Results: No results found.  Anti-infectives: Anti-infectives (From admission, onward)    Start     Dose/Rate Route Frequency Ordered Stop   02/04/22 0800  piperacillin-tazobactam (ZOSYN) IVPB 3.375 g  Status:  Discontinued        3.375 g 12.5 mL/hr over 240 Minutes Intravenous Every 8 hours 02/04/22 0227 02/09/22 1356   02/03/22 2315  piperacillin-tazobactam (ZOSYN) IVPB 3.375 g        3.375 g 100 mL/hr over 30 Minutes Intravenous  Once 02/03/22 2313 02/04/22 0020        Assessment/Plan Choledocholithiasis POD#1 s/p laparoscopic cholecystectomy 7/13 Dr. Brantley Stage - Doing well from surgery. Advance diet. Mobilize, PT to see today. Ok to resume eliquis tomorrow if h/h stable.   ID - zosyn 7/7>>7/13 FEN - reg diet, IVF VTE - lovenox Foley - none   A fib on eliquis HTN Dementia Code status DNR    LOS: 7 days    [place]ellington [place], [facility] Surgery 02/10/2022, 11:26 AM Please see Amion for pager number during day hours 7:00am-4:30pm

## 2022-02-10 NOTE — Care Management Important Message (Signed)
Important Message  Patient Details  Name: Stephanie Ritter MRN: [id number] Date of Birth: 1926-11-28   Medicare Important Message Given:  Yes     Memory Argue 02/10/2022, 2:24 PM

## 2022-02-10 NOTE — Progress Notes (Signed)
Physical Therapy Treatment Patient Details Name: Stephanie Ritter MRN: [id number] DOB: [birth year] Today's Date: 02/10/2022   History of Present Illness Stephanie Ritter is a 88 y.o. presents with abdominal pain, nausea and vomiting. Pt admitted with Choledocholithiasis, s/p ERCP 02/05/22.  Pt s/p laparoscopic cholecystectomy on 7/13.  PMH: CHF, hypertension and atrial fibrillation    PT Comments    Pt pleasant and agreeable to ambulate.  Pt requested use of RW instead of cane for ambulating.  Pt required a little assist to rise and steady however min/guard for ambulating.  Pt plans to eventually d/c home.    Recommendations for follow up therapy are one component of a multi-disciplinary discharge planning process, led by the attending physician.  Recommendations may be updated based on patient status, additional functional criteria and insurance authorization.  Follow Up Recommendations  Home health PT     Assistance Recommended at Discharge Frequent or constant Supervision/Assistance  Patient can return home with the following A little help with walking and/or transfers;A little help with bathing/dressing/bathroom;Assistance with cooking/housework;Assist for transportation;Help with stairs or ramp for entrance   Equipment Recommendations  None recommended by PT    Recommendations for Other Services       Precautions / Restrictions Precautions Precautions: Fall     Mobility  Bed Mobility               General bed mobility comments: pt in recliner    Transfers Overall transfer level: Needs assistance Equipment used: Rolling walker (2 wheels) Transfers: Sit to/from Stand Sit to Stand: Min assist           General transfer comment: assist to rise and transition to RW, cues for technique    Ambulation/Gait Ambulation/Gait assistance: Min guard Gait Distance (Feet): 160 Feet Assistive device: Rolling walker (2 wheels) Gait Pattern/deviations:  Step-through pattern, Decreased stride length, Trunk flexed       General Gait Details: cues for RW positioning and posture, fatigues quickly but able to ambulate back to room   Stairs             Wheelchair Mobility    Modified Rankin (Stroke Patients Only)       Balance Overall balance assessment: Needs assistance         Standing balance support: During functional activity, Bilateral upper extremity supported, Reliant on assistive device for balance Standing balance-Leahy Scale: Poor                              Cognition Arousal/Alertness: Awake/alert Behavior During Therapy: WFL for tasks assessed/performed Overall Cognitive Status: Within Functional Limits for tasks assessed                                 General Comments: pt pleasant, follows commands, minimally conversational        Exercises      General Comments        Pertinent Vitals/Pain Pain Assessment Pain Assessment: No/denies pain    Home Living                          Prior Function            PT Goals (current goals can now be found in the care plan section) Progress towards PT goals: Progressing toward goals    Frequency    Min  3X/week      PT Plan Current plan remains appropriate    Co-evaluation              AM-PAC PT "6 Clicks" Mobility   Outcome Measure  Help needed turning from your back to your side while in a flat bed without using bedrails?: A Little Help needed moving from lying on your back to sitting on the side of a flat bed without using bedrails?: A Little Help needed moving to and from a bed to a chair (including a wheelchair)?: A Little Help needed standing up from a chair using your arms (e.g., wheelchair or bedside chair)?: A Little Help needed to walk in hospital room?: A Little Help needed climbing 3-5 steps with a railing? : A Lot 6 Click Score: 17    End of Session Equipment Utilized During  Treatment: Gait belt Activity Tolerance: Patient tolerated treatment well;Patient limited by fatigue Patient left: with call bell/phone within reach;in chair   PT Visit Diagnosis: Unsteadiness on feet (R26.81);Other abnormalities of gait and mobility (R26.89)     Time: 2820-8138 PT Time Calculation (min) (ACUTE ONLY): 15 min  Charges:  $Gait Training: 8-22 mins                    Jannette Spanner PT, DPT Physical Therapist Acute Rehabilitation Services Preferred contact method: Secure Chat Weekend Pager Only: [phone] Office: [place] 02/10/2022, 3:39 PM

## 2022-02-11 ENCOUNTER — Other Ambulatory Visit (HOSPITAL_COMMUNITY): Payer: Self-pay

## 2022-02-11 DIAGNOSIS — K8050 Calculus of bile duct without cholangitis or cholecystitis without obstruction: Secondary | ICD-10-CM | POA: Diagnosis not present

## 2022-02-11 LAB — CBC
HCT: 35.3 % — ABNORMAL LOW (ref 36.0–46.0)
Hemoglobin: 11.4 g/dL — ABNORMAL LOW (ref 12.0–15.0)
MCH: 31.1 pg (ref 26.0–34.0)
MCHC: 32.3 g/dL (ref 30.0–36.0)
MCV: 96.2 fL (ref 80.0–100.0)
Platelets: 231 10*3/uL (ref 150–400)
RBC: 3.67 MIL/uL — ABNORMAL LOW (ref 3.87–5.11)
RDW: 15.3 % (ref 11.5–15.5)
WBC: 10.9 10*3/uL — ABNORMAL HIGH (ref 4.0–10.5)
nRBC: 0 % (ref 0.0–0.2)

## 2022-02-11 MED ORDER — SENNOSIDES-DOCUSATE SODIUM 8.6-50 MG PO TABS
1.0000 | ORAL_TABLET | Freq: Every day | ORAL | 0 refills | Status: AC
  Filled 2022-02-11: qty 14, 14d supply, fill #0

## 2022-02-11 MED ORDER — MELATONIN 3 MG PO TABS
3.0000 mg | ORAL_TABLET | Freq: Every day | ORAL | 0 refills | Status: AC
  Filled 2022-02-11: qty 30, 30d supply, fill #0

## 2022-02-11 MED ORDER — QUETIAPINE FUMARATE 25 MG PO TABS
25.0000 mg | ORAL_TABLET | Freq: Every day | ORAL | 0 refills | Status: DC
  Filled 2022-02-11: qty 30, 30d supply, fill #0

## 2022-02-11 MED ORDER — POLYETHYLENE GLYCOL 3350 17 G PO PACK
17.0000 g | PACK | Freq: Every day | ORAL | 0 refills | Status: AC | PRN
  Filled 2022-02-11: qty 14, 14d supply, fill #0

## 2022-02-11 MED ORDER — HYDROCODONE-ACETAMINOPHEN 5-325 MG PO TABS
1.0000 | ORAL_TABLET | Freq: Four times a day (QID) | ORAL | 0 refills | Status: AC | PRN
  Filled 2022-02-11: qty 20, 5d supply, fill #0

## 2022-02-11 NOTE — Discharge Summary (Addendum)
Physician Discharge Summary  Darolyn Double Danziger IWL:[id number] DOB: 26-Jan-1927 DOA: 02/03/2022  PCP: Lajean Manes, MD  Admit date: 02/03/2022 Discharge date: 02/11/2022  Admitted From: Home Discharge disposition: Home with home health    Brief narrative: Stephanie Ritter is a 88 y.o. female with PMH significant for HTN, CHF, A-fib on Eliquis, tinnitus. Patient presented to the ED on 7/7 with complaint of abdominal pain, nausea, vomiting. Initial labs showed elevated LFTs Ultrasound showed severe gallbladder distention to the level of right lower quadrant, hyperand hypoechoic material present in the gallbladder lumen, negative Murphy sign, severe intra and extrahepatic biliary dilatation. Admitted to hospitalist service GI and general surgery consulted 7/9, patient underwent ERCP.  Per GI note, stone and large amounts of sludge and debris's were cleared after multiple sweeps, negative occlusion cholangiogram. See below for details  Subjective: Patient was seen and examined this morning.   Sitting up in chair.  Not in distress.  Not on supplemental oxygen.  Pain controlled.  Slept well last night.  Daughter at bedside.  Both feel ready for discharge today.  Hospital course Choledocholithiasis Elevated liver enzymes -Presented with abdominal pain, nausea, vomiting.  Underwent ERCP. Per GI note, stone and large amounts of sludge and debris's were cleared after multiple sweeps, negative occlusion cholangiogram. -Liver enzymes improving as below. Recent Labs  Lab 02/06/22 0626 02/07/22 1740 02/08/22 0748 02/09/22 0611 02/10/22 0729  AST 44* '24 22 25 '$ 42*  ALT 130* 85* 64* 56* 58*  ALKPHOS 123 112 100 102 81  BILITOT 2.8* 2.2* 1.9* 1.3* 1.3*  PROT 5.6* 5.7* 5.8* 6.4* 5.8*  ALBUMIN 2.6* 2.6* 2.8* 2.8* 2.7*   Severe gallbladder distention Questionable gallbladder mass -Ultrasound 7/7 showed severe gallbladder distention to the level of right lower quadrant.  But  negative sonographic Murphy sign. -Seen by general surgery.  Cholecystectomy was deferred for now but 7/11, patient started having abdominal pain.   -Underwent lap chole on 7/13. -Completed 7-day course of IV Zosyn.    Persistent A-fib -Rate controlled with amiodarone, diltiazem.   -Eliquis to resume post discharge today  Hypertension -Continue Cardizem   Acute metabolic encephalopathy, underlying dementia -likely a degree of in-hospital delirium, fluctuating mental status but for most part she is pleasant, easily orientable, and anticipate improvement when she would get home. -Good night sleep and stable mental status with melatonin QHS  B12 deficiency -continue B12 supplement  Wounds:  - Incision (Closed) 08/07/18 Nose Medial (Active)  Date First Assessed/Time First Assessed: 08/07/18 0200   Location: (c) Nose  Location Orientation: Medial  Present on Admission: Yes    Assessments 08/07/2018  2:00 AM 08/09/2018  8:19 AM  Dressing Type Gauze (Comment) Adhesive strips  Dressing Clean;Intact;Dry Clean;Dry;Intact  Site / Wound Assessment Dressing in place / Unable to assess Dressing in place / Unable to assess  Treatment Other (Comment) --     No associated orders.     Incision (Closed) 08/07/18 Abdomen (Active)  Date First Assessed/Time First Assessed: 08/07/18 1500   Location: Abdomen    Assessments 08/07/2018  3:35 PM 08/09/2018  8:19 AM  Dressing Type Liquid skin adhesive Liquid skin adhesive  Dressing Clean;Dry;Intact Clean;Dry;Intact  Site / Wound Assessment Clean;Dry Clean;Dry;Other (Comment)  Margins Attached edges (approximated) --  Closure Skin glue --  Drainage Amount None --     No associated orders.     Incision - 3 Ports Abdomen Right;Upper Mid Left;Lower (Active)  Placement Date/Time: 08/07/18 1500   Location of Ports: Abdomen  Location  Orientation: Right;Upper  Location Orientation: Mid  Location Orientation: Left;Lower    Assessments 08/07/2018  8:30 PM  08/09/2018  8:19 AM  Port 1 Site Assessment Clean;Dry Clean;Dry  Port 1 Margins Attached edges (approximated) --  Port 1 Drainage Amount None None  Port 1 Dressing Type Liquid skin adhesive --  Port 2 Site Assessment Clean;Dry --  Port 2 Margins Attached edges (approximated) --  Port 2 Drainage Amount None --  Port 2 Dressing Type Liquid skin adhesive --  Port 3 Site Assessment Clean;Dry --  Port 3 Margins Attached edges (approximated) --  Port 3 Drainage Amount None --  Port 3 Dressing Type Liquid skin adhesive --     No associated orders.     Incision (Closed) 02/09/22 Abdomen (Active)  Date First Assessed/Time First Assessed: 02/09/22 1255   Location: Abdomen    Assessments 02/09/2022  1:05 PM 02/10/2022  8:00 PM  Dressing Type Liquid skin adhesive Liquid skin adhesive  Site / Wound Assessment Clean;Dry Clean;Dry  Margins Attached edges (approximated) Attached edges (approximated)  Closure Skin glue Skin glue  Drainage Amount None None     No associated orders.     Incision - 4 Ports Abdomen Umbilicus Mid;Upper Right;Medial Right;Lateral (Active)  Placement Date/Time: 02/09/22 1148   Location of Ports: Abdomen  Location Orientation: Umbilicus  Location Orientation: [place]  Location Orientation: Right;Medial  Location Orientation: Right;Lateral    Assessments 02/09/2022 12:55 PM 02/11/2022  8:40 AM  Port 1 Site Assessment Clean;Dry Clean;Dry  Port 1 Margins -- Attached edges (approximated)  Port 1 Drainage Amount -- None  Port 1 Dressing Type Liquid skin adhesive Liquid skin adhesive  Port 1 Dressing Status Clean, Dry, Intact Clean, Dry, Intact  Port 2 Site Assessment Clean;Dry Clean;Dry  Port 2 Margins -- Attached edges (approximated)  Port 2 Drainage Amount -- None  Port 2 Dressing Type Liquid skin adhesive Liquid skin adhesive  Port 2 Dressing Status Clean, Dry, Intact Clean, Dry, Intact  Port 3 Site Assessment Clean;Dry Clean;Dry  Port 3 Margins -- Attached edges  (approximated)  Port 3 Drainage Amount -- None  Port 3 Dressing Type Liquid skin adhesive Liquid skin adhesive  Port 3 Dressing Status Clean, Dry, Intact Clean, Dry, Intact  Port 4 Site Assessment Clean;Dry Clean;Dry  Port 4 Margins -- Attached edges (approximated)  Port 4 Drainage Amount -- None  Port 4 Dressing Type Liquid skin adhesive Liquid skin adhesive  Port 4 Dressing Status Clean, Dry, Intact Clean, Dry, Intact     No associated orders.    Discharge Exam:   Vitals:   02/10/22 0843 02/10/22 1501 02/10/22 2222 02/11/22 0453  BP:  114/75 130/81 111/83  Pulse: 68 (!) 109 (!) 107 (!) 110  Resp:  18 (!) 26 19  Temp:  97.8 F (36.6 C) 98 F (36.7 C) 97.7 F (36.5 C)  TempSrc:  Oral Oral Oral  SpO2: 92% 95% 93% 91%  Weight:      Height:        Body mass index is 27.78 kg/m.   General exam: Pleasant, elderly female.  Not in physical distress Skin: No rashes, lesions or ulcers. HEENT: Atraumatic, normocephalic, no obvious bleeding Lungs: Clear to auscultation bilaterally CVS: Rate controlled A-fib, no murmur GI/Abd soft, mild appropriate postop tenderness, nondistended, bowel sound present CNS: Alert, awake, oriented to place only.  Cheerfully demented Psychiatry: Mood appropriate Extremities: No pedal edema, no calf tenderness  Follow ups:    Follow-up Information     [facility] Go on 03/02/2022.   Specialty: General Surgery Why: Your appointment is 03/02/22 at 3:30 pm Arrive 42mn early to check in, fill out paperwork, Bring photo ID and insurance information Contact information: [phone]       SLajean Manes MD Follow up.   Specialty: Internal Medicine Contact information: [address] [phone]                Discharge Instructions:   Discharge Instructions     Call MD for:  difficulty breathing,  headache or visual disturbances   Complete by: As directed    Call MD for:  extreme fatigue   Complete by: As directed    Call MD for:  hives   Complete by: As directed    Call MD for:  persistant dizziness or light-headedness   Complete by: As directed    Call MD for:  persistant nausea and vomiting   Complete by: As directed    Call MD for:  severe uncontrolled pain   Complete by: As directed    Call MD for:  temperature >100.4   Complete by: As directed    Diet general   Complete by: As directed    Discharge instructions   Complete by: As directed    General discharge instructions: Follow with Primary MD SLajean Manes MD in 7 days  Please request your PCP  to go over your hospital tests, procedures, radiology results at the follow up. Please get your medicines reviewed and adjusted.  Your PCP may decide to repeat certain labs or tests as needed. Do not drive, operate heavy machinery, perform activities at heights, swimming or participation in water activities or provide baby sitting services if your were admitted for syncope or siezures until you have seen by Primary MD or a Neurologist and advised to do so again. [place]. Do not drive, operate heavy machinery, perform activities at heights, swim, participate in water activities or provide baby-sitting services while on medications for pain, sleep and mood until your outpatient physician has reevaluated you and advised to do so again.  You are strongly recommended to comply with the dose, frequency and duration of prescribed medications. Activity: As tolerated with Full fall precautions use walker/cane & assistance as needed Avoid using any recreational substances like cigarette, tobacco, alcohol, or non-prescribed drug. If you experience worsening of your admission symptoms, develop shortness of breath, life threatening emergency, suicidal or homicidal thoughts you must  seek medical attention immediately by calling 911 or calling your MD immediately  if symptoms less severe. You must read complete instructions/literature along with all the possible adverse reactions/side effects for all the medicines you take and that have been prescribed to you. Take any new medicine only after you have completely understood and accepted all the possible adverse reactions/side effects.  Wear Seat belts while driving. You were cared for by a hospitalist during your hospital stay. If you have any questions about your discharge medications or the care you received while you were in the hospital after you are discharged, you can call the unit and ask to speak with the hospitalist or the covering physician. Once you are discharged, your primary care physician will handle any further medical issues. Please note that NO REFILLS for any discharge medications will be authorized once you are discharged, as it is imperative that  you return to your primary care physician (or establish a relationship with a primary care physician if you do not have one).   Discharge wound care:   Complete by: As directed    Increase activity slowly   Complete by: As directed        Discharge Medications:   Allergies as of 02/11/2022       Reactions   Asa [aspirin] Other (See Comments)   Excessive bleeding   Nsaids Other (See Comments)   Bleeding, ulcers   Mirtazapine Other (See Comments)   Very groggy        Medication List     TAKE these medications    acetaminophen 500 MG tablet Commonly known as: TYLENOL Take 1,000 mg by mouth daily as needed (for pain).   amiodarone 200 MG tablet Commonly known as: PACERONE Take 0.5 tablets (100 mg total) by mouth every Monday, Wednesday, and Friday.   diltiazem 300 MG 24 hr capsule Commonly known as: CARDIZEM CD Take 300 mg by mouth daily.   Eliquis 5 MG Tabs tablet Generic drug: apixaban TAKE 1 TABLET BY MOUTH TWICE  DAILY What changed: how  much to take   escitalopram 20 MG tablet Commonly known as: LEXAPRO Take 20 mg by mouth daily.   EYE DROPS OP Place 2 drops into both eyes as needed (for dry eyes).   HYDROcodone-acetaminophen 5-325 MG tablet Commonly known as: NORCO/VICODIN Take 1 tablet by mouth every 6 (six) hours as needed for up to 5 days for moderate pain.   ipratropium 0.03 % nasal spray Commonly known as: ATROVENT Place 1 spray into both nostrils as needed.   melatonin 3 MG Tabs tablet Take 1 tablet (3 mg total) by mouth at bedtime.   Myrbetriq 25 MG Tb24 tablet Generic drug: mirabegron ER Take 25 mg by mouth daily.   polyethylene glycol 17 g packet Commonly known as: MIRALAX / GLYCOLAX Take 17 g by mouth daily as needed for mild constipation.   PreserVision/Lutein Caps Take 1 capsule by mouth 2 (two) times daily.   senna-docusate 8.6-50 MG tablet Commonly known as: Senokot-S Take 1 tablet by mouth at bedtime for 14 days.   Vitamin B12 1000 MCG Tbcr Take 1,000 mcg by mouth daily.   Vitamin D3 25 MCG (1000 UT) Caps Take 1,000 Units by mouth See admin instructions. Take 1 capsule by mouth four times a week               Discharge Care Instructions  (From admission, onward)           Start     Ordered   02/11/22 0000  Discharge wound care:        02/11/22 1142             The results of significant diagnostics from this hospitalization (including imaging, microbiology, ancillary and laboratory) are listed below for reference.    Procedures and Diagnostic Studies:   US Abdomen Limited RUQ (LIVER/GB)  Result Date: 02/03/2022 CLINICAL DATA:  Elevated LFT EXAM: ULTRASOUND ABDOMEN LIMITED RIGHT UPPER QUADRANT COMPARISON:  CT 02/03/2022 FINDINGS: Gallbladder: Markedly dilated. Moderate sludge within the gallbladder. No shadowing stones. Echogenic area within the lumen of the gallbladder containing vascularity suggesting solid tissue. Negative sonographic Murphy. Common bile duct:  Diameter: 13 mm Liver: Positive for intra hepatic biliary dilatation. No focal hepatic abnormality. Portal vein is patent on color Doppler imaging with normal direction of blood flow towards the liver. Other: None. IMPRESSION: 1. Severe gallbladder  distension to the level of right lower quadrant. Hyper and hypoechoic material is present within the gallbladder lumen, this appears to contain vascularity and raises concern for gallbladder mass lesion. Surgical consultation is recommended. Negative sonographic Murphy 2. Severe intra and extrahepatic biliary dilatation. Electronically Signed   By: Donavan Foil M.D.   On: 02/03/2022 18:20   CT Abdomen Pelvis W Contrast  Result Date: 02/03/2022 CLINICAL DATA:  Abdominal pain, nausea, vomiting, elevated liver enzymes EXAM: CT ABDOMEN AND PELVIS WITH CONTRAST TECHNIQUE: Multidetector CT imaging of the abdomen and pelvis was performed using the standard protocol following bolus administration of intravenous contrast. RADIATION DOSE REDUCTION: This exam was performed according to the departmental dose-optimization program which includes automated exposure control, adjustment of the mA and/or kV according to patient size and/or use of iterative reconstruction technique. CONTRAST:  152m OMNIPAQUE IOHEXOL 300 MG/ML  SOLN COMPARISON:  01/06/2019 FINDINGS: Lower chest: Cardiomegaly. Coronary artery calcifications. Trace bilateral pleural effusions. Irregular scarring and or atelectasis in the bilateral lung bases. Hepatobiliary: No solid liver abnormality is seen. Severe intra and extrahepatic biliary ductal dilatation, central common bile duct measuring up to 1.7 cm in caliber. There is a faintly calcified calculus near the ampulla measuring 0.9 cm (series 4, image 24). Faintly calcified sludge and or additional gallstones in the gallbladder. Pancreas: Unremarkable. No pancreatic ductal dilatation or surrounding inflammatory changes. Spleen: Normal in size without significant  abnormality. Adrenals/Urinary Tract: Adrenal glands are unremarkable. Simple, benign bilateral renal cortical cysts, for which no further follow-up or characterization is required. Kidneys are otherwise normal, without renal calculi, solid lesion, or hydronephrosis. Bladder is unremarkable. Stomach/Bowel: Stomach is within normal limits. Appendix not clearly visualized. No evidence of bowel wall thickening, distention, or inflammatory changes. Severe pancolonic diverticulosis. Vascular/Lymphatic: Aortic atherosclerosis. No enlarged abdominal or pelvic lymph nodes. Reproductive: No mass or other significant abnormality. Other: No abdominal wall hernia or abnormality. No ascites. Musculoskeletal: No acute or significant osseous findings. Status post bilateral hip arthroplasty. IMPRESSION: 1. Severe intra and extrahepatic biliary ductal dilatation, central common bile duct measuring up to 1.7 cm in caliber. There is a faintly calcified calculus near the ampulla measuring 0.9 cm. Faintly calcified sludge and or additional gallstones in the distended gallbladder. Findings are consistent with choledocholithiasis. 2. Severe pancolonic diverticulosis without evidence of acute diverticulitis. 3. Cardiomegaly and coronary artery disease. 4. Trace bilateral pleural effusions. Aortic Atherosclerosis (ICD10-I70.0). Electronically Signed   By: ADelanna AhmadiM.D.   On: 02/03/2022 16:06     Labs:   Basic Metabolic Panel: Recent Labs  Lab 02/05/22 0626 02/06/22 0626 02/07/22 0258507/12/23 0748 02/09/22 0611 02/10/22 0729  NA 137 139 140 143 147* 139  K 3.1* 4.1 3.6 3.1* 3.7 4.4  CL 104 107 107 106 111 106  CO2 '23 23 27 26 29 25  '$ GLUCOSE 61* 79 114* 97 100* 134*  BUN '19 19 18 13 15 20  '$ CREATININE 0.94 0.84 0.70 0.81 1.00 0.79  CALCIUM 7.8* 8.0* 7.9* 8.0* 8.4* 8.1*  MG 2.2  --   --  1.9  --   --    GFR Estimated Creatinine Clearance: 43.1 mL/min (by C-G formula based on SCr of 0.79 mg/dL). Liver Function  Tests: Recent Labs  Lab 02/06/22 0626 02/07/22 0277807/12/23 0748 02/09/22 0611 02/10/22 0729  AST 44* '24 22 25 '$ 42*  ALT 130* 85* 64* 56* 58*  ALKPHOS 123 112 100 102 81  BILITOT 2.8* 2.2* 1.9* 1.3* 1.3*  PROT 5.6* 5.7* 5.8* 6.4* 5.8*  ALBUMIN 2.6* 2.6* 2.8* 2.8* 2.7*   Recent Labs  Lab 02/07/22 1348 02/09/22 0611  LIPASE 17 17   No results for input(s): "AMMONIA" in the last 168 hours. Coagulation profile No results for input(s): "INR", "PROTIME" in the last 168 hours.  CBC: Recent Labs  Lab 02/07/22 0638 02/08/22 0748 02/09/22 0611 02/10/22 0729 02/11/22 0605  WBC 6.8 7.1 6.7 11.8* 10.9*  NEUTROABS  --   --  3.7  --   --   HGB 13.0 13.8 14.4 12.7 11.4*  HCT 39.7 42.0 45.5 39.3 35.3*  MCV 94.3 94.2 95.8 96.1 96.2  PLT 120* 151 204 206 231   Cardiac Enzymes: No results for input(s): "CKTOTAL", "CKMB", "CKMBINDEX", "TROPONINI" in the last 168 hours. BNP: Invalid input(s): "POCBNP" CBG: Recent Labs  Lab 02/05/22 1309 02/05/22 1441  GLUCAP 60* 104*   D-Dimer No results for input(s): "DDIMER" in the last 72 hours. Hgb A1c No results for input(s): "HGBA1C" in the last 72 hours. Lipid Profile No results for input(s): "CHOL", "HDL", "LDLCALC", "TRIG", "CHOLHDL", "LDLDIRECT" in the last 72 hours. Thyroid function studies No results for input(s): "TSH", "T4TOTAL", "T3FREE", "THYROIDAB" in the last 72 hours.  Invalid input(s): "FREET3" Anemia work up No results for input(s): "VITAMINB12", "FOLATE", "FERRITIN", "TIBC", "IRON", "RETICCTPCT" in the last 72 hours. Microbiology No results found for this or any previous visit (from the past 240 hour(s)).  Time coordinating discharge: 35 minutes  Signed: Shawny Borkowski  [facility] 02/11/2022, 1:38 PM

## 2022-02-11 NOTE — Discharge Instructions (Signed)
[facility] ______[facility] LAPAROSCOPIC SURGERY: POST OP INSTRUCTIONS Always review your discharge instruction sheet given to you by the facility where your surgery was performed. IF YOU HAVE DISABILITY OR FAMILY LEAVE FORMS, YOU MUST BRING THEM TO THE OFFICE FOR PROCESSING.   DO NOT GIVE THEM TO YOUR DOCTOR.  A prescription for pain medication may be given to you upon discharge.  Take your pain medication as prescribed, if needed.  If narcotic pain medicine is not needed, then you may take acetaminophen (Tylenol) or ibuprofen (Advil) as needed. Take your usually prescribed medications unless otherwise directed. If you need a refill on your pain medication, please contact your pharmacy.  They will contact our office to request authorization. Prescriptions will not be filled after 5pm or on week-ends. You should follow a light diet the first few days after arrival home, such as soup and crackers, etc.  Be sure to include lots of fluids daily. Most patients will experience some swelling and bruising in the area of the incisions.  Ice packs will help.  Swelling and bruising can take several days to resolve.  It is common to experience some constipation if taking pain medication after surgery.  Increasing fluid intake and taking a stool softener (such as Colace) will usually help or prevent this problem from occurring.  A mild laxative (Milk of Magnesia or Miralax) should be taken according to package instructions if there are no bowel movements after 48 hours. Unless discharge instructions indicate otherwise, you may remove your bandages 24-48 hours after surgery, and you may shower at that time.  You may have steri-strips (small skin tapes) in place directly over the incision.  These strips should be left on the skin for 7-10 days.  If your surgeon used skin glue on the incision, you may shower in 24 hours.  The glue will flake off over the next 2-3 weeks.  Any sutures or staples will be  removed at the office during your follow-up visit. ACTIVITIES:  You may resume regular (light) daily activities beginning the next day--such as daily self-care, walking, climbing stairs--gradually increasing activities as tolerated.  You may have sexual intercourse when it is comfortable.  Refrain from any heavy lifting or straining until approved by your doctor. You may drive when you are no longer taking prescription pain medication, you can comfortably wear a seatbelt, and you can safely maneuver your car and apply brakes. RETURN TO WORK:  __________________________________________________________ You should see your doctor in the office for a follow-up appointment approximately 2-3 weeks after your surgery.  Make sure that you call for this appointment within a day or two after you arrive home to insure a convenient appointment time. OTHER INSTRUCTIONS: __________________________________________________________________________________________________________________________ __________________________________________________________________________________________________________________________ WHEN TO CALL YOUR DOCTOR: Fever over 101.0 Inability to urinate Continued bleeding from incision. Increased pain, redness, or drainage from the incision. Increasing abdominal pain  The clinic staff is available to answer your questions during regular business hours.  Please don't hesitate to call and ask to speak to one of the nurses for clinical concerns.  If you have a medical emergency, go to the nearest emergency room or call 911.  A surgeon from [facility] is always on call at the hospital. [address], [address], [place], [place]  [address] ? [address] [address], [place], [place]   [address] [phone] ? [phone] ? FAX [phone] Web site: [email or web address]  

## 2022-02-11 NOTE — Progress Notes (Signed)
Patient discharged home with daughter in stable condition. Discharge instructions given. Scripts sent to pharmacy of choice. No immediate questions or concerns at this time. DC'd from unit via wheelchair

## 2022-02-11 NOTE — Plan of Care (Signed)

## 2022-02-11 NOTE — Progress Notes (Signed)
2 Days Post-Op   Subjective/Chief Complaint: Slept well last night No complaints this morning Tolerating po but no appetite Did well with PT   Objective: Vital signs in last 24 hours: Temp:  [97.7 F (36.5 C)-98 F (36.7 C)] 97.7 F (36.5 C) (07/15 0453) Pulse Rate:  [68-110] 110 (07/15 0453) Resp:  [18-26] 19 (07/15 0453) BP: (111-130)/(75-83) 111/83 (07/15 0453) SpO2:  [91 %-95 %] 91 % (07/15 0453) Last BM Date : 02/07/22  Intake/Output from previous day: 07/14 0701 - 07/15 0700 In: 1672.9 [P.O.:480; I.V.:1192.9] Out: -  Intake/Output this shift: No intake/output data recorded.  Exam: Awake and alert Abdomen soft, ND, minimally tender  Lab Results:  Recent Labs    02/10/22 0729 02/11/22 0605  WBC 11.8* 10.9*  HGB 12.7 11.4*  HCT 39.3 35.3*  PLT 206 231   BMET Recent Labs    02/09/22 0611 02/10/22 0729  NA 147* 139  K 3.7 4.4  CL 111 106  CO2 29 25  GLUCOSE 100* 134*  BUN 15 20  CREATININE 1.00 0.79  CALCIUM 8.4* 8.1*   PT/INR No results for input(s): "LABPROT", "INR" in the last 72 hours. ABG No results for input(s): "PHART", "HCO3" in the last 72 hours.  Invalid input(s): "PCO2", "PO2"  Studies/Results: No results found.  Anti-infectives: Anti-infectives (From admission, onward)    Start     Dose/Rate Route Frequency Ordered Stop   02/04/22 0800  piperacillin-tazobactam (ZOSYN) IVPB 3.375 g  Status:  Discontinued        3.375 g 12.5 mL/hr over 240 Minutes Intravenous Every 8 hours 02/04/22 0227 02/09/22 1356   02/03/22 2315  piperacillin-tazobactam (ZOSYN) IVPB 3.375 g        3.375 g 100 mL/hr over 30 Minutes Intravenous  Once 02/03/22 2313 02/04/22 0020       Assessment/Plan: Choledocholithiasis POD#2 s/p laparoscopic cholecystectomy 7/13 Dr. Brantley Stage  Continues to do well from a surgical standpoint Can resume Eliquis and discharge from our standpoint  Coralie Keens MD 02/11/2022

## 2022-02-14 DIAGNOSIS — F0283 Dementia in other diseases classified elsewhere, unspecified severity, with mood disturbance: Secondary | ICD-10-CM | POA: Diagnosis not present

## 2022-02-14 DIAGNOSIS — I110 Hypertensive heart disease with heart failure: Secondary | ICD-10-CM | POA: Diagnosis not present

## 2022-02-14 DIAGNOSIS — Z7901 Long term (current) use of anticoagulants: Secondary | ICD-10-CM | POA: Diagnosis not present

## 2022-02-14 DIAGNOSIS — Z48815 Encounter for surgical aftercare following surgery on the digestive system: Secondary | ICD-10-CM | POA: Diagnosis not present

## 2022-02-14 DIAGNOSIS — F32A Depression, unspecified: Secondary | ICD-10-CM | POA: Diagnosis not present

## 2022-02-14 DIAGNOSIS — B179 Acute viral hepatitis, unspecified: Secondary | ICD-10-CM | POA: Diagnosis not present

## 2022-02-14 DIAGNOSIS — I509 Heart failure, unspecified: Secondary | ICD-10-CM | POA: Diagnosis not present

## 2022-02-14 DIAGNOSIS — F0284 Dementia in other diseases classified elsewhere, unspecified severity, with anxiety: Secondary | ICD-10-CM | POA: Diagnosis not present

## 2022-02-14 DIAGNOSIS — E538 Deficiency of other specified B group vitamins: Secondary | ICD-10-CM | POA: Diagnosis not present

## 2022-02-14 DIAGNOSIS — Z87891 Personal history of nicotine dependence: Secondary | ICD-10-CM | POA: Diagnosis not present

## 2022-02-14 DIAGNOSIS — I4819 Other persistent atrial fibrillation: Secondary | ICD-10-CM | POA: Diagnosis not present

## 2022-02-14 DIAGNOSIS — G9341 Metabolic encephalopathy: Secondary | ICD-10-CM | POA: Diagnosis not present

## 2022-02-17 DIAGNOSIS — R350 Frequency of micturition: Secondary | ICD-10-CM | POA: Diagnosis not present

## 2022-02-17 DIAGNOSIS — I480 Paroxysmal atrial fibrillation: Secondary | ICD-10-CM | POA: Diagnosis not present

## 2022-02-17 DIAGNOSIS — G301 Alzheimer's disease with late onset: Secondary | ICD-10-CM | POA: Diagnosis not present

## 2022-02-17 DIAGNOSIS — I129 Hypertensive chronic kidney disease with stage 1 through stage 4 chronic kidney disease, or unspecified chronic kidney disease: Secondary | ICD-10-CM | POA: Diagnosis not present

## 2022-02-17 DIAGNOSIS — Z79899 Other long term (current) drug therapy: Secondary | ICD-10-CM | POA: Diagnosis not present

## 2022-02-17 DIAGNOSIS — N1832 Chronic kidney disease, stage 3b: Secondary | ICD-10-CM | POA: Diagnosis not present

## 2022-02-23 DIAGNOSIS — I110 Hypertensive heart disease with heart failure: Secondary | ICD-10-CM | POA: Diagnosis not present

## 2022-02-23 DIAGNOSIS — I509 Heart failure, unspecified: Secondary | ICD-10-CM | POA: Diagnosis not present

## 2022-02-23 DIAGNOSIS — I4819 Other persistent atrial fibrillation: Secondary | ICD-10-CM | POA: Diagnosis not present

## 2022-02-23 DIAGNOSIS — E538 Deficiency of other specified B group vitamins: Secondary | ICD-10-CM | POA: Diagnosis not present

## 2022-02-23 DIAGNOSIS — Z48815 Encounter for surgical aftercare following surgery on the digestive system: Secondary | ICD-10-CM | POA: Diagnosis not present

## 2022-02-23 DIAGNOSIS — B179 Acute viral hepatitis, unspecified: Secondary | ICD-10-CM | POA: Diagnosis not present

## 2022-02-24 DIAGNOSIS — I110 Hypertensive heart disease with heart failure: Secondary | ICD-10-CM | POA: Diagnosis not present

## 2022-02-24 DIAGNOSIS — Z48815 Encounter for surgical aftercare following surgery on the digestive system: Secondary | ICD-10-CM | POA: Diagnosis not present

## 2022-02-24 DIAGNOSIS — I509 Heart failure, unspecified: Secondary | ICD-10-CM | POA: Diagnosis not present

## 2022-02-24 DIAGNOSIS — E538 Deficiency of other specified B group vitamins: Secondary | ICD-10-CM | POA: Diagnosis not present

## 2022-02-24 DIAGNOSIS — B179 Acute viral hepatitis, unspecified: Secondary | ICD-10-CM | POA: Diagnosis not present

## 2022-02-24 DIAGNOSIS — I4819 Other persistent atrial fibrillation: Secondary | ICD-10-CM | POA: Diagnosis not present

## 2022-02-27 DIAGNOSIS — B179 Acute viral hepatitis, unspecified: Secondary | ICD-10-CM | POA: Diagnosis not present

## 2022-02-27 DIAGNOSIS — I4819 Other persistent atrial fibrillation: Secondary | ICD-10-CM | POA: Diagnosis not present

## 2022-02-27 DIAGNOSIS — E538 Deficiency of other specified B group vitamins: Secondary | ICD-10-CM | POA: Diagnosis not present

## 2022-02-27 DIAGNOSIS — Z48815 Encounter for surgical aftercare following surgery on the digestive system: Secondary | ICD-10-CM | POA: Diagnosis not present

## 2022-02-27 DIAGNOSIS — I509 Heart failure, unspecified: Secondary | ICD-10-CM | POA: Diagnosis not present

## 2022-02-27 DIAGNOSIS — I110 Hypertensive heart disease with heart failure: Secondary | ICD-10-CM | POA: Diagnosis not present

## 2022-03-01 DIAGNOSIS — I110 Hypertensive heart disease with heart failure: Secondary | ICD-10-CM | POA: Diagnosis not present

## 2022-03-01 DIAGNOSIS — I4819 Other persistent atrial fibrillation: Secondary | ICD-10-CM | POA: Diagnosis not present

## 2022-03-01 DIAGNOSIS — B179 Acute viral hepatitis, unspecified: Secondary | ICD-10-CM | POA: Diagnosis not present

## 2022-03-01 DIAGNOSIS — Z48815 Encounter for surgical aftercare following surgery on the digestive system: Secondary | ICD-10-CM | POA: Diagnosis not present

## 2022-03-01 DIAGNOSIS — E538 Deficiency of other specified B group vitamins: Secondary | ICD-10-CM | POA: Diagnosis not present

## 2022-03-01 DIAGNOSIS — I509 Heart failure, unspecified: Secondary | ICD-10-CM | POA: Diagnosis not present

## 2022-03-02 ENCOUNTER — Telehealth (HOSPITAL_COMMUNITY): Payer: Self-pay

## 2022-03-02 ENCOUNTER — Ambulatory Visit (HOSPITAL_COMMUNITY)
Admission: RE | Admit: 2022-03-02 | Discharge: 2022-03-02 | Disposition: A | Discharge status: Home / Self Care | Payer: Medicare Other | Source: Ambulatory Visit | Attending: Nurse Practitioner | Admitting: Nurse Practitioner

## 2022-03-02 VITALS — BP 104/60 | HR 74 | Ht 64.0 in | Wt 142.2 lb

## 2022-03-02 DIAGNOSIS — D6869 Other thrombophilia: Secondary | ICD-10-CM | POA: Diagnosis not present

## 2022-03-02 DIAGNOSIS — Z79899 Other long term (current) drug therapy: Secondary | ICD-10-CM | POA: Insufficient documentation

## 2022-03-02 DIAGNOSIS — I10 Essential (primary) hypertension: Secondary | ICD-10-CM | POA: Diagnosis not present

## 2022-03-02 DIAGNOSIS — I4892 Unspecified atrial flutter: Secondary | ICD-10-CM | POA: Diagnosis not present

## 2022-03-02 DIAGNOSIS — I4819 Other persistent atrial fibrillation: Secondary | ICD-10-CM | POA: Insufficient documentation

## 2022-03-02 DIAGNOSIS — Z7901 Long term (current) use of anticoagulants: Secondary | ICD-10-CM | POA: Diagnosis not present

## 2022-03-02 MED ORDER — DILTIAZEM HCL ER COATED BEADS 240 MG PO CP24: 240.0000 mg | ORAL_CAPSULE | Freq: Every day | ORAL | 6 refills | Status: AC

## 2022-03-02 NOTE — Patient Instructions (Signed)
Decrease cardizem to '240mg'$  once a day

## 2022-03-02 NOTE — Progress Notes (Signed)
Primary Care Physician: Lajean Manes, MD Referring Physician: Dr. Felipa Eth EP: Dr. Dahlia Bailiff Stephanie Ritter is a 88 y.o. female with a h/o paroxysmal afib with conversion to SR with amiodarone, which did not require a cardioversion. She is s/p hernia repair 08/07/18 and has recovered nicely. She did note one episode of bradycardia associated with some lightheadedness after her surgery. Denies syncope or falls.  F/u in afib clinic, 7/1. She has had her  meds adjusted and wore a monitor for her daughter reporting that times her reporting that she didn't feel good. Her monitor did not show any afib or significant brady/pauses. She did have some junctional rhythm, which she triggered but also triggered some SR. Today in the office, she does not report any significant issues, no weak or lightheaded spells. No falls. She thinks she is just bored and depressed for having to stay in so much with covid.   F/u in afib clinic, 05/15/19. She feels at her baseline. Feels alittle  fatigue at times but no unusual dizziness. She is in SR today.   F/u in afib clinic, 11/20/19. She  is in SR and feels well. The daughter states that she has had a good 6 months. She has had her covid shots. No bleeding  Issues with eliquis, CHA2DS2VASc of 5.   F/u in afib clinic, 05/25/20. She is in the afib clinic for f/u. She remains in SR. She has had an uneventful 6 months. Continues  on amiodarone 100 mg daily and eliquis  5 mg bid. Her labs were last scheduled with Dr.Stoneking in August.  Will request for review.   F/u in afib clinic, 11/23/20. She reports that she is doing well staying in [place]. Continues  on low dose amiodarone at 100 mg daily. No issues with eliquis. Had labs (reviewed) from  PCP recently and stable. No shortness of breath or cough.   F/u in afib clinic, 03/02/22. Daughter wanted her to be seen as her BP has been running lower sine GB surgery 3 weeks ago.noted by PT coming into the home. She has lost  around 15 lbs since I saw her  last. Appetite has been reduced since surgery. No afib to report. Will try to reduce cardizem to allow more BP.   Today, she denies symptoms of palpitations, chest pain, shortness of breath, orthopnea, PND, lower extremity edema, presyncope, syncope, or neurologic sequela.  The patient is tolerating medications without difficulties and is otherwise without complaint today.   Past Medical History:  Diagnosis Date   CHF (congestive heart failure) (HCC)    Hypertension    Persistent atrial fibrillation (HCC)    Tinnitus    Past Surgical History:  Procedure Laterality Date   APPENDECTOMY     60 years ago   ATRIAL FIBRILLATION ABLATION N/A    8 years ago   BOWEL RESECTION N/A 08/07/2018   Procedure: SMALL BOWEL RESECTION;  Surgeon: Clovis Riley, MD;  Location: [facility];  Service: General;  Laterality: N/A;   CHOLECYSTECTOMY N/A 02/09/2022   Procedure: LAPAROSCOPIC CHOLECYSTECTOMY;  Surgeon: Erroll Luna, MD;  Location: [facility];  Service: General;  Laterality: N/A;   ERCP N/A 02/05/2022   Procedure: ENDOSCOPIC RETROGRADE CHOLANGIOPANCREATOGRAPHY (ERCP);  Surgeon: Gatha Mayer, MD;  Location: [facility];  Service: Gastroenterology;  Laterality: N/A;   hip replacement Bilateral    4 and 8 years ago   [place] Right 08/07/2018   Procedure: Laparoscopic right femoral hernia repair with mesh, possible  open, possible bowel resection;  Surgeon: Clovis Riley, MD;  Location: [facility];  Service: General;  Laterality: Right;   REMOVAL OF STONES  02/05/2022   Procedure: REMOVAL OF STONES;  Surgeon: Gatha Mayer, MD;  Location: [facility];  Service: Gastroenterology;;   Stephanie Ritter  02/05/2022   Procedure: Stephanie Ritter;  Surgeon: Gatha Mayer, MD;  Location: [facility];  Service: Gastroenterology;;    Current Outpatient Medications  Medication Sig Dispense Refill   acetaminophen (TYLENOL) 500 MG tablet Take 1,000 mg by mouth daily as needed  (for pain).     amiodarone (PACERONE) 200 MG tablet Take 0.5 tablets (100 mg total) by mouth every Monday, Wednesday, and Friday. 45 tablet 3   Carboxymethylcellulose Sodium (EYE DROPS OP) Place 2 drops into both eyes as needed (for dry eyes).      Cholecalciferol (VITAMIN D3) 25 MCG (1000 UT) CAPS Take 1,000 Units by mouth See admin instructions. Take 1 capsule by mouth four times a week     Cyanocobalamin (VITAMIN B12) 1000 MCG TBCR Take 1,000 mcg by mouth daily.     ELIQUIS 5 MG TABS tablet TAKE 1 TABLET BY MOUTH TWICE  DAILY (Patient taking differently: Take 5 mg by mouth 2 (two) times daily.) 180 tablet 3   escitalopram (LEXAPRO) 20 MG tablet Take 20 mg by mouth daily.   11   ipratropium (ATROVENT) 0.03 % nasal spray Place 1 spray into both nostrils as needed.     melatonin 3 MG TABS tablet Take 1 tablet (3 mg total) by mouth at bedtime. (Patient taking differently: Take 3 mg by mouth at bedtime as needed.) 30 tablet 0   Multiple Vitamins-Minerals (PRESERVISION/LUTEIN) CAPS Take 1 capsule by mouth 2 (two) times daily.      MYRBETRIQ 25 MG TB24 tablet Take 25 mg by mouth daily.     polyethylene glycol (MIRALAX / GLYCOLAX) 17 g packet Take 17 g by mouth daily as needed for mild constipation. 14 each 0   diltiazem (CARDIZEM CD) 240 MG 24 hr capsule Take 1 capsule (240 mg total) by mouth daily. 30 capsule 6   No current facility-administered medications for this encounter.    Allergies  Allergen Reactions   Asa [Aspirin] Other (See Comments)    Excessive bleeding   Nsaids Other (See Comments)    Bleeding, ulcers   Mirtazapine Other (See Comments)    Very groggy    Social History   Socioeconomic History   Marital status: Married    Spouse name: Not on file   Number of children: Not on file   Years of education: Not on file   Highest education level: Not on file  Occupational History   Not on file  Tobacco Use   Smoking status: Former    Types: Cigarettes   Smokeless tobacco:  Never   Tobacco comments:    Pt quit 50+ years ago  Vaping Use   Vaping Use: Never used  Substance and Sexual Activity   Alcohol use: Yes    Alcohol/week: 1.0 standard drink of alcohol    Types: 1 Glasses of wine per week    Comment: occasionally   Drug use: No   Sexual activity: Not on file  Other Topics Concern   Not on file  Social History Narrative   Not on file   Social Determinants of Health   Financial Resource Strain: Not on file  Food Insecurity: Not on file  Transportation Needs: Not on file  Physical Activity:  Not on file  Stress: Not on file  Social Connections: Not on file  Intimate Partner Violence: Not on file    Family History  Family history unknown: Yes    ROS- All systems are reviewed and negative except as per the HPI above  Physical Exam: Vitals:   03/02/22 1400  BP: 104/60  Pulse: 74  Weight: 64.5 kg  Height: '5\' 4"'$  (1.626 m)   Wt Readings from Last 3 Encounters:  03/02/22 64.5 kg  02/09/22 73.4 kg  08/31/21 69.9 kg    Labs: Lab Results  Component Value Date   NA 139 02/10/2022   K 4.4 02/10/2022   CL 106 02/10/2022   CO2 25 02/10/2022   GLUCOSE 134 (H) 02/10/2022   BUN 20 02/10/2022   CREATININE 0.79 02/10/2022   CALCIUM 8.1 (L) 02/10/2022   MG 1.9 02/08/2022   No results found for: "INR" No results found for: "CHOL", "HDL", "[place]", "TRIG"   GEN- The patient is well appearing elderly female, alert and oriented x 3 today.   HEENT-head normocephalic, atraumatic, sclera clear, conjunctiva pink, hearing intact, trachea midline. Lungs- Clear to ausculation bilaterally, normal work of breathing Heart- Regular rate and rhythm, no murmurs, rubs or gallops  GI- soft, NT, ND, + BS Extremities- no clubbing, cyanosis, or edema MS- no significant deformity or atrophy Skin- no rash or lesion Psych- euthymic mood, full affect Neuro- strength and sensation are intact   EKG-  Vent. rate 74 BPM PR interval 218 ms QRS duration 110  ms QT/QTcB 420/466 ms P-R-T axes 64 -61 61 Sinus rhythm with 1st degree A-V block Left anterior fascicular block Moderate voltage criteria for LVH, may be normal variant ( R in aVL , Cornell product ) Abnormal ECG When compared with ECG of 04-Feb-2022 09:26, PREVIOUS ECG IS PRESENT   Assessment and Plan: 1. Persistent Afib/flutter Continues in SR Recent GB surgery with decreased appetite, weight loss and lower BP Continue  Amiodarone 100 mg 3x a week Continue Eliquis 5 mg BID, CHA2DS2VASc score of 5 Labs recently drawn by Dr. Felipa Eth and stable   2. HTN Soft since surgery per daughter  Decrease  diltiazem dose to  240 mg daily Encourage water intake Small frequent meals   F/u in 6 months  Butch Penny C. Lean Jaeger, [facility] [address] [place], [place] [address] [phone]

## 2022-03-02 NOTE — Telephone Encounter (Signed)
Patient's daughter called in about her mom. HR is 110 and B/P -110 top number and diastolic between 24-46. She is feeling tired and having shortness of breath on exertion. Scheduled appointment for her to come in today at 2:00 pm to see Roderic Palau NP. Communicated with her daughter regarding appointment and she was given the parking code for August. Daugter verbalized understanding.

## 2022-03-03 DIAGNOSIS — E538 Deficiency of other specified B group vitamins: Secondary | ICD-10-CM | POA: Diagnosis not present

## 2022-03-03 DIAGNOSIS — Z48815 Encounter for surgical aftercare following surgery on the digestive system: Secondary | ICD-10-CM | POA: Diagnosis not present

## 2022-03-03 DIAGNOSIS — I110 Hypertensive heart disease with heart failure: Secondary | ICD-10-CM | POA: Diagnosis not present

## 2022-03-03 DIAGNOSIS — I4819 Other persistent atrial fibrillation: Secondary | ICD-10-CM | POA: Diagnosis not present

## 2022-03-03 DIAGNOSIS — I509 Heart failure, unspecified: Secondary | ICD-10-CM | POA: Diagnosis not present

## 2022-03-03 DIAGNOSIS — B179 Acute viral hepatitis, unspecified: Secondary | ICD-10-CM | POA: Diagnosis not present

## 2022-03-06 DIAGNOSIS — I509 Heart failure, unspecified: Secondary | ICD-10-CM | POA: Diagnosis not present

## 2022-03-06 DIAGNOSIS — B179 Acute viral hepatitis, unspecified: Secondary | ICD-10-CM | POA: Diagnosis not present

## 2022-03-06 DIAGNOSIS — E538 Deficiency of other specified B group vitamins: Secondary | ICD-10-CM | POA: Diagnosis not present

## 2022-03-06 DIAGNOSIS — Z48815 Encounter for surgical aftercare following surgery on the digestive system: Secondary | ICD-10-CM | POA: Diagnosis not present

## 2022-03-06 DIAGNOSIS — I4819 Other persistent atrial fibrillation: Secondary | ICD-10-CM | POA: Diagnosis not present

## 2022-03-06 DIAGNOSIS — I110 Hypertensive heart disease with heart failure: Secondary | ICD-10-CM | POA: Diagnosis not present

## 2022-03-08 DIAGNOSIS — I509 Heart failure, unspecified: Secondary | ICD-10-CM | POA: Diagnosis not present

## 2022-03-08 DIAGNOSIS — I110 Hypertensive heart disease with heart failure: Secondary | ICD-10-CM | POA: Diagnosis not present

## 2022-03-08 DIAGNOSIS — E538 Deficiency of other specified B group vitamins: Secondary | ICD-10-CM | POA: Diagnosis not present

## 2022-03-08 DIAGNOSIS — Z48815 Encounter for surgical aftercare following surgery on the digestive system: Secondary | ICD-10-CM | POA: Diagnosis not present

## 2022-03-08 DIAGNOSIS — B179 Acute viral hepatitis, unspecified: Secondary | ICD-10-CM | POA: Diagnosis not present

## 2022-03-08 DIAGNOSIS — I4819 Other persistent atrial fibrillation: Secondary | ICD-10-CM | POA: Diagnosis not present

## 2022-03-15 DIAGNOSIS — B179 Acute viral hepatitis, unspecified: Secondary | ICD-10-CM | POA: Diagnosis not present

## 2022-03-15 DIAGNOSIS — Z48815 Encounter for surgical aftercare following surgery on the digestive system: Secondary | ICD-10-CM | POA: Diagnosis not present

## 2022-03-15 DIAGNOSIS — I110 Hypertensive heart disease with heart failure: Secondary | ICD-10-CM | POA: Diagnosis not present

## 2022-03-15 DIAGNOSIS — I4819 Other persistent atrial fibrillation: Secondary | ICD-10-CM | POA: Diagnosis not present

## 2022-03-15 DIAGNOSIS — I509 Heart failure, unspecified: Secondary | ICD-10-CM | POA: Diagnosis not present

## 2022-03-15 DIAGNOSIS — E538 Deficiency of other specified B group vitamins: Secondary | ICD-10-CM | POA: Diagnosis not present

## 2022-03-16 DIAGNOSIS — E538 Deficiency of other specified B group vitamins: Secondary | ICD-10-CM | POA: Diagnosis not present

## 2022-03-16 DIAGNOSIS — Z87891 Personal history of nicotine dependence: Secondary | ICD-10-CM | POA: Diagnosis not present

## 2022-03-16 DIAGNOSIS — B179 Acute viral hepatitis, unspecified: Secondary | ICD-10-CM | POA: Diagnosis not present

## 2022-03-16 DIAGNOSIS — F0283 Dementia in other diseases classified elsewhere, unspecified severity, with mood disturbance: Secondary | ICD-10-CM | POA: Diagnosis not present

## 2022-03-16 DIAGNOSIS — I110 Hypertensive heart disease with heart failure: Secondary | ICD-10-CM | POA: Diagnosis not present

## 2022-03-16 DIAGNOSIS — F32A Depression, unspecified: Secondary | ICD-10-CM | POA: Diagnosis not present

## 2022-03-16 DIAGNOSIS — Z7901 Long term (current) use of anticoagulants: Secondary | ICD-10-CM | POA: Diagnosis not present

## 2022-03-16 DIAGNOSIS — I509 Heart failure, unspecified: Secondary | ICD-10-CM | POA: Diagnosis not present

## 2022-03-16 DIAGNOSIS — F0284 Dementia in other diseases classified elsewhere, unspecified severity, with anxiety: Secondary | ICD-10-CM | POA: Diagnosis not present

## 2022-03-16 DIAGNOSIS — Z48815 Encounter for surgical aftercare following surgery on the digestive system: Secondary | ICD-10-CM | POA: Diagnosis not present

## 2022-03-16 DIAGNOSIS — G9341 Metabolic encephalopathy: Secondary | ICD-10-CM | POA: Diagnosis not present

## 2022-03-16 DIAGNOSIS — I4819 Other persistent atrial fibrillation: Secondary | ICD-10-CM | POA: Diagnosis not present

## 2022-03-17 DIAGNOSIS — I700 Atherosclerosis of aorta: Secondary | ICD-10-CM | POA: Diagnosis not present

## 2022-03-17 DIAGNOSIS — I480 Paroxysmal atrial fibrillation: Secondary | ICD-10-CM | POA: Diagnosis not present

## 2022-03-17 DIAGNOSIS — G301 Alzheimer's disease with late onset: Secondary | ICD-10-CM | POA: Diagnosis not present

## 2022-03-17 DIAGNOSIS — I129 Hypertensive chronic kidney disease with stage 1 through stage 4 chronic kidney disease, or unspecified chronic kidney disease: Secondary | ICD-10-CM | POA: Diagnosis not present

## 2022-03-17 DIAGNOSIS — F0280 Dementia in other diseases classified elsewhere without behavioral disturbance: Secondary | ICD-10-CM | POA: Diagnosis not present

## 2022-03-17 DIAGNOSIS — D6869 Other thrombophilia: Secondary | ICD-10-CM | POA: Diagnosis not present

## 2022-03-17 DIAGNOSIS — N1832 Chronic kidney disease, stage 3b: Secondary | ICD-10-CM | POA: Diagnosis not present

## 2022-03-21 DIAGNOSIS — I110 Hypertensive heart disease with heart failure: Secondary | ICD-10-CM | POA: Diagnosis not present

## 2022-03-21 DIAGNOSIS — B179 Acute viral hepatitis, unspecified: Secondary | ICD-10-CM | POA: Diagnosis not present

## 2022-03-21 DIAGNOSIS — E538 Deficiency of other specified B group vitamins: Secondary | ICD-10-CM | POA: Diagnosis not present

## 2022-03-21 DIAGNOSIS — I509 Heart failure, unspecified: Secondary | ICD-10-CM | POA: Diagnosis not present

## 2022-03-21 DIAGNOSIS — Z48815 Encounter for surgical aftercare following surgery on the digestive system: Secondary | ICD-10-CM | POA: Diagnosis not present

## 2022-03-21 DIAGNOSIS — I4819 Other persistent atrial fibrillation: Secondary | ICD-10-CM | POA: Diagnosis not present

## 2022-04-04 ENCOUNTER — Ambulatory Visit (HOSPITAL_COMMUNITY): Payer: Medicare Other | Admitting: Nurse Practitioner

## 2022-04-21 ENCOUNTER — Ambulatory Visit
Admission: RE | Admit: 2022-04-21 | Discharge: 2022-04-21 | Disposition: A | Discharge status: Home / Self Care | Payer: Medicare Other | Source: Ambulatory Visit | Attending: Geriatric Medicine | Admitting: Geriatric Medicine

## 2022-04-21 ENCOUNTER — Other Ambulatory Visit: Payer: Self-pay | Admitting: Geriatric Medicine

## 2022-04-21 DIAGNOSIS — Z23 Encounter for immunization: Secondary | ICD-10-CM | POA: Diagnosis not present

## 2022-04-21 DIAGNOSIS — M25562 Pain in left knee: Secondary | ICD-10-CM | POA: Diagnosis not present

## 2022-04-21 DIAGNOSIS — M7989 Other specified soft tissue disorders: Secondary | ICD-10-CM | POA: Diagnosis not present

## 2022-08-28 ENCOUNTER — Ambulatory Visit (HOSPITAL_COMMUNITY)
Admission: RE | Admit: 2022-08-28 | Discharge: 2022-08-28 | Disposition: A | Discharge status: Home / Self Care | Payer: Medicare Other | Source: Ambulatory Visit | Attending: Physician Assistant | Admitting: Physician Assistant

## 2022-08-28 ENCOUNTER — Encounter (HOSPITAL_COMMUNITY): Payer: Self-pay | Admitting: Physician Assistant

## 2022-08-28 VITALS — BP 130/80 | HR 75 | Ht 64.0 in | Wt 150.0 lb

## 2022-08-28 DIAGNOSIS — I509 Heart failure, unspecified: Secondary | ICD-10-CM | POA: Insufficient documentation

## 2022-08-28 DIAGNOSIS — I4819 Other persistent atrial fibrillation: Secondary | ICD-10-CM

## 2022-08-28 DIAGNOSIS — I4892 Unspecified atrial flutter: Secondary | ICD-10-CM | POA: Diagnosis not present

## 2022-08-28 DIAGNOSIS — D6869 Other thrombophilia: Secondary | ICD-10-CM

## 2022-08-28 DIAGNOSIS — I110 Hypertensive heart disease with heart failure: Secondary | ICD-10-CM | POA: Insufficient documentation

## 2022-08-28 DIAGNOSIS — I440 Atrioventricular block, first degree: Secondary | ICD-10-CM | POA: Insufficient documentation

## 2022-08-28 DIAGNOSIS — Z7901 Long term (current) use of anticoagulants: Secondary | ICD-10-CM | POA: Insufficient documentation

## 2022-08-28 DIAGNOSIS — Z79899 Other long term (current) drug therapy: Secondary | ICD-10-CM | POA: Insufficient documentation

## 2022-08-28 LAB — CBC
HCT: 45.2 % (ref 36.0–46.0)
Hemoglobin: 14.8 g/dL (ref 12.0–15.0)
MCH: 31.8 pg (ref 26.0–34.0)
MCHC: 32.7 g/dL (ref 30.0–36.0)
MCV: 97 fL (ref 80.0–100.0)
Platelets: 214 10*3/uL (ref 150–400)
RBC: 4.66 MIL/uL (ref 3.87–5.11)
RDW: 14 % (ref 11.5–15.5)
WBC: 4.9 10*3/uL (ref 4.0–10.5)
nRBC: 0 % (ref 0.0–0.2)

## 2022-08-28 LAB — COMPREHENSIVE METABOLIC PANEL
ALT: 8 U/L (ref 0–44)
AST: 16 U/L (ref 15–41)
Albumin: 3.5 g/dL (ref 3.5–5.0)
Alkaline Phosphatase: 54 U/L (ref 38–126)
Anion gap: 8 (ref 5–15)
BUN: 21 mg/dL (ref 8–23)
CO2: 25 mmol/L (ref 22–32)
Calcium: 8.6 mg/dL — ABNORMAL LOW (ref 8.9–10.3)
Chloride: 106 mmol/L (ref 98–111)
Creatinine, Ser: 0.95 mg/dL (ref 0.44–1.00)
GFR, Estimated: 56 mL/min — ABNORMAL LOW (ref >60)
Glucose, Bld: 91 mg/dL (ref 70–99)
Potassium: 4 mmol/L (ref 3.5–5.1)
Sodium: 139 mmol/L (ref 135–145)
Total Bilirubin: 0.8 mg/dL (ref 0.3–1.2)
Total Protein: 6.4 g/dL — ABNORMAL LOW (ref 6.5–8.1)

## 2022-08-28 LAB — TSH: TSH: 3.212 u[IU]/mL (ref 0.350–4.500)

## 2022-08-28 NOTE — Progress Notes (Signed)
Primary Care Physician: Stephanie Manes, MD Referring Physician: Dr. Felipa Ritter EP: Dr. Dahlia Ritter Stephanie Ritter is a 89 y.o. female with a h/o paroxysmal afib with conversion to SR with amiodarone, which did not require a cardioversion. She is s/p hernia repair 08/07/18 and has recovered nicely. She did note one episode of bradycardia associated with some lightheadedness after her surgery. Denies syncope or falls.  F/u in afib clinic, 7/1. She has had her  meds adjusted and wore a monitor for her daughter reporting that times her reporting that she didn't feel good. Her monitor did not show any afib or significant brady/pauses. She did have some junctional rhythm, which she triggered but also triggered some SR. Today in the office, she does not report any significant issues, no weak or lightheaded spells. No falls. She thinks she is just bored and depressed for having to stay in so much with covid.   F/u in afib clinic, 05/15/19. She feels at her baseline. Feels alittle  fatigue at times but no unusual dizziness. She is in SR today.   F/u in afib clinic, 11/20/19. She  is in SR and feels well. The daughter states that she has had a good 6 months. She has had her covid shots. No bleeding  Issues with eliquis, CHA2DS2VASc of 5.   F/u in afib clinic, 05/25/20. She is in the afib clinic for f/u. She remains in SR. She has had an uneventful 6 months. Continues  on amiodarone 100 mg daily and eliquis  5 mg bid. Her labs were last scheduled with StephanieStoneking in August.  Will request for review.   F/u in afib clinic, 11/23/20. She reports that she is doing well staying in [place]. Continues  on low dose amiodarone at 100 mg daily. No issues with eliquis. Had labs (reviewed) from  PCP recently and stable. No shortness of breath or cough.   F/u in afib clinic, 03/02/22. Daughter wanted her to be seen as her BP has been running lower sine GB surgery 3 weeks ago.noted by PT coming into the home. She has lost  around 15 lbs since I saw her  last. Appetite has been reduced since surgery. No afib to report. Will try to reduce cardizem to allow more BP.   Follow up in the AF clinic 08/28/22. Patient and family report that she has done well since her last visit. No known issues with afib in the interim. No bleeding issues on anticoagulation.   Today, she denies symptoms of palpitations, chest pain, shortness of breath, orthopnea, PND, lower extremity edema, presyncope, syncope, or neurologic sequela.  The patient is tolerating medications without difficulties and is otherwise without complaint today.   Past Medical History:  Diagnosis Date   CHF (congestive heart failure) (HCC)    Hypertension    Persistent atrial fibrillation (HCC)    Tinnitus    Past Surgical History:  Procedure Laterality Date   APPENDECTOMY     60 years ago   ATRIAL FIBRILLATION ABLATION N/A    8 years ago   BOWEL RESECTION N/A 08/07/2018   Procedure: SMALL BOWEL RESECTION;  Surgeon: Stephanie Riley, MD;  Location: [facility];  Service: General;  Laterality: N/A;   CHOLECYSTECTOMY N/A 02/09/2022   Procedure: LAPAROSCOPIC CHOLECYSTECTOMY;  Surgeon: Stephanie Luna, MD;  Location: [facility];  Service: General;  Laterality: N/A;   ERCP N/A 02/05/2022   Procedure: ENDOSCOPIC RETROGRADE CHOLANGIOPANCREATOGRAPHY (ERCP);  Surgeon: Stephanie Mayer, MD;  Location: [facility];  Service:  Gastroenterology;  Laterality: N/A;   hip replacement Bilateral    4 and 8 years ago   [place] Right 08/07/2018   Procedure: Laparoscopic right femoral hernia repair with mesh, possible open, possible bowel resection;  Surgeon: Stephanie Riley, MD;  Location: [facility];  Service: General;  Laterality: Right;   REMOVAL OF STONES  02/05/2022   Procedure: REMOVAL OF STONES;  Surgeon: Stephanie Mayer, MD;  Location: [facility];  Service: Gastroenterology;;   Stephanie Ritter  02/05/2022   Procedure: Stephanie Ritter;  Surgeon: Stephanie Mayer, MD;  Location:  [facility];  Service: Gastroenterology;;    Current Outpatient Medications  Medication Sig Dispense Refill   acetaminophen (TYLENOL) 500 MG tablet Take 1,000 mg by mouth daily as needed (for pain).     amiodarone (PACERONE) 200 MG tablet Take 0.5 tablets (100 mg total) by mouth every Monday, Wednesday, and Friday. 45 tablet 3   Carboxymethylcellulose Sodium (EYE DROPS OP) Place 2 drops into both eyes as needed (for dry eyes).      Cholecalciferol (VITAMIN D3) 25 MCG (1000 UT) CAPS Take 1,000 Units by mouth See admin instructions. Take 1 capsule by mouth four times a week     Cyanocobalamin (VITAMIN B12) 1000 MCG TBCR Take 1,000 mcg by mouth daily.     diltiazem (CARDIZEM CD) 240 MG 24 hr capsule Take 1 capsule (240 mg total) by mouth daily. 30 capsule 6   ELIQUIS 5 MG TABS tablet TAKE 1 TABLET BY MOUTH TWICE  DAILY 180 tablet 3   escitalopram (LEXAPRO) 20 MG tablet Take 20 mg by mouth daily.   11   ipratropium (ATROVENT) 0.03 % nasal spray Place 1 spray into both nostrils as needed.     Multiple Vitamins-Minerals (PRESERVISION/LUTEIN) CAPS Take 1 capsule by mouth 2 (two) times daily.      MYRBETRIQ 25 MG TB24 tablet Take 25 mg by mouth daily.     polyethylene glycol (MIRALAX / GLYCOLAX) 17 g packet Take 17 g by mouth daily as needed for mild constipation. 14 each 0   No current facility-administered medications for this encounter.    Allergies  Allergen Reactions   Asa [Aspirin] Other (See Comments)    Excessive bleeding   Nsaids Other (See Comments)    Bleeding, ulcers   Mirtazapine Other (See Comments)    Very groggy    Social History   Socioeconomic History   Marital status: Married    Spouse name: Not on file   Number of children: Not on file   Years of education: Not on file   Highest education level: Not on file  Occupational History   Not on file  Tobacco Use   Smoking status: Former    Types: Cigarettes   Smokeless tobacco: Never   Tobacco comments:    Former  smoker 08/28/22  Vaping Use   Vaping Use: Never used  Substance and Sexual Activity   Alcohol use: Yes    Alcohol/week: 1.0 standard drink of alcohol    Types: 1 Glasses of wine per week    Comment: occasionally   Drug use: No   Sexual activity: Not on file  Other Topics Concern   Not on file  Social History Narrative   Not on file   Social Determinants of Health   Financial Resource Strain: Not on file  Food Insecurity: Not on file  Transportation Needs: Not on file  Physical Activity: Not on file  Stress: Not on file  Social Connections:  Not on file  Intimate Partner Violence: Not on file    Family History  Family history unknown: Yes    ROS- All systems are reviewed and negative except as per the HPI above  Physical Exam: Vitals:   08/28/22 1541  BP: 130/80  Pulse: 75  Weight: 68 kg  Height: '5\' 4"'$  (1.626 m)   Wt Readings from Last 3 Encounters:  08/28/22 68 kg  03/02/22 64.5 kg  02/09/22 73.4 kg    Labs: Lab Results  Component Value Date   NA 139 02/10/2022   K 4.4 02/10/2022   CL 106 02/10/2022   CO2 25 02/10/2022   GLUCOSE 134 (H) 02/10/2022   BUN 20 02/10/2022   CREATININE 0.79 02/10/2022   CALCIUM 8.1 (L) 02/10/2022   MG 1.9 02/08/2022    GEN- The patient is a well appearing elderly female, alert and oriented x 3 today.   HEENT-head normocephalic, atraumatic, sclera clear, conjunctiva pink, hearing intact, trachea midline. Lungs- Clear to ausculation bilaterally, normal work of breathing Heart- Regular rate and rhythm, no murmurs, rubs or gallops  GI- soft, NT, ND, + BS Extremities- no clubbing, cyanosis, or edema MS- no significant deformity or atrophy Skin- no rash or lesion Psych- euthymic mood, full affect Neuro- strength and sensation are intact   EKG-   SR, 1st degree AV block, slow R wave prog Vent. rate 75 BPM PR interval 224 ms QRS duration 102 ms QT/QTcB 422/471 ms   CHA2DS2-VASc Score = 5  The patient's score is based  upon: CHF History: 1 HTN History: 1 Diabetes History: 0 Stroke History: 0 Vascular Disease History: 0 Age Score: 2 Gender Score: 1       ASSESSMENT AND PLAN: 1. Persistent Atrial Fibrillation/atrial flutter The patient's CHA2DS2-VASc score is 5, indicating a 7.2% annual risk of stroke.   Patient appears to be maintaining SR. Continue amiodarone 100 mg M/W/F Continue Eliquis 5 mg BID Continue diltiazem 240 mg daily  2. Secondary Hypercoagulable State (ICD10:  D68.69) The patient is at significant risk for stroke/thromboembolism based upon her CHA2DS2-VASc Score of 5.  Continue Apixaban (Eliquis).   3. HTN Stable, no changes today.   Follow up in the AF clinic in 6 months.    [facility] [address] [place], [place] [address] [phone]

## 2022-09-07 ENCOUNTER — Encounter (HOSPITAL_COMMUNITY): Payer: Self-pay | Admitting: *Deleted

## 2022-10-12 ENCOUNTER — Other Ambulatory Visit (HOSPITAL_COMMUNITY): Payer: Self-pay | Admitting: Nurse Practitioner

## 2022-10-20 DIAGNOSIS — N390 Urinary tract infection, site not specified: Secondary | ICD-10-CM | POA: Diagnosis not present

## 2022-11-17 DIAGNOSIS — G250 Essential tremor: Secondary | ICD-10-CM | POA: Diagnosis not present

## 2022-11-17 DIAGNOSIS — I700 Atherosclerosis of aorta: Secondary | ICD-10-CM | POA: Diagnosis not present

## 2022-11-17 DIAGNOSIS — I480 Paroxysmal atrial fibrillation: Secondary | ICD-10-CM | POA: Diagnosis not present

## 2022-11-17 DIAGNOSIS — Z1331 Encounter for screening for depression: Secondary | ICD-10-CM | POA: Diagnosis not present

## 2022-11-17 DIAGNOSIS — D6869 Other thrombophilia: Secondary | ICD-10-CM | POA: Diagnosis not present

## 2022-11-17 DIAGNOSIS — N1832 Chronic kidney disease, stage 3b: Secondary | ICD-10-CM | POA: Diagnosis not present

## 2022-11-17 DIAGNOSIS — G301 Alzheimer's disease with late onset: Secondary | ICD-10-CM | POA: Diagnosis not present

## 2022-11-17 DIAGNOSIS — Z79899 Other long term (current) drug therapy: Secondary | ICD-10-CM | POA: Diagnosis not present

## 2022-11-17 DIAGNOSIS — I129 Hypertensive chronic kidney disease with stage 1 through stage 4 chronic kidney disease, or unspecified chronic kidney disease: Secondary | ICD-10-CM | POA: Diagnosis not present

## 2022-11-17 DIAGNOSIS — F331 Major depressive disorder, recurrent, moderate: Secondary | ICD-10-CM | POA: Diagnosis not present

## 2022-11-17 DIAGNOSIS — Z0000 Encounter for general adult medical examination without abnormal findings: Secondary | ICD-10-CM | POA: Diagnosis not present

## 2022-11-17 DIAGNOSIS — E538 Deficiency of other specified B group vitamins: Secondary | ICD-10-CM | POA: Diagnosis not present

## 2022-12-18 DIAGNOSIS — L821 Other seborrheic keratosis: Secondary | ICD-10-CM | POA: Diagnosis not present

## 2022-12-18 DIAGNOSIS — D692 Other nonthrombocytopenic purpura: Secondary | ICD-10-CM | POA: Diagnosis not present

## 2022-12-18 DIAGNOSIS — L570 Actinic keratosis: Secondary | ICD-10-CM | POA: Diagnosis not present

## 2022-12-18 DIAGNOSIS — D1801 Hemangioma of skin and subcutaneous tissue: Secondary | ICD-10-CM | POA: Diagnosis not present

## 2022-12-18 DIAGNOSIS — D2272 Melanocytic nevi of left lower limb, including hip: Secondary | ICD-10-CM | POA: Diagnosis not present

## 2022-12-18 DIAGNOSIS — Z85828 Personal history of other malignant neoplasm of skin: Secondary | ICD-10-CM | POA: Diagnosis not present

## 2022-12-18 DIAGNOSIS — D225 Melanocytic nevi of trunk: Secondary | ICD-10-CM | POA: Diagnosis not present

## 2022-12-18 DIAGNOSIS — D224 Melanocytic nevi of scalp and neck: Secondary | ICD-10-CM | POA: Diagnosis not present

## 2022-12-18 DIAGNOSIS — D485 Neoplasm of uncertain behavior of skin: Secondary | ICD-10-CM | POA: Diagnosis not present

## 2022-12-18 DIAGNOSIS — D2262 Melanocytic nevi of left upper limb, including shoulder: Secondary | ICD-10-CM | POA: Diagnosis not present

## 2023-02-06 DIAGNOSIS — G4710 Hypersomnia, unspecified: Secondary | ICD-10-CM | POA: Diagnosis not present

## 2023-02-06 DIAGNOSIS — R5382 Chronic fatigue, unspecified: Secondary | ICD-10-CM | POA: Diagnosis not present

## 2023-02-26 ENCOUNTER — Ambulatory Visit (HOSPITAL_COMMUNITY)
Admission: RE | Admit: 2023-02-26 | Discharge: 2023-02-26 | Disposition: A | Discharge status: Home / Self Care | Payer: Medicare Other | Source: Ambulatory Visit | Attending: Physician Assistant | Admitting: Physician Assistant

## 2023-02-26 ENCOUNTER — Encounter (HOSPITAL_COMMUNITY): Payer: Self-pay | Admitting: Physician Assistant

## 2023-02-26 VITALS — BP 128/74 | HR 80 | Ht 64.0 in | Wt 145.2 lb

## 2023-02-26 DIAGNOSIS — Z79899 Other long term (current) drug therapy: Secondary | ICD-10-CM

## 2023-02-26 DIAGNOSIS — D6869 Other thrombophilia: Secondary | ICD-10-CM

## 2023-02-26 DIAGNOSIS — G4733 Obstructive sleep apnea (adult) (pediatric): Secondary | ICD-10-CM | POA: Insufficient documentation

## 2023-02-26 DIAGNOSIS — I509 Heart failure, unspecified: Secondary | ICD-10-CM | POA: Insufficient documentation

## 2023-02-26 DIAGNOSIS — Z5181 Encounter for therapeutic drug level monitoring: Secondary | ICD-10-CM

## 2023-02-26 DIAGNOSIS — Z7901 Long term (current) use of anticoagulants: Secondary | ICD-10-CM | POA: Insufficient documentation

## 2023-02-26 DIAGNOSIS — I4892 Unspecified atrial flutter: Secondary | ICD-10-CM | POA: Diagnosis not present

## 2023-02-26 DIAGNOSIS — I110 Hypertensive heart disease with heart failure: Secondary | ICD-10-CM | POA: Insufficient documentation

## 2023-02-26 DIAGNOSIS — I4819 Other persistent atrial fibrillation: Secondary | ICD-10-CM

## 2023-02-26 NOTE — Progress Notes (Signed)
Primary Care Physician: Merlene Laughter, MD (Inactive) Referring Physician: Dr. Pete Glatter EP: Dr. Johnston Ebbs Stephanie Ritter is a 89 y.o. female with a h/o paroxysmal afib with conversion to SR with amiodarone, which did not require a cardioversion. She is s/p hernia repair 08/07/18 and has recovered nicely. She did note one episode of bradycardia associated with some lightheadedness after her surgery. Denies syncope or falls.  F/u in afib clinic, 7/1. She has had her  meds adjusted and wore a monitor for her daughter reporting that times her reporting that she didn't feel good. Her monitor did not show any afib or significant brady/pauses. She did have some junctional rhythm, which she triggered but also triggered some SR. Today in the office, she does not report any significant issues, no weak or lightheaded spells. No falls. She thinks she is just bored and depressed for having to stay in so much with covid.   F/u in afib clinic, 05/15/19. She feels at her baseline. Feels alittle  fatigue at times but no unusual dizziness. She is in SR today.   F/u in afib clinic, 11/20/19. She  is in SR and feels well. The daughter states that she has had a good 6 months. She has had her covid shots. No bleeding  Issues with eliquis, CHA2DS2VASc of 5.   F/u in afib clinic, 05/25/20. She is in the afib clinic for f/u. She remains in SR. She has had an uneventful 6 months. Continues  on amiodarone 100 mg daily and eliquis  5 mg bid. Her labs were last scheduled with Dr.Stoneking in August.  Will request for review.   F/u in afib clinic, 11/23/20. She reports that she is doing well staying in SR. Continues  on low dose amiodarone at 100 mg daily. No issues with eliquis. Had labs (reviewed) from  PCP recently and stable. No shortness of breath or cough.   F/u in afib clinic, 03/02/22. Daughter wanted her to be seen as her BP has been running lower sine GB surgery 3 weeks ago.noted by PT coming into the home. She  has lost around 15 lbs since I saw her  last. Appetite has been reduced since surgery. No afib to report. Will try to reduce cardizem to allow more BP.   Follow up in the AF clinic 08/28/22. Patient and family report that she has done well since her last visit. No known issues with afib in the interim. No bleeding issues on anticoagulation.   Follow up in the AF clinic 02/26/23. Patient reports that she feels well today. Her family has noticed that she has been more sleepy over the past few months. She had lab work with her PCP 02/06/23 which was unremarkable (reviewed in care everywhere). Patient does have OSA and declines CPAP due to mask intolerance.   Today, she denies symptoms of palpitations, chest pain, shortness of breath, orthopnea, PND, lower extremity edema, presyncope, syncope, or neurologic sequela.  The patient is tolerating medications without difficulties and is otherwise without complaint today.   Past Medical History:  Diagnosis Date   CHF (congestive heart failure) (HCC)    Hypertension    Persistent atrial fibrillation (HCC)    Tinnitus     ROS- All systems are reviewed and negative except as per the HPI above  Physical Exam: Vitals:   02/26/23 1534  BP: 128/74  Pulse: 80  Weight: 65.9 kg  Height: 5\' 4"  (1.626 m)    Wt Readings from Last 3 Encounters:  02/26/23 65.9 kg  08/28/22 68 kg  03/02/22 64.5 kg    GEN: Well nourished, well developed in no acute distress NECK: No JVD; No carotid bruits CARDIAC: Regular rate and rhythm, no murmurs, rubs, gallops RESPIRATORY:  Clear to auscultation without rales, wheezing or rhonchi  ABDOMEN: Soft, non-tender, non-distended EXTREMITIES:  No edema; No deformity    EKG today demonstrates SR, 1st degree AV block Vent. rate 80 BPM PR interval 218 ms QRS duration 102 ms QT/QTcB 408/470 ms   CHA2DS2-VASc Score = 5  The patient's score is based upon: CHF History: 1 HTN History: 1 Diabetes History: 0 Stroke History:  0 Vascular Disease History: 0 Age Score: 2 Gender Score: 1       ASSESSMENT AND PLAN: Persistent Atrial Fibrillation/atrial flutter The patient's CHA2DS2-VASc score is 5, indicating a 7.2% annual risk of stroke.   Patient appears to be maintaining SR Continue amiodarone 100 mg M/W/F. Recent Cmet/TSH reviewed.  Continue Eliquis 5 mg BID Continue diltiazem 240 mg daily  Secondary Hypercoagulable State (ICD10:  D68.69) The patient is at significant risk for stroke/thromboembolism based upon her CHA2DS2-VASc Score of 5.  Continue Apixaban (Eliquis).   HTN Stable on current regimen  OSA  Patient declines CPAP, mask intolerance.    Follow up in the AF clinic in 6 months.    Jorja Loa PA-C [facility] [address] [place], [place] [address] [phone]

## 2023-05-18 DIAGNOSIS — G301 Alzheimer's disease with late onset: Secondary | ICD-10-CM | POA: Diagnosis not present

## 2023-05-18 DIAGNOSIS — G4733 Obstructive sleep apnea (adult) (pediatric): Secondary | ICD-10-CM | POA: Diagnosis not present

## 2023-05-18 DIAGNOSIS — I480 Paroxysmal atrial fibrillation: Secondary | ICD-10-CM | POA: Diagnosis not present

## 2023-05-18 DIAGNOSIS — F331 Major depressive disorder, recurrent, moderate: Secondary | ICD-10-CM | POA: Diagnosis not present

## 2023-05-18 DIAGNOSIS — Z23 Encounter for immunization: Secondary | ICD-10-CM | POA: Diagnosis not present

## 2023-08-29 ENCOUNTER — Encounter (HOSPITAL_COMMUNITY): Payer: Self-pay | Admitting: Physician Assistant

## 2023-08-29 ENCOUNTER — Ambulatory Visit (HOSPITAL_COMMUNITY)
Admission: RE | Admit: 2023-08-29 | Discharge: 2023-08-29 | Disposition: A | Discharge status: Home / Self Care | Payer: Medicare Other | Source: Ambulatory Visit | Attending: Physician Assistant | Admitting: Physician Assistant

## 2023-08-29 VITALS — BP 128/80 | HR 80 | Ht 64.0 in | Wt 144.0 lb

## 2023-08-29 DIAGNOSIS — Z7901 Long term (current) use of anticoagulants: Secondary | ICD-10-CM | POA: Diagnosis not present

## 2023-08-29 DIAGNOSIS — I509 Heart failure, unspecified: Secondary | ICD-10-CM | POA: Diagnosis not present

## 2023-08-29 DIAGNOSIS — G4733 Obstructive sleep apnea (adult) (pediatric): Secondary | ICD-10-CM | POA: Insufficient documentation

## 2023-08-29 DIAGNOSIS — D6869 Other thrombophilia: Secondary | ICD-10-CM | POA: Insufficient documentation

## 2023-08-29 DIAGNOSIS — I110 Hypertensive heart disease with heart failure: Secondary | ICD-10-CM | POA: Diagnosis not present

## 2023-08-29 DIAGNOSIS — Z5181 Encounter for therapeutic drug level monitoring: Secondary | ICD-10-CM | POA: Insufficient documentation

## 2023-08-29 DIAGNOSIS — Z79899 Other long term (current) drug therapy: Secondary | ICD-10-CM | POA: Insufficient documentation

## 2023-08-29 DIAGNOSIS — I4892 Unspecified atrial flutter: Secondary | ICD-10-CM | POA: Insufficient documentation

## 2023-08-29 DIAGNOSIS — I4819 Other persistent atrial fibrillation: Secondary | ICD-10-CM | POA: Insufficient documentation

## 2023-08-29 NOTE — Progress Notes (Signed)
Primary Care Physician: Merlene Laughter, MD (Inactive) Referring Physician: Dr. Pete Glatter EP: Dr. Johnston Ebbs Stephanie Ritter is a 89 y.o. female with a h/o paroxysmal afib with conversion to SR with amiodarone, which did not require a cardioversion. She is s/p hernia repair 08/07/18 and has recovered nicely. She did note one episode of bradycardia associated with some lightheadedness after her surgery. Denies syncope or falls.  F/u in afib clinic, 7/1. She has had her  meds adjusted and wore a monitor for her daughter reporting that times her reporting that she didn't feel good. Her monitor did not show any afib or significant brady/pauses. She did have some junctional rhythm, which she triggered but also triggered some SR. Today in the office, she does not report any significant issues, no weak or lightheaded spells. No falls. She thinks she is just bored and depressed for having to stay in so much with covid.   F/u in afib clinic, 05/15/19. She feels at her baseline. Feels alittle  fatigue at times but no unusual dizziness. She is in SR today.   F/u in afib clinic, 11/20/19. She  is in SR and feels well. The daughter states that she has had a good 6 months. She has had her covid shots. No bleeding  Issues with eliquis, CHA2DS2VASc of 5.   F/u in afib clinic, 05/25/20. She is in the afib clinic for f/u. She remains in SR. She has had an uneventful 6 months. Continues  on amiodarone 100 mg daily and eliquis  5 mg bid. Her labs were last scheduled with Dr.Stoneking in August.  Will request for review.   F/u in afib clinic, 11/23/20. She reports that she is doing well staying in SR. Continues  on low dose amiodarone at 100 mg daily. No issues with eliquis. Had labs (reviewed) from  PCP recently and stable. No shortness of breath or cough.   F/u in afib clinic, 03/02/22. Daughter wanted her to be seen as her BP has been running lower sine GB surgery 3 weeks ago.noted by PT coming into the home. She  has lost around 15 lbs since I saw her  last. Appetite has been reduced since surgery. No afib to report. Will try to reduce cardizem to allow more BP.   Follow up in the AF clinic 08/28/22. Patient and family report that she has done well since her last visit. No known issues with afib in the interim. No bleeding issues on anticoagulation.   Follow up in the AF clinic 02/26/23. Patient reports that she feels well today. Her family has noticed that she has been more sleepy over the past few months. She had lab work with her PCP 02/06/23 which was unremarkable (reviewed in care everywhere). Patient does have OSA and declines CPAP due to mask intolerance.   Follow up in the AF clinic 08/29/23. Patient returns for follow up for atrial fibrillation and amiodarone monitoring. Her family member provides most of the history today due to the patient's h/o Alzheimer's dementia. She has done well since her last visit with no interim symptoms of afib. No bleeding issues on anticoagulation.   Today, he denies symptoms of palpitations, chest pain, orthopnea, PND, lower extremity edema, dizziness, presyncope, syncope, snoring, daytime somnolence, bleeding, or neurologic sequela. The patient is tolerating medications without difficulties and is otherwise without complaint today.    Past Medical History:  Diagnosis Date   CHF (congestive heart failure) (HCC)    Hypertension    Persistent atrial  fibrillation (HCC)    Tinnitus     ROS- All systems are reviewed and negative except as per the HPI above  Physical Exam: Vitals:   08/29/23 1533  BP: 128/80  Pulse: 80  Weight: 65.3 kg  Height: 5\' 4"  (1.626 m)     Wt Readings from Last 3 Encounters:  08/29/23 65.3 kg  02/26/23 65.9 kg  08/28/22 68 kg    GEN: Well nourished, well developed in no acute distress CARDIAC: Regular rate and rhythm, no murmurs, rubs, gallops RESPIRATORY:  Clear to auscultation without rales, wheezing or rhonchi  ABDOMEN: Soft,  non-tender, non-distended EXTREMITIES:  No edema; No deformity    EKG today demonstrates SR, 1st degree AV block Vent. rate 80 BPM PR interval 214 ms QRS duration 102 ms QT/QTcB 394/454 ms   CHA2DS2-VASc Score = 5  The patient's score is based upon: CHF History: 1 HTN History: 1 Diabetes History: 0 Stroke History: 0 Vascular Disease History: 0 Age Score: 2 Gender Score: 1       ASSESSMENT AND PLAN: Persistent Atrial Fibrillation/atrial flutter The patient's CHA2DS2-VASc score is 5, indicating a 7.2% annual risk of stroke.   Patient appears to be maintaining SR Continue amiodarone 100 mg M/W/F Continue Eliquis 5 mg BID Continue diltiazem 240 mg daily  Secondary Hypercoagulable State (ICD10:  D68.69) The patient is at significant risk for stroke/thromboembolism based upon her CHA2DS2-VASc Score of 5.  Continue Apixaban (Eliquis).   High Risk Medication Monitoring (ICD 10: Z79.899) Intervals on ECG appropriate for amiodarone monitoring Will check cmet/TSH at next follow up.    HTN Stable on current regimen  OSA  Patient not on CPAP due to mask intolerance.    Follow up in the AF clinic in 6 months.    Jorja Loa PA-C [facility] [address] [place], [place] [address] [phone]

## 2023-11-28 DIAGNOSIS — Z111 Encounter for screening for respiratory tuberculosis: Secondary | ICD-10-CM | POA: Diagnosis not present

## 2023-12-17 DIAGNOSIS — F339 Major depressive disorder, recurrent, unspecified: Secondary | ICD-10-CM | POA: Diagnosis not present

## 2023-12-17 DIAGNOSIS — I10 Essential (primary) hypertension: Secondary | ICD-10-CM | POA: Diagnosis not present

## 2023-12-17 DIAGNOSIS — D519 Vitamin B12 deficiency anemia, unspecified: Secondary | ICD-10-CM | POA: Diagnosis not present

## 2023-12-17 DIAGNOSIS — G309 Alzheimer's disease, unspecified: Secondary | ICD-10-CM | POA: Diagnosis not present

## 2023-12-17 DIAGNOSIS — K649 Unspecified hemorrhoids: Secondary | ICD-10-CM | POA: Diagnosis not present

## 2023-12-17 DIAGNOSIS — I4892 Unspecified atrial flutter: Secondary | ICD-10-CM | POA: Diagnosis not present

## 2023-12-17 DIAGNOSIS — E559 Vitamin D deficiency, unspecified: Secondary | ICD-10-CM | POA: Diagnosis not present

## 2023-12-17 DIAGNOSIS — N3281 Overactive bladder: Secondary | ICD-10-CM | POA: Diagnosis not present

## 2023-12-17 DIAGNOSIS — M150 Primary generalized (osteo)arthritis: Secondary | ICD-10-CM | POA: Diagnosis not present

## 2024-01-21 DIAGNOSIS — E559 Vitamin D deficiency, unspecified: Secondary | ICD-10-CM | POA: Diagnosis not present

## 2024-01-21 DIAGNOSIS — I10 Essential (primary) hypertension: Secondary | ICD-10-CM | POA: Diagnosis not present

## 2024-01-21 DIAGNOSIS — G309 Alzheimer's disease, unspecified: Secondary | ICD-10-CM | POA: Diagnosis not present

## 2024-01-24 DIAGNOSIS — D519 Vitamin B12 deficiency anemia, unspecified: Secondary | ICD-10-CM | POA: Diagnosis not present

## 2024-01-24 DIAGNOSIS — I10 Essential (primary) hypertension: Secondary | ICD-10-CM | POA: Diagnosis not present

## 2024-01-24 DIAGNOSIS — E559 Vitamin D deficiency, unspecified: Secondary | ICD-10-CM | POA: Diagnosis not present

## 2024-02-18 DIAGNOSIS — E559 Vitamin D deficiency, unspecified: Secondary | ICD-10-CM | POA: Diagnosis not present

## 2024-02-18 DIAGNOSIS — D519 Vitamin B12 deficiency anemia, unspecified: Secondary | ICD-10-CM | POA: Diagnosis not present

## 2024-02-18 DIAGNOSIS — G309 Alzheimer's disease, unspecified: Secondary | ICD-10-CM | POA: Diagnosis not present

## 2024-02-25 ENCOUNTER — Ambulatory Visit (HOSPITAL_COMMUNITY): Payer: Medicare Other | Admitting: Physician Assistant

## 2024-02-25 DIAGNOSIS — I4892 Unspecified atrial flutter: Secondary | ICD-10-CM | POA: Diagnosis not present

## 2024-02-25 DIAGNOSIS — G309 Alzheimer's disease, unspecified: Secondary | ICD-10-CM | POA: Diagnosis not present

## 2024-03-06 DIAGNOSIS — G309 Alzheimer's disease, unspecified: Secondary | ICD-10-CM | POA: Diagnosis not present

## 2024-03-06 DIAGNOSIS — D519 Vitamin B12 deficiency anemia, unspecified: Secondary | ICD-10-CM | POA: Diagnosis not present

## 2024-03-06 DIAGNOSIS — E559 Vitamin D deficiency, unspecified: Secondary | ICD-10-CM | POA: Diagnosis not present

## 2024-03-14 ENCOUNTER — Telehealth (HOSPITAL_COMMUNITY): Payer: Self-pay | Admitting: *Deleted

## 2024-03-14 NOTE — Telephone Encounter (Addendum)
-----   Message from Rollene RAMAN sent at 03/13/2024  1:00 PM EDT ----- Regarding: advise Her daughter just called and left a message and said her mom was moved to  a facility, she wanted to know since she was seeing a doctor there, can they just send us  her information   Discussed with Daril Kicks PA - states patient can be managed by care facility MD. Daughter in agreement and will follow up in AFib Clinic as needed.

## 2024-03-17 DIAGNOSIS — I4892 Unspecified atrial flutter: Secondary | ICD-10-CM | POA: Diagnosis not present

## 2024-03-17 DIAGNOSIS — N3281 Overactive bladder: Secondary | ICD-10-CM | POA: Diagnosis not present

## 2024-03-17 DIAGNOSIS — N182 Chronic kidney disease, stage 2 (mild): Secondary | ICD-10-CM | POA: Diagnosis not present

## 2024-03-17 DIAGNOSIS — D519 Vitamin B12 deficiency anemia, unspecified: Secondary | ICD-10-CM | POA: Diagnosis not present

## 2024-03-17 DIAGNOSIS — F0390 Unspecified dementia without behavioral disturbance: Secondary | ICD-10-CM | POA: Diagnosis not present

## 2024-03-17 DIAGNOSIS — Z0000 Encounter for general adult medical examination without abnormal findings: Secondary | ICD-10-CM | POA: Diagnosis not present

## 2024-03-17 DIAGNOSIS — R634 Abnormal weight loss: Secondary | ICD-10-CM | POA: Diagnosis not present

## 2024-03-17 DIAGNOSIS — G309 Alzheimer's disease, unspecified: Secondary | ICD-10-CM | POA: Diagnosis not present

## 2024-03-17 DIAGNOSIS — I1310 Hypertensive heart and chronic kidney disease without heart failure, with stage 1 through stage 4 chronic kidney disease, or unspecified chronic kidney disease: Secondary | ICD-10-CM | POA: Diagnosis not present

## 2024-03-18 ENCOUNTER — Ambulatory Visit (HOSPITAL_COMMUNITY): Admitting: Physician Assistant

## 2024-03-24 DIAGNOSIS — I4892 Unspecified atrial flutter: Secondary | ICD-10-CM | POA: Diagnosis not present

## 2024-03-24 DIAGNOSIS — G309 Alzheimer's disease, unspecified: Secondary | ICD-10-CM | POA: Diagnosis not present

## 2024-04-14 DIAGNOSIS — R634 Abnormal weight loss: Secondary | ICD-10-CM | POA: Diagnosis not present

## 2024-04-14 DIAGNOSIS — F0390 Unspecified dementia without behavioral disturbance: Secondary | ICD-10-CM | POA: Diagnosis not present

## 2024-04-14 DIAGNOSIS — G309 Alzheimer's disease, unspecified: Secondary | ICD-10-CM | POA: Diagnosis not present

## 2024-04-14 DIAGNOSIS — M150 Primary generalized (osteo)arthritis: Secondary | ICD-10-CM | POA: Diagnosis not present

## 2024-04-24 DIAGNOSIS — G309 Alzheimer's disease, unspecified: Secondary | ICD-10-CM | POA: Diagnosis not present

## 2024-04-24 DIAGNOSIS — I4892 Unspecified atrial flutter: Secondary | ICD-10-CM | POA: Diagnosis not present

## 2024-05-12 DIAGNOSIS — I1310 Hypertensive heart and chronic kidney disease without heart failure, with stage 1 through stage 4 chronic kidney disease, or unspecified chronic kidney disease: Secondary | ICD-10-CM | POA: Diagnosis not present

## 2024-05-12 DIAGNOSIS — R634 Abnormal weight loss: Secondary | ICD-10-CM | POA: Diagnosis not present

## 2024-05-12 DIAGNOSIS — N182 Chronic kidney disease, stage 2 (mild): Secondary | ICD-10-CM | POA: Diagnosis not present

## 2024-05-12 DIAGNOSIS — I4892 Unspecified atrial flutter: Secondary | ICD-10-CM | POA: Diagnosis not present

## 2024-05-19 DIAGNOSIS — G309 Alzheimer's disease, unspecified: Secondary | ICD-10-CM | POA: Diagnosis not present

## 2024-05-19 DIAGNOSIS — I4892 Unspecified atrial flutter: Secondary | ICD-10-CM | POA: Diagnosis not present

## 2024-06-09 DIAGNOSIS — G309 Alzheimer's disease, unspecified: Secondary | ICD-10-CM | POA: Diagnosis not present

## 2024-06-09 DIAGNOSIS — F03B0 Unspecified dementia, moderate, without behavioral disturbance, psychotic disturbance, mood disturbance, and anxiety: Secondary | ICD-10-CM | POA: Diagnosis not present

## 2024-06-09 DIAGNOSIS — R634 Abnormal weight loss: Secondary | ICD-10-CM | POA: Diagnosis not present

## 2024-06-09 DIAGNOSIS — N3281 Overactive bladder: Secondary | ICD-10-CM | POA: Diagnosis not present

## 2024-07-07 DIAGNOSIS — I1310 Hypertensive heart and chronic kidney disease without heart failure, with stage 1 through stage 4 chronic kidney disease, or unspecified chronic kidney disease: Secondary | ICD-10-CM | POA: Diagnosis not present

## 2024-07-07 DIAGNOSIS — R634 Abnormal weight loss: Secondary | ICD-10-CM | POA: Diagnosis not present

## 2024-07-07 DIAGNOSIS — E559 Vitamin D deficiency, unspecified: Secondary | ICD-10-CM | POA: Diagnosis not present

## 2024-07-07 DIAGNOSIS — I4892 Unspecified atrial flutter: Secondary | ICD-10-CM | POA: Diagnosis not present

## 2024-07-07 DIAGNOSIS — N182 Chronic kidney disease, stage 2 (mild): Secondary | ICD-10-CM | POA: Diagnosis not present
